# Patient Record
Sex: Male | Born: 1945 | Race: White | Hispanic: No | Marital: Married | Smoking: Former smoker
Health system: Southern US, Community
[De-identification: ages and names within clinical notes are randomized; demographics above are authoritative.]

## PROBLEM LIST (undated history)

## (undated) DIAGNOSIS — K224 Dyskinesia of esophagus: Secondary | ICD-10-CM

## (undated) DIAGNOSIS — E7800 Pure hypercholesterolemia, unspecified: Secondary | ICD-10-CM

## (undated) DIAGNOSIS — K760 Fatty (change of) liver, not elsewhere classified: Secondary | ICD-10-CM

## (undated) DIAGNOSIS — K229 Disease of esophagus, unspecified: Secondary | ICD-10-CM

## (undated) DIAGNOSIS — E119 Type 2 diabetes mellitus without complications: Secondary | ICD-10-CM

## (undated) DIAGNOSIS — K219 Gastro-esophageal reflux disease without esophagitis: Secondary | ICD-10-CM

## (undated) HISTORY — PX: CYSTECTOMY: SUR359

## (undated) HISTORY — DX: Dyskinesia of esophagus: K22.4

## (undated) HISTORY — DX: Pure hypercholesterolemia, unspecified: E78.00

## (undated) HISTORY — DX: Fatty (change of) liver, not elsewhere classified: K76.0

## (undated) HISTORY — DX: Type 2 diabetes mellitus without complications: E11.9

## (undated) HISTORY — DX: Disease of esophagus, unspecified: K22.9

## (undated) HISTORY — DX: Gastro-esophageal reflux disease without esophagitis: K21.9

---

## 1997-07-24 ENCOUNTER — Emergency Department (HOSPITAL_COMMUNITY): Admission: EM | Admit: 1997-07-24 | Discharge: 1997-07-24 | Payer: Self-pay | Admitting: *Deleted

## 1997-07-29 ENCOUNTER — Ambulatory Visit (HOSPITAL_COMMUNITY): Admission: RE | Admit: 1997-07-29 | Discharge: 1997-07-29 | Payer: Self-pay | Admitting: Urology

## 1997-07-30 ENCOUNTER — Emergency Department (HOSPITAL_COMMUNITY): Admission: EM | Admit: 1997-07-30 | Discharge: 1997-07-30 | Payer: Self-pay | Admitting: Emergency Medicine

## 1999-03-10 ENCOUNTER — Ambulatory Visit (HOSPITAL_COMMUNITY): Admission: RE | Admit: 1999-03-10 | Discharge: 1999-03-10 | Payer: Self-pay | Admitting: Family Medicine

## 2001-01-13 ENCOUNTER — Ambulatory Visit (HOSPITAL_COMMUNITY): Admission: RE | Admit: 2001-01-13 | Discharge: 2001-01-13 | Payer: Self-pay | Admitting: Family Medicine

## 2001-01-13 ENCOUNTER — Encounter: Payer: Self-pay | Admitting: Family Medicine

## 2005-02-12 HISTORY — PX: COLONOSCOPY: SHX174

## 2007-02-13 HISTORY — PX: HAND DEBRIDEMENT: SHX974

## 2007-03-10 ENCOUNTER — Encounter: Admission: RE | Admit: 2007-03-10 | Discharge: 2007-03-10 | Payer: Self-pay | Admitting: Otolaryngology

## 2007-07-18 ENCOUNTER — Ambulatory Visit (HOSPITAL_BASED_OUTPATIENT_CLINIC_OR_DEPARTMENT_OTHER): Admission: RE | Admit: 2007-07-18 | Discharge: 2007-07-18 | Payer: Self-pay | Admitting: Orthopedic Surgery

## 2007-07-18 ENCOUNTER — Encounter (INDEPENDENT_AMBULATORY_CARE_PROVIDER_SITE_OTHER): Payer: Self-pay | Admitting: Orthopedic Surgery

## 2010-06-27 NOTE — Op Note (Signed)
[name], [name]             ACCOUNT NO.:  [id number]   MEDICAL RECORD NO.:  [id number]          PATIENT TYPE:  AMB   LOCATION:  DSC                          FACILITY:  [facility]   PHYSICIAN:  [name]. Sypher, M.D. DATE OF BIRTH:  June 12, 1945   DATE OF PROCEDURE:  07/18/2007  DATE OF DISCHARGE:                               OPERATIVE REPORT   PREOPERATIVE DIAGNOSIS:  Painful enlarging mass, left thenar eminence.   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   OPERATION:  Excisional biopsy of vascular mass.   OPERATING SURGEON:  [name]. Sypher, MD   ASSISTANT:  Annye Rusk, PA-C   ANESTHESIA:  General by LMA.   SUPERVISING ANESTHESIOLOGIST:  Janetta Hora. 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  He was referred for  evaluation of a mass in his palm.  He had a purplish area adjacent to  the thenar eminence that was tender.   We explained that this could be a glomus tumor, a hemangioma, or some  other vascular lesion.   Given his proximity to the thenar motor branch, we recommended  excisional biopsy under general anesthesia.   After informed consent, he is brought to the operating room at this  time.   PROCEDURE:  Patrick Luna was brought to the operating room and placed  in supine position upon the operating table.   Following induction of general anesthesia by LMA technique, the left arm  was prepped with Betadine soap solution and sterilely draped.   A pneumatic tourniquet was applied to proximal left brachium.   Upon exsanguination of the left arm with an Esmarch bandage, the  arterial tourniquet was inflated to 230 mmHg.  The procedure commenced  with a 1.5-cm incision directly over the mass.  As the dermis was  parted, we immediately visualized a purplish round mass consistent with  a thrombosed vein or hemangioma.   This was  circumferentially dissected and resected with a normal segment  of vein proximally and distally.  This might have been in extension of  the vein accompanying the superficial palmar arch.   Electrocautery was used to seal the feeding segments of vein.   There were no deeper mass noted.   The wound was then repaired with intradermal 3-0 Prolene and Steri-  Strips.  There were no apparent complications.   For aftercare, Patrick Luna was provided a prescription for Vicodin 5 mg  1 p.o. q.4-6 h. p.r.n. pain.  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      [name] Sypher, M.D.  Electronically Signed     RVS/MEDQ  D:  07/18/2007  T:  07/18/2007  Job:  [id number]

## 2010-08-28 ENCOUNTER — Ambulatory Visit (INDEPENDENT_AMBULATORY_CARE_PROVIDER_SITE_OTHER): Payer: PRIVATE HEALTH INSURANCE | Admitting: Adult Health

## 2010-08-28 ENCOUNTER — Encounter: Payer: Self-pay | Admitting: Adult Health

## 2010-08-28 ENCOUNTER — Other Ambulatory Visit: Payer: Self-pay | Admitting: Adult Health

## 2010-08-28 ENCOUNTER — Ambulatory Visit (HOSPITAL_COMMUNITY)
Admission: RE | Admit: 2010-08-28 | Discharge: 2010-08-28 | Disposition: A | Discharge status: Home / Self Care | Payer: PRIVATE HEALTH INSURANCE | Source: Ambulatory Visit | Attending: Adult Health | Admitting: Adult Health

## 2010-08-28 DIAGNOSIS — R0602 Shortness of breath: Secondary | ICD-10-CM

## 2010-08-28 DIAGNOSIS — E7800 Pure hypercholesterolemia, unspecified: Secondary | ICD-10-CM

## 2010-08-28 DIAGNOSIS — R079 Chest pain, unspecified: Secondary | ICD-10-CM

## 2010-08-28 DIAGNOSIS — R0789 Other chest pain: Secondary | ICD-10-CM | POA: Insufficient documentation

## 2010-08-28 NOTE — Patient Instructions (Signed)
**Note De-Identified Patrick Luna Obfuscation** Your physician has requested that you have an echocardiogram. Echocardiography is a painless test that uses sound waves to create images of your heart. It provides your doctor with information about the size and shape of your heart and how well your heart's chambers and valves are working. This procedure takes approximately one hour. There are no restrictions for this procedure.  Your physician has requested that you have en exercise stress myoview. For further information please visit [email or web address] Please follow instruction sheet, as given.  Your physician recommends that you return for lab work in: today  A chest x-ray takes a picture of the organs and structures inside the chest, including the heart, lungs, and blood vessels. This test can show several things, including, whether the heart is enlarges; whether fluid is building up in the lungs; and whether pacemaker / defibrillator leads are still in place.  Your physician recommends that you schedule a follow-up appointment in: after tests

## 2010-08-28 NOTE — Progress Notes (Signed)
HPI:  We are seeing [name] at the kind request of Dr. Tanya Nones for complaints of chest pain with CVRF. 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  He says that he thought that his adrenalin was pretty high, heart rate was very high, and he experienced chest pain over his left chest, with some under his left axillary area.  This was transient and resolved after the situation was over and he calmed down.He has had no further complaints of recurrent chest pain since that time.  Since that time, however,he has felt more tired and fatigued. He states that climbing up stairs to his office is very tiring and he has to stop to rest at the top of the stairs before proceeding forward.  This concerned him enough to speak with his primary physician about this.  He was sent here for referral and is requested a stress test for evaluation.   In the interim, Dr. Tanya Nones completed labs to include CBC, CMET, TSH, Hgb A1C and cholesterol studies.Marland Kitchen  He was started on pravachol 40mg  and given a low carbohydrate diet.  He states that he is pretty sedentary while at work, but does need to exert himself at times in the field.  No Known Allergies  Current Outpatient Prescriptions  Medication Sig Dispense Refill  . aspirin 81 MG tablet Take 81 mg by mouth daily.        . cetirizine (ZYRTEC) 10 MG tablet Take 10 mg by mouth daily.          Past Medical History  Diagnosis Date  . Hypercholesterolemia     Newly diagnosed in July 2012.  Marland Kitchen Chronic sinusitis     Past Surgical History  Procedure Date  . No past surgeries     ROS: Review of systems complete and found to be negative unless listed above PHYSICAL EXAM BP 145/82  Pulse 80  Ht 5\' 6"  (1.676 m)  Wt 100.245 kg (221 lb)  BMI 35.67 kg/m2  SpO2  96% General: Well developed, well nourished,mildly obese, in no acute distress Head: Eyes PERRLA, No xanthomas.   Normal cephalic and atramatic  Lungs: Clear bilaterally to auscultation and percussion. Heart: HRRR S1 S2, 1/6 systolic murmur at the RSB.  Pulses are 2+ & equal.            No carotid bruit. No JVD.  No abdominal bruits. No femoral bruits. Abdomen: Bowel sounds are positive, abdomen soft and non-tender without masses or                  Hernia's noted. Msk:  Back normal, normal gait. Normal strength and tone for age. Extremities: No clubbing, cyanosis or edema.  DP +1 Neuro: Alert and oriented X 3. Psych:  Good affect, responds appropriately  EKG: NSR  78 bpm.  ASSESSMENT AND PLAN

## 2010-08-28 NOTE — Assessment & Plan Note (Signed)
Dr. Tanya Nones will follow this and he will need to be rechecked in 3 months.

## 2010-08-28 NOTE — Assessment & Plan Note (Addendum)
He has cardiovascular risk factors to include wt, mild hypertension, hypercholesterolemia. He has been seen and examined by myself and by Dr. Dietrich Pates. With symptoms of fatigue and DOE, with transient chest pain, we will plan a exercise stress test, no myoview, along with an echocardiogram. Chest X-ray will be completed along with a D-Dimer and a BNP.    He will follow-up with me or with Dr. Dietrich Pates for discussion of test results and further testing if necessary. Will request TSH results from Dr. Tanya Nones.

## 2010-08-29 LAB — BRAIN NATRIURETIC PEPTIDE: Brain Natriuretic Peptide: 11.3 pg/mL (ref 0.0–100.0)

## 2010-08-30 ENCOUNTER — Encounter: Payer: Self-pay | Admitting: *Deleted

## 2010-08-30 ENCOUNTER — Other Ambulatory Visit: Payer: Self-pay | Admitting: Cardiology

## 2010-09-04 ENCOUNTER — Ambulatory Visit (INDEPENDENT_AMBULATORY_CARE_PROVIDER_SITE_OTHER): Payer: PRIVATE HEALTH INSURANCE | Admitting: *Deleted

## 2010-09-04 ENCOUNTER — Encounter (HOSPITAL_COMMUNITY): Payer: Self-pay

## 2010-09-04 ENCOUNTER — Encounter (HOSPITAL_COMMUNITY)
Admission: RE | Admit: 2010-09-04 | Discharge: 2010-09-04 | Disposition: A | Discharge status: Home / Self Care | Payer: PRIVATE HEALTH INSURANCE | Source: Ambulatory Visit | Attending: Cardiology | Admitting: Cardiology

## 2010-09-04 ENCOUNTER — Ambulatory Visit (HOSPITAL_COMMUNITY)
Admission: RE | Admit: 2010-09-04 | Discharge: 2010-09-04 | Disposition: A | Discharge status: Home / Self Care | Payer: PRIVATE HEALTH INSURANCE | Source: Ambulatory Visit | Attending: Cardiology | Admitting: Cardiology

## 2010-09-04 DIAGNOSIS — R072 Precordial pain: Secondary | ICD-10-CM

## 2010-09-04 DIAGNOSIS — R079 Chest pain, unspecified: Secondary | ICD-10-CM | POA: Insufficient documentation

## 2010-09-04 DIAGNOSIS — R0602 Shortness of breath: Secondary | ICD-10-CM

## 2010-09-04 DIAGNOSIS — E785 Hyperlipidemia, unspecified: Secondary | ICD-10-CM | POA: Insufficient documentation

## 2010-09-04 MED ORDER — TECHNETIUM TC 99M TETROFOSMIN IV KIT
30.0000 | PACK | Freq: Once | INTRAVENOUS | Status: AC | PRN
  Administered 2010-09-04: 28 via INTRAVENOUS

## 2010-09-04 MED ORDER — TECHNETIUM TC 99M TETROFOSMIN IV KIT
10.0000 | PACK | Freq: Once | INTRAVENOUS | Status: AC | PRN
  Administered 2010-09-04: 9.9 via INTRAVENOUS

## 2010-09-04 NOTE — Progress Notes (Signed)
*  PRELIMINARY RESULTS* Echocardiogram 2D Echocardiogram has been performed.  Patrick Luna 09/04/2010, 12:14 PM

## 2010-09-04 NOTE — Progress Notes (Signed)
Stress Lab Nurses Notes - [facility]  ELDEAN NANNA 09/04/2010  Reason for doing test: Chest Pain and Dyspnea  Type of test: Stress Myoview  Nurse performing test: Parke Poisson, RN  Nuclear Medicine Tech: Lyndel Pleasure  Echo Tech: Not Applicable  MD performing test: R. Rothbart  Family MD: Karrie Doffing  Test explained and consent signed: yes  IV started: 22g jelco, Saline lock flushed, No redness or edema and Saline lock started in radiology  Symptoms: SOB  Treatment/Intervention: None  Reason test stopped: fatigue and SOB  After recovery IV was: Discontinued via X-ray tech and No redness or edema  Patient to return to Nuc. Med at : 11:20am  Patient discharged: Home  Patient's Condition upon discharge was: stable  Comments: Symptom resolved in recovery. Erskine Speed T

## 2010-09-04 NOTE — Progress Notes (Deleted)

## 2010-09-08 ENCOUNTER — Encounter: Payer: Self-pay | Admitting: Adult Health

## 2010-09-08 ENCOUNTER — Ambulatory Visit (INDEPENDENT_AMBULATORY_CARE_PROVIDER_SITE_OTHER): Payer: PRIVATE HEALTH INSURANCE | Admitting: Adult Health

## 2010-09-08 DIAGNOSIS — R079 Chest pain, unspecified: Secondary | ICD-10-CM

## 2010-09-08 NOTE — Assessment & Plan Note (Signed)
Review of stress test demonstrates: "Negative stress nuclear myocardial study revealing good exercise capacity, a normal stress EKG, normal left ventricular size, normal left ventricular systolic function and normal myocardial perfusion.  Echo:Left ventricle: The cavity size was normal. Borderline left ventricular hypertrophy. Systolic function was normal. The estimated ejection fraction was in the range of 55% to 60%. Wall motion was normal; there were no regional wall motion abnormalities. Aortic valve: Mildly calcified annulus. Trileaflet; normal thickness leaflets. Mitral valve: Calcified annulus. Atrial septum: No defect or patent foramen ovale was identified.  I have reviewed these test results with the patient and have given him copies of the reports. I have offered NTG sublingual to assist for recurrent pain which may be adrenalin induced, or coronary spasms. He refused at this time. He is to call if he changes his mind. We will see him on a prn basis only.  He will follow with Dr. Tanya Nones.

## 2010-09-08 NOTE — Patient Instructions (Signed)
Follow up as needed  Continue current medications  

## 2010-09-08 NOTE — Progress Notes (Signed)
HPI:  We are seeing [name] at the kind request of Dr. Tanya Nones for complaints of chest pain with CVRF. 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  He says that he thought that his adrenalin was pretty high, heart rate was very high, and he experienced chest pain over his left chest, with some under his left axillary area.  This was transient and resolved after the situation was over and he calmed down.He has had no further complaints of recurrent chest pain since that time.  Since that time, however,he has felt more tired and fatigued. He states that climbing up stairs to his office is very tiring and he has to stop to rest at the top of the stairs before proceeding forward.  This concerned him enough to speak with his primary physician about this.  He was sent here for referral and is requested a stress test for evaluation.   He was planned for stress test and Echo on last visit. He is here for discussion of results.  He has had no recurrence of chest pain since last visit. Fatigue remains.  Allergies  Allergen Reactions  . Ampicillin Rash    Current Outpatient Prescriptions  Medication Sig Dispense Refill  . aspirin 81 MG tablet Take 81 mg by mouth daily.        . cetirizine (ZYRTEC) 10 MG tablet Take 10 mg by mouth daily.          Past Medical History  Diagnosis Date  . Hypercholesterolemia     Newly diagnosed in July 2012.  Marland Kitchen Chronic sinusitis     Past Surgical History  Procedure Date  . No past surgeries     ROS: Review of systems complete and found to be negative unless listed above PHYSICAL EXAM BP 129/83  Pulse 66  Ht 5\' 6"  (1.676 m)  Wt 221 lb (100.245 kg)  BMI 35.67 kg/m2  SpO2 96% General: Well developed, well nourished,mildly obese, in no acute  distress Lungs: Clear bilaterally to auscultation and percussion. Heart: HRRR S1 S2, 1/6 systolic murmur at the RSB.  Pulses are 2+ & equal.            No carotid bruit. No JVD.  No abdominal bruits. No femoral bruits. Psych:  Good affect, responds appropriately    ASSESSMENT AND PLAN

## 2010-09-11 ENCOUNTER — Encounter: Payer: Self-pay | Admitting: *Deleted

## 2010-09-18 ENCOUNTER — Telehealth: Payer: Self-pay | Admitting: *Deleted

## 2010-09-18 NOTE — Telephone Encounter (Signed)
Pt had a 20 minute window of palpitations the weekend after his stress test without any lasting effects Wanted staff at the office to note in his chart

## 2010-09-26 ENCOUNTER — Encounter: Payer: Self-pay | Admitting: Adult Health

## 2011-09-03 ENCOUNTER — Other Ambulatory Visit: Payer: Self-pay | Admitting: Gastroenterology

## 2012-02-18 ENCOUNTER — Other Ambulatory Visit: Payer: Self-pay | Admitting: Family Medicine

## 2012-02-18 NOTE — Telephone Encounter (Signed)
Please pull chart.

## 2012-02-19 NOTE — Telephone Encounter (Signed)
Chart pulled to PA pool at nurses station DOS 01/07/11

## 2012-04-14 ENCOUNTER — Other Ambulatory Visit: Payer: Self-pay | Admitting: Dermatology

## 2012-09-29 ENCOUNTER — Other Ambulatory Visit: Payer: Self-pay | Admitting: Gastroenterology

## 2012-09-29 ENCOUNTER — Ambulatory Visit (INDEPENDENT_AMBULATORY_CARE_PROVIDER_SITE_OTHER): Payer: PRIVATE HEALTH INSURANCE | Admitting: Physician Assistant

## 2012-09-29 ENCOUNTER — Encounter: Payer: Self-pay | Admitting: Physician Assistant

## 2012-09-29 VITALS — BP 110/72 | HR 78 | Temp 98.2°F | Resp 18 | Ht 66.0 in | Wt 220.0 lb

## 2012-09-29 DIAGNOSIS — J988 Other specified respiratory disorders: Secondary | ICD-10-CM

## 2012-09-29 DIAGNOSIS — K224 Dyskinesia of esophagus: Secondary | ICD-10-CM

## 2012-09-29 DIAGNOSIS — A499 Bacterial infection, unspecified: Secondary | ICD-10-CM

## 2012-09-29 MED ORDER — ALBUTEROL SULFATE HFA 108 (90 BASE) MCG/ACT IN AERS: 2.0000 | INHALATION_SPRAY | Freq: Four times a day (QID) | RESPIRATORY_TRACT | Status: DC | PRN

## 2012-09-29 MED ORDER — AZITHROMYCIN 250 MG PO TABS: ORAL_TABLET | ORAL | Status: DC

## 2012-09-29 MED ORDER — HYDROCOD POLST-CHLORPHEN POLST 10-8 MG/5ML PO LQCR: 5.0000 mL | Freq: Two times a day (BID) | ORAL | Status: DC | PRN

## 2012-09-29 NOTE — Progress Notes (Signed)
   Patient ID: AUBERY DOUTHAT MRN: [id number], DOB: 07-29-1945, 67 y.o. Date of Encounter: 09/29/2012, 3:13 PM    Chief Complaint:  Chief Complaint  Patient presents with  . ? Bronchitis     HPI: 67 y.o. year old white male here with complaints of congestion. He states that symptoms started approximately one week ago with nasal congestion and drainage down his throat. Now he is having lots of cough productive of thick dark phlegm. He states that he quit smoking back in 1970. However he thinks that he is having some episodes of wheezing at night. Last night he has to sleep sitting up secondary to recurrent cough and what he reports is occasional wheezing. He's had no significant sore throat ear pain or fevers or chills.     Home Meds: See attached medication section for any medications that were entered at today's visit. The computer does not put those onto this list.The following list is a list of meds entered prior to today's visit.   Current Outpatient Prescriptions on File Prior to Visit  Medication Sig Dispense Refill  . aspirin 81 MG tablet Take 81 mg by mouth daily.        . cetirizine (ZYRTEC) 10 MG tablet Take 10 mg by mouth daily.         No current facility-administered medications on file prior to visit.    Allergies:  Allergies  Allergen Reactions  . Ampicillin Rash      Review of Systems: See HPI for pertinent ROS. All other ROS negative.    Physical Exam: Blood pressure 110/72, pulse 78, temperature 98.2 F (36.8 C), temperature source Oral, resp. rate 18, height 5\' 6"  (1.676 m), weight 220 lb (99.791 kg)., Body mass index is 35.53 kg/(m^2). General: well-nourished well-developed white male Appears in no acute distress. HEENT: Normocephalic, atraumatic, eyes without discharge, sclera non-icteric, nares are without discharge. Bilateral auditory canals clear, TM's are without perforation, pearly grey and translucent with reflective cone of light bilaterally. Oral  cavity moist, posterior pharynx without exudate, erythema, peritonsillar abscess.  Neck: Supple. No thyromegaly. No lymphadenopathy. Lungs: Clear bilaterally to auscultation without wheezes, rales, or rhonchi. Breathing is unlabored. Heart: Regular rhythm. No murmurs, rubs, or gallops. Msk:  Strength and tone normal for age. Extremities/Skin: Warm and dry.  No edema. No rashes or suspicious lesions. Neuro: Alert and oriented X 3. Moves all extremities spontaneously. Gait is normal. CNII-XII grossly in tact. Psych:  Responds to questions appropriately with a normal affect.     ASSESSMENT AND PLAN:  67 y.o. year old male with  1. Bacterial respiratory infection - azithromycin (ZITHROMAX) 250 MG tablet; Day 1: Take 2 daily.  Days 2-5: Take 1 daily  Dispense: 6 tablet; Refill: 0 - chlorpheniramine-HYDROcodone (TUSSIONEX) 10-8 MG/5ML LQCR; Take 5 mL by mouth every 12 (twelve) hours as needed.  Dispense: 140 mL; Refill: 0 - albuterol (PROVENTIL HFA;VENTOLIN HFA) 108 (90 BASE) MCG/ACT inhaler; Inhale 2 puffs into the lungs every 6 (six) hours as needed for wheezing.  Dispense: 1 Inhaler; Refill: 0   Signed, [address] [place], [place], [facility] 09/29/2012 3:13 PM

## 2012-10-01 ENCOUNTER — Other Ambulatory Visit: Payer: PRIVATE HEALTH INSURANCE

## 2012-10-06 ENCOUNTER — Ambulatory Visit
Admission: RE | Admit: 2012-10-06 | Discharge: 2012-10-06 | Disposition: A | Discharge status: Home / Self Care | Payer: PRIVATE HEALTH INSURANCE | Source: Ambulatory Visit | Attending: Gastroenterology | Admitting: Gastroenterology

## 2012-10-06 DIAGNOSIS — K224 Dyskinesia of esophagus: Secondary | ICD-10-CM

## 2012-12-04 ENCOUNTER — Other Ambulatory Visit: Payer: Self-pay | Admitting: Gastroenterology

## 2012-12-04 DIAGNOSIS — R1013 Epigastric pain: Secondary | ICD-10-CM

## 2012-12-08 ENCOUNTER — Ambulatory Visit
Admission: RE | Admit: 2012-12-08 | Discharge: 2012-12-08 | Disposition: A | Discharge status: Home / Self Care | Payer: PRIVATE HEALTH INSURANCE | Source: Ambulatory Visit | Attending: Gastroenterology | Admitting: Gastroenterology

## 2012-12-08 DIAGNOSIS — R1013 Epigastric pain: Secondary | ICD-10-CM

## 2012-12-31 ENCOUNTER — Encounter: Payer: Self-pay | Admitting: *Deleted

## 2012-12-31 DIAGNOSIS — K219 Gastro-esophageal reflux disease without esophagitis: Secondary | ICD-10-CM | POA: Insufficient documentation

## 2013-01-02 ENCOUNTER — Encounter: Payer: Self-pay | Admitting: *Deleted

## 2013-01-02 ENCOUNTER — Encounter: Payer: Self-pay | Admitting: Cardiology

## 2013-01-02 ENCOUNTER — Ambulatory Visit (INDEPENDENT_AMBULATORY_CARE_PROVIDER_SITE_OTHER): Payer: PRIVATE HEALTH INSURANCE | Admitting: Cardiology

## 2013-01-02 VITALS — BP 123/70 | HR 72 | Ht 66.0 in | Wt 222.0 lb

## 2013-01-02 DIAGNOSIS — R079 Chest pain, unspecified: Secondary | ICD-10-CM

## 2013-01-02 DIAGNOSIS — K219 Gastro-esophageal reflux disease without esophagitis: Secondary | ICD-10-CM

## 2013-01-02 DIAGNOSIS — R0789 Other chest pain: Secondary | ICD-10-CM

## 2013-01-02 DIAGNOSIS — E7800 Pure hypercholesterolemia, unspecified: Secondary | ICD-10-CM

## 2013-01-02 NOTE — Progress Notes (Signed)
Clinical Summary Patrick Luna is a 67 y.o.male referred for cardiology consultation by Dr. Matthias Hughs with [facility]. Records indicate treatment for chest discomfort with painful swallowing and nausea, on PPI. He tells me that he has been experiencing symptoms since August. Describes a dull ache in his epigastric to upper sternal area, usually has a feeling of painful or difficult swallowing, nausea, sometimes dry heaves. Symptoms have no precipitant. Sometimes he gets up at night with these symptoms. Has not yet noted major improvement on the current regimen. He is scheduled for an EGD on Monday.  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 He reports no exertional chest pain or progressive shortness of breath. ECG today is normal.  Recent lab work shows hemoglobin 15.3, platelets 179. Chest x-ray from August was unremarkable. Abdominal ultrasound in October demonstrated normal gallbladder, hepatic steatosis.  Patient was seen by Ms. Lawrence NP back in 2012 due to chest pain and underwent an exercise Myoview that was negative for ischemia with LVEF 62%.   Allergies  Allergen Reactions  . Ampicillin Rash    Current Outpatient Prescriptions  Medication Sig Dispense Refill  . aspirin 81 MG tablet Take 81 mg by mouth daily.        Marland Kitchen b complex vitamins capsule Take 1 capsule by mouth daily.      . cetirizine (ZYRTEC) 10 MG tablet Take 10 mg by mouth daily.        . chlorpheniramine-HYDROcodone (TUSSIONEX) 10-8 MG/5ML LQCR Take 5 mL by mouth every 12 (twelve) hours as needed.  140 mL  0  . esomeprazole (NEXIUM) 20 MG packet Take 20 mg by mouth daily before breakfast.       No current facility-administered medications for this visit.    Past Medical History  Diagnosis Date  . Hypercholesterolemia   . Chronic sinusitis   . Hepatic steatosis   . GERD (gastroesophageal reflux disease)     Past Surgical History  Procedure Laterality Date  . Colonoscopy  2007  .  Cystectomy      BENIGN CYST REMOVAL FROM RIGHT SHOULDER    Family History  Problem Relation Age of Onset  . Hypertension Mother   . Hepatitis C Sister     Social History Mr. Scheibel reports that he quit smoking about 44 years ago. His smoking use included Cigarettes. He smoked 0.00 packs per day. He has never used smokeless tobacco. Mr. Dibello reports that he does not drink alcohol.  Review of Systems No palpitations, dizziness, syncope. No orthopnea or PND. No claudication. No leg edema. Otherwise negative.  Physical Examination Filed Vitals:   01/02/13 1312  BP: 123/70  Pulse: 72   Filed Weights   01/02/13 1312  Weight: 222 lb (100.699 kg)   Overweight male, appears comfortable at rest. HEENT: Conjunctiva and lids normal, oropharynx clear. Neck: Supple, no elevated JVP or carotid bruits, no thyromegaly. Lungs: Clear to auscultation, nonlabored breathing at rest. Cardiac: Regular rate and rhythm, no S3 or significant systolic murmur, no pericardial rub. Abdomen: Soft, nontender, bowel sounds present, no guarding or rebound. Extremities: No pitting edema, distal pulses 2+. Skin: Warm and dry. Musculoskeletal: No kyphosis. Neuropsychiatric: Alert and oriented x3, affect grossly appropriate.   Problem List and Plan   Atypical chest pain Symptoms as reported above, do seem potentially GI in origin, possibly related to esophageal spasm. He has not yet had improvement on PPI however, and is scheduled for an EGD on Monday. He is  referred for followup cardiac testing as well. Ischemic workup in 2012 was unrevealing. His current symptoms are more intense and progressive, yet still atypical for ischemia. Will plan a followup GXT and inform him of the results. He should go ahead and proceed with EGD as planned. If the feeling is that he does have esophageal spasm after further workup, consideration could be given to using low-dose Norvasc or even a  nitrate.  Hypercholesteremia Diet managed, followed by Dr. Tanya Nones.  GERD (gastroesophageal reflux disease) Presently on Nexium.    Jonelle Sidle, M.D., F.A.C.C.

## 2013-01-02 NOTE — Patient Instructions (Addendum)
Your physician recommends that you schedule a follow-up appointment As needed  Your physician has requested that you have an exercise tolerance test. For further information please visit [email or web address]. Please also follow instruction sheet, as given.   

## 2013-01-02 NOTE — Assessment & Plan Note (Signed)
Presently on Nexium.

## 2013-01-02 NOTE — Assessment & Plan Note (Signed)
Symptoms as reported above, do seem potentially GI in origin, possibly related to esophageal spasm. He has not yet had improvement on PPI however, and is scheduled for an EGD on Monday. He is referred for followup cardiac testing as well. Ischemic workup in 2012 was unrevealing. His current symptoms are more intense and progressive, yet still atypical for ischemia. Will plan a followup GXT and inform him of the results. He should go ahead and proceed with EGD as planned. If the feeling is that he does have esophageal spasm after further workup, consideration could be given to using low-dose Norvasc or even a nitrate.

## 2013-01-02 NOTE — Assessment & Plan Note (Signed)
Diet managed, followed by Dr. Tanya Nones.

## 2013-01-06 ENCOUNTER — Other Ambulatory Visit (HOSPITAL_COMMUNITY): Payer: Self-pay | Admitting: Gastroenterology

## 2013-01-06 ENCOUNTER — Encounter: Payer: Self-pay | Admitting: Cardiology

## 2013-01-06 DIAGNOSIS — R1013 Epigastric pain: Secondary | ICD-10-CM

## 2013-01-13 ENCOUNTER — Ambulatory Visit (HOSPITAL_COMMUNITY)
Admission: RE | Admit: 2013-01-13 | Discharge: 2013-01-13 | Disposition: A | Discharge status: Home / Self Care | Payer: PRIVATE HEALTH INSURANCE | Source: Ambulatory Visit | Attending: Cardiology | Admitting: Cardiology

## 2013-01-13 ENCOUNTER — Encounter: Payer: Self-pay | Admitting: Cardiology

## 2013-01-13 DIAGNOSIS — R079 Chest pain, unspecified: Secondary | ICD-10-CM | POA: Insufficient documentation

## 2013-01-13 DIAGNOSIS — R0789 Other chest pain: Secondary | ICD-10-CM

## 2013-01-13 NOTE — Progress Notes (Signed)
Stress Lab Nurses Notes - Patrick Luna  Patrick Luna 01/13/2013 Reason for doing test: Chest Pain Type of test: Regular GTX Nurse performing test: Parke Poisson, RN Nuclear Medicine Tech: Not Applicable Echo Tech: Not Applicable MD performing test: Ival Bible / K. Lawrence NP Family MD: Dr. Tanya Nones Test explained and consent signed: yes IV started: No IV started Symptoms: Fatigue in legs Treatment/Intervention: None Reason test stopped: reached target HR After recovery IV was: NA Patient to return to Nuc. Med at : NA Patient discharged: Home Patient's Condition upon discharge was: stable Comments: During test peak BP 199/85  & HR 164.  Recovery BP 138/78 & HR 100.  Symptoms resolved in recovery. Erskine Speed T

## 2013-01-13 NOTE — Progress Notes (Signed)
Stress Lab Nurses Notes - [facility]   GAVYNN DUVALL  01/13/2013  Reason for doing test: Chest Pain  Type of test: Regular GTX  Nurse performing test: Parke Poisson, RN  MD performing test: Ival Bible / K. Lawrence NP  Family MD: Dr. Tanya Nones  Test explained and consent signed: Yes  IV started: No IV started  Symptoms: Fatigue in legs  Treatment/Intervention: None  Reason test stopped: Reached target HR  After recovery IV was: NA  Patient discharged: Home  Patient's Condition upon discharge was: Stable  Comments: During test peak BP 199/85 & HR 164. Recovery BP 138/78 & HR 100. Symptoms resolved in recovery.  Erskine Speed T  Attending note:  Patient exercised on Bruce protocol reaching 10.4 METS, peak heart rate 164 BPM, exceeded 85% MPHR. No chest pain reported. No diagnostic ST segment depression or arrhythmias. Hypertensive response.  Jonelle Sidle, M.D., F.A.C.C.

## 2013-01-14 ENCOUNTER — Encounter: Payer: Self-pay | Admitting: Physician Assistant

## 2013-01-14 ENCOUNTER — Ambulatory Visit (INDEPENDENT_AMBULATORY_CARE_PROVIDER_SITE_OTHER): Payer: PRIVATE HEALTH INSURANCE | Admitting: Physician Assistant

## 2013-01-14 VITALS — BP 126/80 | HR 76 | Temp 98.4°F | Resp 18 | Wt 222.0 lb

## 2013-01-14 DIAGNOSIS — R739 Hyperglycemia, unspecified: Secondary | ICD-10-CM

## 2013-01-14 DIAGNOSIS — B9789 Other viral agents as the cause of diseases classified elsewhere: Secondary | ICD-10-CM

## 2013-01-14 DIAGNOSIS — R7309 Other abnormal glucose: Secondary | ICD-10-CM

## 2013-01-14 NOTE — Progress Notes (Signed)
    Patient ID: COSTA JHA MRN: [id number], DOB: 1945-06-10, 67 y.o. Date of Encounter: 01/14/2013, 10:27 AM    Chief Complaint:  Chief Complaint  Patient presents with  . congestion, laryngitis    also concerned about elevated blood sugars, not fasting     HPI: 67 y.o. year old male reports that symptoms just started yesterday. Occasional prodauctive cough. Scratchy throat. Last night, lost his voice. This morning, has voice-slightly hoarse. No fever. No nasal cong/mucus.  Also reports he has been seeing Dr. Clent Ridges about a Esophagus problem. During the eval, he did blood work. Glucose was 184. Dr. Warrick Parisian told him to f/u with [place] about it. Pt says that lab was drawn within one hour of him eating Pancakes. !!     Home Meds: See attached medication section for any medications that were entered at today's visit. The computer does not put those onto this list.The following list is a list of meds entered prior to today's visit.   Current Outpatient Prescriptions on File Prior to Visit  Medication Sig Dispense Refill  . aspirin 81 MG tablet Take 81 mg by mouth daily.        Marland Kitchen b complex vitamins capsule Take 1 capsule by mouth daily.      . cetirizine (ZYRTEC) 10 MG tablet Take 10 mg by mouth daily.        . chlorpheniramine-HYDROcodone (TUSSIONEX) 10-8 MG/5ML LQCR Take 5 mL by mouth every 12 (twelve) hours as needed.  140 mL  0   No current facility-administered medications on file prior to visit.    Allergies:  Allergies  Allergen Reactions  . Ampicillin Rash      Review of Systems: See HPI for pertinent ROS. All other ROS negative.    Physical Exam: Blood pressure 126/80, pulse 76, temperature 98.4 F (36.9 C), temperature source Oral, resp. rate 18, weight 222 lb (100.699 kg)., Body mass index is 35.85 kg/(m^2). General: Obese WM.  Appears in no acute distress. HEENT: Normocephalic, atraumatic, eyes without discharge, sclera non-icteric, nares are without discharge.  Bilateral auditory canals clear, TM's are without perforation, pearly grey and translucent with reflective cone of light bilaterally. Oral cavity moist, posterior pharynx without exudate, erythema, peritonsillar abscess, or post nasal drip.  Neck: Supple. No thyromegaly. No lymphadenopathy.No tenderness with percussion of sinuses. Throat Normal.  Lungs: Clear bilaterally to auscultation without wheezes, rales, or rhonchi. Breathing is unlabored. Heart: Regular rhythm. No murmurs, rubs, or gallops. Msk:  Strength and tone normal for age. Extremities/Skin: Warm and dry. No clubbing or cyanosis. No edema. No rashes or suspicious lesions. Neuro: Alert and oriented X 3. Moves all extremities spontaneously. Gait is normal. CNII-XII grossly in tact. Psych:  Responds to questions appropriately with a normal affect.     ASSESSMENT AND PLAN:  67 y.o. year old male with  1. Hyperglycemia - Glucose, fingerstick (stat):  Pt has eaten this morning: 3 hours ago: Sausage, Tomatoe, Cheese, Biscuit, Coffee - Hemoglobin A1C, fingerstick  2. Viral respiratory infection Symtomatic treatment. Should spont resolve within one week. If not, f/u Note given for oow today, tomorrow.    Signed, [address] [place], [place], [facility] 01/14/2013 10:27 AM

## 2013-01-14 NOTE — Progress Notes (Signed)
GXT report reviewed. Negative for ischemia or arrhythmia at adequate heart rate response. No further cardiac workup planned now.

## 2013-01-15 ENCOUNTER — Encounter (HOSPITAL_COMMUNITY): Payer: Self-pay

## 2013-01-15 ENCOUNTER — Encounter (HOSPITAL_COMMUNITY)
Admission: RE | Admit: 2013-01-15 | Discharge: 2013-01-15 | Disposition: A | Discharge status: Home / Self Care | Payer: PRIVATE HEALTH INSURANCE | Source: Ambulatory Visit | Attending: Gastroenterology | Admitting: Gastroenterology

## 2013-01-15 DIAGNOSIS — R1013 Epigastric pain: Secondary | ICD-10-CM | POA: Insufficient documentation

## 2013-01-15 MED ORDER — SINCALIDE 5 MCG IJ SOLR
INTRAMUSCULAR | Status: AC
  Administered 2013-01-15: 2 ug via INTRAVENOUS
  Filled 2013-01-15: qty 5

## 2013-01-15 MED ORDER — STERILE WATER FOR INJECTION IJ SOLN
INTRAMUSCULAR | Status: AC
  Administered 2013-01-15: 2 mL via INTRAVENOUS
  Filled 2013-01-15: qty 10

## 2013-01-15 MED ORDER — TECHNETIUM TC 99M MEBROFENIN IV KIT
5.0000 | PACK | Freq: Once | INTRAVENOUS | Status: AC | PRN
  Administered 2013-01-15: 5 via INTRAVENOUS

## 2013-02-02 ENCOUNTER — Encounter (HOSPITAL_COMMUNITY)
Admission: RE | Disposition: A | Discharge status: Home / Self Care | Payer: Self-pay | Source: Ambulatory Visit | Attending: Gastroenterology

## 2013-02-02 ENCOUNTER — Ambulatory Visit (HOSPITAL_COMMUNITY)
Admission: RE | Admit: 2013-02-02 | Discharge: 2013-02-02 | Disposition: A | Discharge status: Home / Self Care | Payer: PRIVATE HEALTH INSURANCE | Source: Ambulatory Visit | Attending: Gastroenterology | Admitting: Gastroenterology

## 2013-02-02 DIAGNOSIS — K220 Achalasia of cardia: Secondary | ICD-10-CM | POA: Diagnosis not present

## 2013-02-02 DIAGNOSIS — R079 Chest pain, unspecified: Secondary | ICD-10-CM | POA: Diagnosis present

## 2013-02-02 HISTORY — PX: ESOPHAGEAL MANOMETRY: SHX5429

## 2013-02-02 SURGERY — MANOMETRY, ESOPHAGUS

## 2013-02-02 MED ORDER — LIDOCAINE VISCOUS 2 % MT SOLN
OROMUCOSAL | Status: AC
  Filled 2013-02-02: qty 15

## 2013-02-03 ENCOUNTER — Encounter (HOSPITAL_COMMUNITY): Payer: Self-pay | Admitting: Gastroenterology

## 2013-03-04 DIAGNOSIS — R1310 Dysphagia, unspecified: Secondary | ICD-10-CM | POA: Insufficient documentation

## 2013-04-02 ENCOUNTER — Encounter: Payer: Self-pay | Admitting: Family Medicine

## 2013-07-13 ENCOUNTER — Other Ambulatory Visit: Payer: Self-pay | Admitting: Physician Assistant

## 2013-07-20 ENCOUNTER — Encounter (HOSPITAL_BASED_OUTPATIENT_CLINIC_OR_DEPARTMENT_OTHER): Payer: Self-pay | Admitting: *Deleted

## 2013-07-20 NOTE — Progress Notes (Signed)
No labs needed

## 2013-07-22 NOTE — H&P (Signed)
[facility] [address] [place], [place] [address] [phone] A Division of [facility]  Ninetta Lights, M.D.   Robert A. Noemi Chapel, M.D.   Faythe Casa, M.D.   Johnny Bridge, M.D.   Almedia Balls, M.D Ernesta Amble. Percell Miller, M.D.  Joseph Pierini, M.D.  Lanier Prude, M.D.    Verner Chol, M.D. Mary L. Fenton Malling, PA-C  Kirstin A. Shepperson, PA-C  Josh [place], PA-C [place], [place]   RE: Patrick Luna, Patrick Luna                                [id number]      DOB: 01/26/1946 PROGRESS NOTE: 06-23-13 Britton comes in for a new issue.  Right heel.  He has had symptoms that have been getting worse over the last two years.  He had in the past some plantar fasciitis symptoms that resolved, a number of years ago.  Last two years however he has had more and more pain over the posterior aspect of his heel.  Markedly enlarging spur.  More and more functional impact.  Pain and swelling the more he is on his feet.  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  Currently no plantar fascia symptoms, everything is over the posterior aspect of his heel.  No numbness.  No tingling.  No intervention there.   His entire history and general exam is reviewed, updated and included in the chart.    EXAMINATION: Specifically, healthy appearing 68 year-old male.  A little bit of an antalgic gait on the right.  Markedly prominent Achilles attachment spur posterior aspect, right heel.  This is very prominent posteriorly.  Can only dorsiflex 0 degrees with the knee extended.  Tightness and pain up along the Achilles, but this is intact, but very tender right at the attachment.  A little tender over his plantar fascia attachment, but nothing really marked there.  Neurovascularly intact otherwise.  Good ankle motion and stability.    X-RAYS: X-rays show extensive attachment spurring, loose bodies at the Achilles attachment  posterior aspect of the os calcis.  A small spur down at the plantar fascia attachment.  No evidence of stress fracture.    IMPRESSION: Prominent pump bump and marked Achilles attachment spurring, os calcis, right heel.  From what he is telling me it sounds like it is getting worse and he is starting to rip and tear the Achilles at the attachment itself.  PLAN: At this point in time there is really nothing short of operative intervention that is going to help.  More than 25 minutes spent face-to-face covering this with him.  I have told him what is involved. General anesthesia.  Prone position.  Excision of all the attachment spurs, debridement of the Achilles tendon and then reattachment with an Arthrex SpeedBridge technique.  Outpatient.  Limited weight bearing first week and then ambulate in a boot until healed.  He can return to deskwork in one week, but it is going to be a full 8-10 weeks before he is back to full activity.  He certainly has enough symptoms to warrant this.  I don't think a course of therapy or other modalities are going to help.  He understands and agrees.  We are going to proceed with operative intervention  Ninetta Lights, M.D.   Electronically verified by Quillian Quince  Dennie Bible, M.D. DFM:jjh D 06-23-13 T 06-24-13

## 2013-07-23 ENCOUNTER — Encounter (HOSPITAL_BASED_OUTPATIENT_CLINIC_OR_DEPARTMENT_OTHER): Payer: Self-pay | Admitting: Anesthesiology

## 2013-07-23 ENCOUNTER — Ambulatory Visit (HOSPITAL_BASED_OUTPATIENT_CLINIC_OR_DEPARTMENT_OTHER): Payer: PRIVATE HEALTH INSURANCE | Admitting: Anesthesiology

## 2013-07-23 ENCOUNTER — Encounter (HOSPITAL_BASED_OUTPATIENT_CLINIC_OR_DEPARTMENT_OTHER)
Admission: RE | Disposition: A | Discharge status: Home / Self Care | Payer: Self-pay | Source: Ambulatory Visit | Attending: Orthopedic Surgery

## 2013-07-23 ENCOUNTER — Encounter (HOSPITAL_BASED_OUTPATIENT_CLINIC_OR_DEPARTMENT_OTHER): Payer: PRIVATE HEALTH INSURANCE | Admitting: Anesthesiology

## 2013-07-23 ENCOUNTER — Ambulatory Visit (HOSPITAL_BASED_OUTPATIENT_CLINIC_OR_DEPARTMENT_OTHER)
Admission: RE | Admit: 2013-07-23 | Discharge: 2013-07-23 | Disposition: A | Discharge status: Home / Self Care | Payer: PRIVATE HEALTH INSURANCE | Source: Ambulatory Visit | Attending: Orthopedic Surgery | Admitting: Orthopedic Surgery

## 2013-07-23 DIAGNOSIS — M898X9 Other specified disorders of bone, unspecified site: Secondary | ICD-10-CM | POA: Insufficient documentation

## 2013-07-23 DIAGNOSIS — S93499A Sprain of other ligament of unspecified ankle, initial encounter: Secondary | ICD-10-CM | POA: Insufficient documentation

## 2013-07-23 DIAGNOSIS — Y929 Unspecified place or not applicable: Secondary | ICD-10-CM | POA: Insufficient documentation

## 2013-07-23 DIAGNOSIS — S96819A Strain of other specified muscles and tendons at ankle and foot level, unspecified foot, initial encounter: Secondary | ICD-10-CM

## 2013-07-23 DIAGNOSIS — M7660 Achilles tendinitis, unspecified leg: Secondary | ICD-10-CM | POA: Insufficient documentation

## 2013-07-23 DIAGNOSIS — X58XXXA Exposure to other specified factors, initial encounter: Secondary | ICD-10-CM | POA: Insufficient documentation

## 2013-07-23 HISTORY — PX: EXCISION HAGLUND'S DEFORMITY WITH ACHILLES TENDON REPAIR: SHX5627

## 2013-07-23 SURGERY — EXCISION HAGLUND'S DEFORMITY WITH ACHILLES TENDON REPAIR
Anesthesia: General | Site: Foot | Laterality: Right

## 2013-07-23 MED ORDER — BUPIVACAINE HCL (PF) 0.25 % IJ SOLN
INTRAMUSCULAR | Status: DC | PRN
  Administered 2013-07-23: 20 mL via PERINEURAL

## 2013-07-23 MED ORDER — CLINDAMYCIN PHOSPHATE 900 MG/50ML IV SOLN
900.0000 mg | INTRAVENOUS | Status: AC
  Administered 2013-07-23: 900 mg via INTRAVENOUS

## 2013-07-23 MED ORDER — PROPOFOL 10 MG/ML IV BOLUS
INTRAVENOUS | Status: DC | PRN
  Administered 2013-07-23: 200 mg via INTRAVENOUS

## 2013-07-23 MED ORDER — FENTANYL CITRATE 0.05 MG/ML IJ SOLN
INTRAMUSCULAR | Status: AC
  Filled 2013-07-23: qty 2

## 2013-07-23 MED ORDER — FENTANYL CITRATE 0.05 MG/ML IJ SOLN
INTRAMUSCULAR | Status: AC
  Filled 2013-07-23: qty 4

## 2013-07-23 MED ORDER — HYDROMORPHONE HCL PF 1 MG/ML IJ SOLN: 0.2500 mg | INTRAMUSCULAR | Status: DC | PRN

## 2013-07-23 MED ORDER — MIDAZOLAM HCL 2 MG/2ML IJ SOLN
INTRAMUSCULAR | Status: AC
  Filled 2013-07-23: qty 2

## 2013-07-23 MED ORDER — LACTATED RINGERS IV SOLN
INTRAVENOUS | Status: DC
  Administered 2013-07-23: 07:00:00 via INTRAVENOUS

## 2013-07-23 MED ORDER — ONDANSETRON HCL 4 MG PO TABS: 4.0000 mg | ORAL_TABLET | Freq: Three times a day (TID) | ORAL | Status: DC | PRN

## 2013-07-23 MED ORDER — METOCLOPRAMIDE HCL 5 MG/ML IJ SOLN: 10.0000 mg | Freq: Once | INTRAMUSCULAR | Status: DC | PRN

## 2013-07-23 MED ORDER — BUPIVACAINE HCL (PF) 0.5 % IJ SOLN
INTRAMUSCULAR | Status: DC | PRN
  Administered 2013-07-23: 20 mL via PERINEURAL

## 2013-07-23 MED ORDER — CLINDAMYCIN PHOSPHATE 900 MG/50ML IV SOLN
INTRAVENOUS | Status: AC
  Filled 2013-07-23: qty 50

## 2013-07-23 MED ORDER — DEXAMETHASONE SODIUM PHOSPHATE 4 MG/ML IJ SOLN
INTRAMUSCULAR | Status: DC | PRN
  Administered 2013-07-23: 10 mg via INTRAVENOUS

## 2013-07-23 MED ORDER — ONDANSETRON HCL 4 MG/2ML IJ SOLN
INTRAMUSCULAR | Status: DC | PRN
  Administered 2013-07-23: 4 mg via INTRAVENOUS

## 2013-07-23 MED ORDER — SUCCINYLCHOLINE CHLORIDE 20 MG/ML IJ SOLN
INTRAMUSCULAR | Status: DC | PRN
  Administered 2013-07-23: 100 mg via INTRAVENOUS

## 2013-07-23 MED ORDER — OXYCODONE HCL 5 MG/5ML PO SOLN: 5.0000 mg | Freq: Once | ORAL | Status: DC | PRN

## 2013-07-23 MED ORDER — CHLORHEXIDINE GLUCONATE 4 % EX LIQD: 60.0000 mL | Freq: Once | CUTANEOUS | Status: DC

## 2013-07-23 MED ORDER — OXYCODONE HCL 5 MG PO TABS: 5.0000 mg | ORAL_TABLET | Freq: Once | ORAL | Status: DC | PRN

## 2013-07-23 MED ORDER — LIDOCAINE HCL (CARDIAC) 20 MG/ML IV SOLN
INTRAVENOUS | Status: DC | PRN
  Administered 2013-07-23: 80 mg via INTRAVENOUS

## 2013-07-23 MED ORDER — OXYCODONE-ACETAMINOPHEN 5-325 MG PO TABS: 1.0000 | ORAL_TABLET | ORAL | Status: DC | PRN

## 2013-07-23 MED ORDER — MIDAZOLAM HCL 2 MG/2ML IJ SOLN
1.0000 mg | INTRAMUSCULAR | Status: DC | PRN
  Administered 2013-07-23: 2 mg via INTRAVENOUS

## 2013-07-23 MED ORDER — FENTANYL CITRATE 0.05 MG/ML IJ SOLN
50.0000 ug | INTRAMUSCULAR | Status: DC | PRN
  Administered 2013-07-23: 100 ug via INTRAVENOUS

## 2013-07-23 SURGICAL SUPPLY — 71 items
BANDAGE ELASTIC 4 VELCRO ST LF (GAUZE/BANDAGES/DRESSINGS) ×3 IMPLANT
BANDAGE ELASTIC 6 VELCRO ST LF (GAUZE/BANDAGES/DRESSINGS) ×3 IMPLANT
BANDAGE ESMARK 6X9 LF (GAUZE/BANDAGES/DRESSINGS) ×1 IMPLANT
BLADE AVERAGE 25MMX9MM (BLADE) ×1
BLADE AVERAGE 25X9 (BLADE) ×2 IMPLANT
BLADE SURG 15 STRL LF DISP TIS (BLADE) ×2 IMPLANT
BLADE SURG 15 STRL SS (BLADE) ×4
BNDG COHESIVE 4X5 TAN STRL (GAUZE/BANDAGES/DRESSINGS) ×3 IMPLANT
BNDG ESMARK 6X9 LF (GAUZE/BANDAGES/DRESSINGS) ×3
CANISTER SUCT 1200ML W/VALVE (MISCELLANEOUS) IMPLANT
COVER TABLE BACK 60X90 (DRAPES) ×3 IMPLANT
CUFF TOURNIQUET SINGLE 34IN LL (TOURNIQUET CUFF) ×3 IMPLANT
DRAPE EXTREMITY T 121X128X90 (DRAPE) ×3 IMPLANT
DRAPE OEC MINIVIEW 54X84 (DRAPES) ×3 IMPLANT
DRAPE U 20/CS (DRAPES) ×3 IMPLANT
DRAPE U-SHAPE 47X51 STRL (DRAPES) ×3 IMPLANT
DRSG PAD ABDOMINAL 8X10 ST (GAUZE/BANDAGES/DRESSINGS) ×3 IMPLANT
DURAPREP 26ML APPLICATOR (WOUND CARE) ×3 IMPLANT
ELECT REM PT RETURN 9FT ADLT (ELECTROSURGICAL) ×3
ELECTRODE REM PT RTRN 9FT ADLT (ELECTROSURGICAL) ×1 IMPLANT
GAUZE SPONGE 4X4 12PLY STRL (GAUZE/BANDAGES/DRESSINGS) ×3 IMPLANT
GAUZE XEROFORM 1X8 LF (GAUZE/BANDAGES/DRESSINGS) ×3 IMPLANT
GLOVE BIO SURGEON STRL SZ 6.5 (GLOVE) ×2 IMPLANT
GLOVE BIO SURGEONS STRL SZ 6.5 (GLOVE) ×1
GLOVE BIOGEL PI IND STRL 7.0 (GLOVE) ×2 IMPLANT
GLOVE BIOGEL PI INDICATOR 7.0 (GLOVE) ×4
GLOVE ECLIPSE 6.5 STRL STRAW (GLOVE) ×3 IMPLANT
GLOVE ORTHO TXT STRL SZ7.5 (GLOVE) ×6 IMPLANT
GLOVE SURG SS PI 7.0 STRL IVOR (GLOVE) ×6 IMPLANT
GOWN STRL REUS W/ TWL LRG LVL3 (GOWN DISPOSABLE) ×3 IMPLANT
GOWN STRL REUS W/ TWL XL LVL3 (GOWN DISPOSABLE) ×1 IMPLANT
GOWN STRL REUS W/TWL LRG LVL3 (GOWN DISPOSABLE) ×9
GOWN STRL REUS W/TWL XL LVL3 (GOWN DISPOSABLE) ×6 IMPLANT
IMPL SYS BIOCOMP ACH SPEED (Anchor) ×1 IMPLANT
IMPLANT SYS BIOCOMP ACH SPEED (Anchor) ×3 IMPLANT
NEEDLE 1/2 CIR CATGUT .05X1.09 (NEEDLE) IMPLANT
NEEDLE HYPO 22GX1.5 SAFETY (NEEDLE) ×3 IMPLANT
PACK BASIN DAY SURGERY FS (CUSTOM PROCEDURE TRAY) ×3 IMPLANT
PAD CAST 4YDX4 CTTN HI CHSV (CAST SUPPLIES) ×3 IMPLANT
PADDING CAST ABS 4INX4YD NS (CAST SUPPLIES) ×4
PADDING CAST ABS COTTON 4X4 ST (CAST SUPPLIES) ×2 IMPLANT
PADDING CAST COTTON 4X4 STRL (CAST SUPPLIES) ×9
PADDING CAST COTTON 6X4 STRL (CAST SUPPLIES) ×3 IMPLANT
PENCIL BUTTON HOLSTER BLD 10FT (ELECTRODE) ×3 IMPLANT
SLEEVE SCD COMPRESS KNEE MED (MISCELLANEOUS) ×3 IMPLANT
SPLINT FAST PLASTER 5X30 (CAST SUPPLIES) ×40
SPLINT FIBERGLASS 4X30 (CAST SUPPLIES) IMPLANT
SPLINT PLASTER CAST FAST 5X30 (CAST SUPPLIES) ×20 IMPLANT
STAPLER VISISTAT 35W (STAPLE) IMPLANT
STOCKINETTE 6  STRL (DRAPES) ×2
STOCKINETTE 6 STRL (DRAPES) ×1 IMPLANT
SUCTION FRAZIER TIP 10 FR DISP (SUCTIONS) ×3 IMPLANT
SUT ETHILON 3 0 PS 1 (SUTURE) ×3 IMPLANT
SUT FIBERWIRE #2 38 T-5 BLUE (SUTURE)
SUT VIC AB 0 CT1 27 (SUTURE)
SUT VIC AB 0 CT1 27XBRD ANBCTR (SUTURE) IMPLANT
SUT VIC AB 1 CT1 27 (SUTURE) ×3
SUT VIC AB 1 CT1 27XBRD ANBCTR (SUTURE) ×1 IMPLANT
SUT VIC AB 2-0 SH 27 (SUTURE) ×3
SUT VIC AB 2-0 SH 27XBRD (SUTURE) ×1 IMPLANT
SUT VIC AB 3-0 SH 27 (SUTURE)
SUT VIC AB 3-0 SH 27X BRD (SUTURE) IMPLANT
SUTURE FIBERWR #2 38 T-5 BLUE (SUTURE) IMPLANT
SYR BULB 3OZ (MISCELLANEOUS) ×3 IMPLANT
SYR CONTROL 10ML LL (SYRINGE) ×3 IMPLANT
TOWEL OR 17X24 6PK STRL BLUE (TOWEL DISPOSABLE) ×3 IMPLANT
TOWEL OR NON WOVEN STRL DISP B (DISPOSABLE) ×3 IMPLANT
TUBE CONNECTING 20'X1/4 (TUBING) ×1
TUBE CONNECTING 20X1/4 (TUBING) ×2 IMPLANT
UNDERPAD 30X30 INCONTINENT (UNDERPADS AND DIAPERS) ×3 IMPLANT
YANKAUER SUCT BULB TIP NO VENT (SUCTIONS) ×3 IMPLANT

## 2013-07-23 NOTE — Discharge Instructions (Signed)
HOME CARE INSTRUCTIONS:  Non-weight bearing until seen back in our office.  Do not remove splint.  May take a shower but do not let splint get wet.  Elevate operative extremity at much as possible.  Follow up appointment in our office in one week.  SEEK IMMEDIATE MEDICAL CARE IF:   You develop increased redness, swelling, or pain around your incision sites.  There is pus or any unusual drainage coming from your incision sites.  You develop a fever.  You notice a bad smell coming from your incision sites.  Any of your incisions break open (edges do not stay together) after sutures or staples have been removed. MAKE SURE YOU:  Understand these instructions.  Will watch your condition.  Will get help right away if you are not doing well or get worse. Document Released: 08/18/2004 Document Revised: 10/24/2011 Document Reviewed: 06/24/2011 [facility] Patient Information 2014 [place].  Regional Anesthesia Blocks  1. Numbness or the inability to move the "blocked" extremity may last from 3-48 hours after placement. The length of time depends on the medication injected and your individual response to the medication. If the numbness is not going away after 48 hours, call your surgeon.  2. The extremity that is blocked will need to be protected until the numbness is gone and the  Strength has returned. Because you cannot feel it, you will need to take extra care to avoid injury. Because it may be weak, you may have difficulty moving it or using it. You may not know what position it is in without looking at it while the block is in effect.  3. For blocks in the legs and feet, returning to weight bearing and walking needs to be done carefully. You will need to wait until the numbness is entirely gone and the strength has returned. You should be able to move your leg and foot normally before you try and bear weight or walk. You will need someone to be with you when you first try to ensure you  do not fall and possibly risk injury.  4. Bruising and tenderness at the needle site are common side effects and will resolve in a few days.  5. Persistent numbness or new problems with movement should be communicated to the surgeon or the [facility] [phone] [place] [phone]).   Post Anesthesia Home Care Instructions  Activity: Get plenty of rest for the remainder of the day. A responsible adult should stay with you for 24 hours following the procedure.  For the next 24 hours, DO NOT: -Drive a car -Paediatric nurse -Drink alcoholic beverages -Take any medication unless instructed by your physician -Make any legal decisions or sign important papers.  Meals: Start with liquid foods such as gelatin or soup. Progress to regular foods as tolerated. Avoid greasy, spicy, heavy foods. If nausea and/or vomiting occur, drink only clear liquids until the nausea and/or vomiting subsides. Call your physician if vomiting continues.  Special Instructions/Symptoms: Your throat may feel dry or sore from the anesthesia or the breathing tube placed in your throat during surgery. If this causes discomfort, gargle with warm salt water. The discomfort should disappear within 24 hours.

## 2013-07-23 NOTE — Progress Notes (Signed)
Assisted Dr. Frederick with right, ultrasound guided, popliteal/saphenous block. Side rails up, monitors on throughout procedure. See vital signs in flow sheet. Tolerated Procedure well. 

## 2013-07-23 NOTE — Transfer of Care (Signed)
Immediate Anesthesia Transfer of Care Note  Patient: Patrick Luna  Procedure(s) Performed: Procedure(s): RIGHT FOOT: EXCISION PARTIAL BONE TALUS/CALCANEUS, REPAIR RUPTURE  ACHILLES TENDON PRIMARY OPEN/PERCUTANEOUS (Right)  Patient Location: PACU  Anesthesia Type:General  Level of Consciousness: awake and sedated  Airway & Oxygen Therapy: Patient Spontanous Breathing and Patient connected to face mask oxygen  Post-op Assessment: Report given to PACU RN and Post -op Vital signs reviewed and stable  Post vital signs: Reviewed and stable  Complications: No apparent anesthesia complications

## 2013-07-23 NOTE — Anesthesia Postprocedure Evaluation (Signed)
Anesthesia Post Note  Patient: Patrick Luna  Procedure(s) Performed: Procedure(s) (LRB): RIGHT FOOT: EXCISION PARTIAL BONE TALUS/CALCANEUS, REPAIR RUPTURE  ACHILLES TENDON PRIMARY OPEN/PERCUTANEOUS (Right)  Anesthesia type: General  Patient location: PACU  Post pain: Pain level controlled  Post assessment: Patient's Cardiovascular Status Stable  Last Vitals:  Filed Vitals:   07/23/13 0930  BP: 130/76  Pulse: 69  Temp:   Resp: 13    Post vital signs: Reviewed and stable  Level of consciousness: alert  Complications: No apparent anesthesia complications

## 2013-07-23 NOTE — Anesthesia Preprocedure Evaluation (Signed)
Anesthesia Evaluation  Patient identified by MRN, date of birth, ID band Patient awake    Reviewed: Allergy & Precautions, H&P , NPO status , Patient's Chart, lab work & pertinent test results, reviewed documented beta blocker date and time   Airway Mallampati: II TM Distance: >3 FB Neck ROM: full    Dental   Pulmonary neg pulmonary ROS, former smoker,  breath sounds clear to auscultation        Cardiovascular negative cardio ROS  Rhythm:regular     Neuro/Psych negative neurological ROS  negative psych ROS   GI/Hepatic Neg liver ROS, GERD-  Medicated and Controlled,  Endo/Other  negative endocrine ROS  Renal/GU negative Renal ROS  negative genitourinary   Musculoskeletal   Abdominal   Peds  Hematology negative hematology ROS (+)   Anesthesia Other Findings See surgeon's H&P   Reproductive/Obstetrics negative OB ROS                           Anesthesia Physical Anesthesia Plan  ASA: II  Anesthesia Plan: General   Post-op Pain Management:    Induction: Intravenous  Airway Management Planned: LMA and Oral ETT  Additional Equipment:   Intra-op Plan:   Post-operative Plan: Extubation in OR  Informed Consent: I have reviewed the patients History and Physical, chart, labs and discussed the procedure including the risks, benefits and alternatives for the proposed anesthesia with the patient or authorized representative who has indicated his/her understanding and acceptance.   Dental Advisory Given  Plan Discussed with: CRNA and Surgeon  Anesthesia Plan Comments:         Anesthesia Quick Evaluation

## 2013-07-23 NOTE — Anesthesia Procedure Notes (Addendum)
Anesthesia Regional Block:  Popliteal block  Pre-Anesthetic Checklist: ,, timeout performed, Correct Patient, Correct Site, Correct Laterality, Correct Procedure, Correct Position, site marked, Risks and benefits discussed,  Surgical consent,  Pre-op evaluation,  At surgeon's request and post-op pain management  Laterality: Right  Prep: chloraprep       Needles:   Needle Type: Other     Needle Length: 9cm 9 cm Needle Gauge: 21 and 21 G    Additional Needles:  Procedures: ultrasound guided (picture in chart) Popliteal block Narrative:  Start time: 07/23/2013 7:02 AM End time: 07/23/2013 7:12 AM Injection made incrementally with aspirations every 5 mL.  Performed by: Personally  Anesthesiologist: Nada Libman, MD  Additional Notes: Ultrasound guidance used to: id relevant anatomy, confirm needle position, local anesthetic spread, avoidance of vascular puncture. Picture saved. No complications. Block performed personally by Jessy Oto. Albertina Parr, MD  .     Procedure Name: Intubation Performed by: Terrance Mass Pre-anesthesia Checklist: Patient identified, Timeout performed, Emergency Drugs available, Suction available and Patient being monitored Patient Re-evaluated:Patient Re-evaluated prior to inductionOxygen Delivery Method: Circle system utilized Preoxygenation: Pre-oxygenation with 100% oxygen Intubation Type: IV induction Ventilation: Mask ventilation without difficulty Grade View: Grade I Tube type: Oral Tube size: 8.0 mm Number of attempts: 2 Airway Equipment and Method: Stylet and Video-laryngoscopy Secured at: 23 cm Tube secured with: Tape Dental Injury: Teeth and Oropharynx as per pre-operative assessment

## 2013-07-23 NOTE — Interval H&P Note (Signed)
History and Physical Interval Note:  07/23/2013 7:23 AM  Patrick Luna  has presented today for surgery, with the diagnosis of Right Foot: Exostosis of Unspecified Site, Ruptire Tendon - Achilles Tendon  The various methods of treatment have been discussed with the patient and family. After consideration of risks, benefits and other options for treatment, the patient has consented to  Procedure(s): RIGHT FOOT: EXCISION PARTIAL BONE TALUS/CALCANEUS, REPAIR RUPTURE  ACHILLES TENDON PRIMARY OPEN/PERCUTANEOUS (Right) as a surgical intervention .  The patient's history has been reviewed, patient examined, no change in status, stable for surgery.  I have reviewed the patient's chart and labs.  Questions were answered to the patient's satisfaction.     Patrick Luna

## 2013-07-24 NOTE — Op Note (Signed)
NAMEJAIREN, Patrick Luna             ACCOUNT NO.:  [id number]  MEDICAL RECORD NO.:  [id number]  LOCATION:                                 FACILITY:  PHYSICIAN:  Ninetta Lights, M.D. DATE OF BIRTH:  [birth year]  DATE OF PROCEDURE:  07/23/2013 DATE OF DISCHARGE:  07/23/2013                              OPERATIVE REPORT   PREOPERATIVE DIAGNOSES:  Right heel extensive tearing, Achilles tendon at the attachment site, with attachment spurs, loose bodies, pump bump, and pre-Achilles bursitis.  POSTOPERATIVE DIAGNOSES:  Right heel extensive tearing, Achilles tendon at the attachment site, with attachment spurs, loose bodies, pump bump, and pre-Achilles bursitis.  PROCEDURE:  Right heel exploration.  Takedown debridement of Achilles tendon followed by primary repair utilizing Arthrex SpeedBridge technique.  Removal of all attachment spurs, pump bump, and pre-Achilles bursa.  SURGEON:  Ninetta Lights, M.D.  ASSISTANT:  Doran Stabler, PA present throughout the entire case, necessary for timely completion of procedure.  ANESTHESIA:  General.  BLOOD LOSS:  Minimal.  SPECIMENS:  None.  CULTURES:  None.  COMPLICATIONS:  None.  DRESSINGS:  Soft compressive short leg splint.  TOURNIQUET TIME:  45 minutes.  DESCRIPTION OF PROCEDURE:  The patient was brought to the operating room, placed on the operating table in supine position.  After adequate anesthesia had been obtained, tourniquet applied on the thigh.  Turned to a prone position.  Appropriate padding and support.  Prepped and draped in usual sterile fashion.  Exsanguinated with elevation of Esmarch.  Tourniquet inflated to 350 mmHg.  Longitudinal incision adjacent to Achilles, going down the lateral border of the os calcis. Skin and subcutaneous tissue divided.  The pathology exposed.  Tearing of more than 50% of the fibers were in the attachment site.  The entire Achilles was taken down, debrided, retracted superiorly.  All  the attachment spurs, loose bodies, pump bump, pre-Achilles bursa excised. Adequacy of this confirmed fluoroscopically.  Wound irrigated.  I then put in 2 sets of drill holes for repair.  A SpeedBridge technique was then utilized with 2 proximal anchors and sutures brought through the tendon at appropriate position, crossover, then anchored distally with 2 other SwiveLock anchors.  At completion, nice and firm repair confirmed. I can get him plantigrade without too much tension.  Wound was irrigated.  Closed with Vicryl and then nylon.  Sterile compressive dressing applied. Short-leg splint applied.  Tourniquet deflated and removed.  Returned to supine position.  Anesthesia reversed.  Brought to the recovery room. Tolerated the surgery well.  No complications.     Ninetta Lights, M.D.     DFM/MEDQ  D:  07/23/2013  T:  07/23/2013  Job:  [id number]

## 2013-07-27 ENCOUNTER — Encounter (HOSPITAL_BASED_OUTPATIENT_CLINIC_OR_DEPARTMENT_OTHER): Payer: Self-pay | Admitting: Orthopedic Surgery

## 2013-09-17 ENCOUNTER — Ambulatory Visit (HOSPITAL_COMMUNITY)
Admission: RE | Admit: 2013-09-17 | Discharge: 2013-09-17 | Disposition: A | Discharge status: Home / Self Care | Payer: PRIVATE HEALTH INSURANCE | Source: Ambulatory Visit | Attending: Orthopedic Surgery | Admitting: Orthopedic Surgery

## 2013-09-17 DIAGNOSIS — M25579 Pain in unspecified ankle and joints of unspecified foot: Secondary | ICD-10-CM | POA: Diagnosis not present

## 2013-09-17 DIAGNOSIS — R262 Difficulty in walking, not elsewhere classified: Secondary | ICD-10-CM | POA: Diagnosis not present

## 2013-09-17 DIAGNOSIS — IMO0001 Reserved for inherently not codable concepts without codable children: Secondary | ICD-10-CM | POA: Insufficient documentation

## 2013-09-17 NOTE — Evaluation (Signed)
Physical Therapy Evaluation  Patient Details  Name: Patrick Luna MRN: [id number] Date of Birth: 20-Oct-1945  Today's Date: 09/17/2013 Time: 0800-0845 PT Time Calculation (min): 45 min     Charges: 1 Eval, therEx [id number]         Visit#: 1 of 24  Re-eval: 10/17/13 Assessment Diagnosis: Rt achilles tendon repair,  Surgical Date: 07/23/13 Next MD Visit: Kathryne Hitch, 1st week of september Prior Therapy: no  Authorization: [employer]    Authorization Time Period:    Authorization Visit#:   of     Past Medical History:  Past Medical History  Diagnosis Date  . Hypercholesterolemia   . Chronic sinusitis   . Hepatic steatosis   . GERD (gastroesophageal reflux disease)    Past Surgical History:  Past Surgical History  Procedure Laterality Date  . Colonoscopy  2007  . Cystectomy      BENIGN CYST REMOVAL FROM RIGHT SHOULDER  . Esophageal manometry N/A 02/02/2013    Procedure: ESOPHAGEAL MANOMETRY (EM);  Surgeon: Cleotis Nipper, MD;  Location: [facility];  Service: Endoscopy;  Laterality: N/A;  . Hand debridement  2009    left  . Excision haglund's deformity with achilles tendon repair Right 07/23/2013    Procedure: RIGHT FOOT: EXCISION PARTIAL BONE TALUS/CALCANEUS, REPAIR RUPTURE  ACHILLES TENDON PRIMARY OPEN/PERCUTANEOUS;  Surgeon: Ninetta Lights, MD;  Location: [facility];  Service: Orthopedics;  Laterality: Right;    Subjective Symptoms/Limitations Symptoms: [name] with any weight bearing on Rt LE Pertinent History: Over a year ago patient began having pain and difficulty walking. X-ray showed spur and a detaching achilles tendon. Spur removed and achilles tendon reattached June 11, MD Patrick Luna. [name]: have to be "fleet footed and i have to be able to run after bad guys." How long can you stand comfortably?: dififuclty with prolonged standing How long can you walk comfortably?: <10 minutes Patient Stated Goals: TO be able to stand and place weight on it  without difficulty and to be bale to run without pain.  Pain Assessment Currently in Pain?: Yes (at rest) Pain Score: 1  (9/10 with full weight bearing) Pain Location: Heel Pain Orientation: Right Pain Type: Surgical pain Pain Onset: More than a month ago Pain Frequency: Intermittent Pain Relieving Factors: sleep, oxycodon takes off the edge a little, Effect of Pain on Daily Activities: dificulty standing, walking, inability to run.  Cognition/Observation Observation/Other Assessments Observations: Gait: excessive toe out, limited hip extension, forward trunk flexion, appropriate arch collapse, limited weight bearing on Rt LE.   Sensation/Coordination/Flexibility/Functional Tests Flexibility Thomas: Positive 90/90: Positive Functional Tests Functional Tests: Ely test positive Functional Tests: pain with Rt LE weight bearing  Assessment RLE AROM (degrees) Right Ankle Dorsiflexion: 22 Right Ankle Plantar Flexion: 16 RLE Strength Right Hip Flexion: 5/5 Right Hip Extension: 4/5 Right Hip External Rotation : 50 Right Hip Internal Rotation : 11 Right Hip ABduction: 4/5 Right Knee Flexion: 5/5 Right Knee Extension: 5/5 Right Ankle Dorsiflexion: 5/5 Right Ankle Plantar Flexion: 2/5 Right Ankle Inversion: 4/5 Right Ankle Eversion: 4/5 LLE AROM (degrees) Left Hip External Rotation : 40 Left Hip Internal Rotation : 15 Left Ankle Dorsiflexion: 8 Left Ankle Plantar Flexion: 40 LLE Strength Left Hip Flexion: 5/5 Left Hip Extension: 4/5 Left Hip ABduction: 4/5 Left Knee Flexion: 5/5 Left Knee Extension: 5/5 Left Ankle Dorsiflexion: 5/5 Left Ankle Plantar Flexion: 4/5 Left Ankle Inversion: 4/5 Left Ankle Eversion: 4/5  Exercise/Treatments 3way calf stretch 5x 10seconds Seated ankle dorsiflexion strength 4x10 seconds  Physical Therapy Assessment and Plan PT Assessment and Plan Clinical Impression Statement: Patient dispalys difficulty walking secondary to pain with Rt LE  weighbearing and Rt ankle weakness follwoign Rt achilles repain on 07/23/13 Patient has had no therapy since surgery. 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  Pt will benefit from skilled therapeutic intervention in order to improve on the following deficits: Abnormal gait;Decreased activity tolerance;Decreased balance;Difficulty walking;Decreased strength;Decreased range of motion;Decreased mobility;Increased edema;Increased fascial restricitons;Increased muscle spasms;Impaired flexibility;Pain;Improper body mechanics Rehab Potential: Good Clinical Impairments Affecting Rehab Potential: no prior history of injury and highly motivated PT Frequency: Min 2X/week PT Duration: 12 weeks PT Treatment/Interventions: Gait training;Stair training;Functional mobility training;Therapeutic activities;Therapeutic exercise;Balance training;Neuromuscular re-education;Modalities;Manual techniques;Patient/family education PT Plan: Initial focus on normalixzing bilateral ankle mobility and increasing Rt LE weightbearing. 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 See MD protocol for more detail    Goals Home Exercise Program Pt/caregiver will Perform Home Exercise Program: For increased ROM PT Goal: Perform Home Exercise Program - Progress: Goal set today PT Short Term Goals Time to Complete Short Term Goals: 4 weeks PT Short Term Goal 1: Patient will be able to plantar flex Rt foot 30 degrees to normalize stride length PT Short Term Goal 2: Patient will be able to demonstrate 3/5 MMT gastroc strength.  PT Short Term Goal 3: Patient will be able to  dorsiflex great toe to >45 degrees to decrease early toe off during gait.  PT Short Term Goal 4: Patient will be able to stand with 50% of his body weight on his Rt foot to beable to tstand > 10 minutes without pain >3/10 PT Short Term Goal 5: Patient will be able to internally rotate bilateral hips >35 degrees to increase shock absorbtion during gait.  PT Long Term Goals Time to Complete Long Term Goals: 12 weeks PT Long Term Goal 1: Patient will be ble to plantar flex Rt foot 45 degrees to stand on toes with full weight bearign to stna on toes. PT Long Term Goal 2: Patient will be able to stand with 100% weight bearign on Rt foot with Pain <2/10. Long Term Goal 3: Patient will be able to demsontrate 4/5 MMT for gastroc strength to be able to increase push off durign gait. 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 PT Long Term Goal 5: Patient will demosntrate 5/5 MMT  for glut max/med indicatign improved hip stability needed for running and cutting.   Problem List Patient Active Problem List   Diagnosis Date Noted  . GERD (gastroesophageal reflux disease) 12/31/2012  . Atypical chest pain 08/28/2010  . Hypercholesteremia 08/28/2010    PT - End of Session Activity Tolerance: Patient tolerated treatment well General Behavior During Therapy: WFL for tasks assessed/performed PT Plan of Care PT Home Exercise Plan: 3way calf stretch 5x 10 seconds, and seated plantar flexion stretch 5x10seconds Consulted and Agree with Plan of Care: Patient  GP    Leia Alf 09/17/2013, 12:24 PM  Physician Documentation Your signature is required to indicate approval of the treatment plan as  stated above.  Please sign and either send electronically or make a copy of this report for your files and return this physician signed original.   Please mark one 1.__approve of plan   2. ___approve of plan with the following conditions.   ______________________________                                                          _____________________ Physician Signature                                                                                                             Date

## 2013-09-29 ENCOUNTER — Ambulatory Visit (HOSPITAL_COMMUNITY)
Admission: RE | Admit: 2013-09-29 | Discharge: 2013-09-29 | Disposition: A | Discharge status: Home / Self Care | Payer: PRIVATE HEALTH INSURANCE | Source: Ambulatory Visit | Attending: Orthopedic Surgery | Admitting: Orthopedic Surgery

## 2013-09-29 DIAGNOSIS — IMO0001 Reserved for inherently not codable concepts without codable children: Secondary | ICD-10-CM | POA: Diagnosis not present

## 2013-09-29 NOTE — Progress Notes (Signed)
Physical Therapy Treatment Patient Details  Name: Patrick Luna MRN: [id number] Date of Birth: 05/14/1945  Today's Date: 09/29/2013 Time: 5427-0623 PT Time Calculation (min): 8 min Charge: TE [id number]   Visit#: 2 of 24  Re-eval: 10/17/13 Assessment Diagnosis: Rt achilles tendon repair,  Surgical Date: 07/23/13 Next MD Visit: Kathryne Hitch, 1st week of september Prior Therapy: no  Authorization: [employer]  Authorization Time Period:    Authorization Visit#:   of     Subjective: Symptoms/Limitations Symptoms: Pt reports compliance with HEP, currently pain free.  Pt stated ankle feels very weak.   Pain Assessment Currently in Pain?: No/denies  Objective:   Exercise/Treatments Ankle Stretches Soleus Stretch: 3 reps;30 seconds Gastroc Stretch: 3 reps;30 seconds Other Stretch: Seated dorsiflexion stretch 4x 20" Ankle Exercises - Standing Heel Raises: 10 reps Toe Raise: 10 reps Other Standing Ankle Exercises: 3D ankle excursion  Physical Therapy Assessment and Plan PT Assessment and Plan Clinical Impression Statement: Began PT POC to improve ankle mobility with stretches and 3D ankle excursion exercise.  Pt able to demonstrate appropriate form and technique with all exercises.  No reports of pain through session, pt reports he feels weak.  Pt adviced to apply ice with elevation for edema and pain control.   PT Plan: Initial focus on normalixzing bilateral ankle mobility and increasing Rt LE weightbearing. 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  PT Short Term Goal 2 - Progress: Progressing toward goal PT Short Term Goal 3: Patient will be able to dorsiflex great toe to >45 degrees to decrease early toe off during gait.  PT Short Term Goal 3 - Progress: Progressing toward goal PT Short Term Goal 4: Patient will be able to stand with 50% of his body weight on his Rt foot to beable to tstand > 10 minutes without pain >3/10 PT Short Term Goal 4 - Progress: Progressing toward goal PT Short Term Goal 5: Patient will be able to internally rotate bilateral hips >35 degrees to increase shock absorbtion during gait.  PT Long Term Goals PT Long Term Goal 1: Patient will be ble to plantar flex Rt foot 45 degrees to stand on toes with full weight bearign to stna on toes. PT Long Term Goal 2: Patient will be able to stand with 100% weight bearign on Rt foot with Pain <2/10. Long Term Goal 3: Patient will be able to demsontrate 4/5 MMT for gastroc strength to be able to increase push off durign gait. 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 PT Long Term Goal 5: Patient will demosntrate 5/5 MMT  for glut max/med indicatign improved hip stability needed for running and cutting.   Problem List Patient Active Problem List   Diagnosis Date Noted  . GERD (gastroesophageal reflux disease) 12/31/2012  . Atypical chest pain 08/28/2010  . Hypercholesteremia 08/28/2010    PT -  End of Session Activity Tolerance: Patient tolerated treatment well General Behavior During Therapy: [facility] for tasks assessed/performed  GP    Aldona Lento 09/29/2013, 9:41 AM

## 2013-10-06 ENCOUNTER — Ambulatory Visit (HOSPITAL_COMMUNITY)
Admission: RE | Admit: 2013-10-06 | Discharge: 2013-10-06 | Disposition: A | Discharge status: Home / Self Care | Payer: PRIVATE HEALTH INSURANCE | Source: Ambulatory Visit | Attending: Orthopedic Surgery | Admitting: Orthopedic Surgery

## 2013-10-06 DIAGNOSIS — IMO0001 Reserved for inherently not codable concepts without codable children: Secondary | ICD-10-CM | POA: Diagnosis not present

## 2013-10-06 NOTE — Progress Notes (Signed)
Physical Therapy Treatment Patient Details  Name: Patrick Luna MRN: [id number] Date of Birth: [birth year]  Today's Date: 10/06/2013 Time: 0800-0842 PT Time Calculation (min): 42 min Charge: TE [id number]  Visit#: 3 of 24  Re-eval: 10/17/13 Assessment Diagnosis: Rt achilles tendon repair,  Surgical Date: 07/23/13 Next MD Visit: Kathryne Hitch, 1st week of september Prior Therapy: no  Authorization: [employer]  Authorization Time Period:    Authorization Visit#:   of     Subjective: Symptoms/Limitations Symptoms: Ankle is feeling better, pain minimum today.   Pain Assessment Currently in Pain?: Yes Pain Score: 1  Pain Location: Ankle Pain Orientation: Right  Objective:   Exercise/Treatments Ankle Stretches Soleus Stretch: 1 rep;30 seconds;2 reps;Limitations Soleus Stretch Limitations: 1 rep slant board; 2 reps standard stretch Slant Board Stretch: 3 reps;30 seconds Ankle Exercises - Standing Rocker Board: 2 minutes;Limitations Rocker Board Limitations: R/L and A/P Heel Raises: 10 reps Toe Raise: 10 reps Other Standing Ankle Exercises: 3D ankle excursion Other Standing Ankle Exercises: 3D hip excursion 10x Ankle Exercises - Supine T-Band: GTB 15x each direction; HEP given  Physical Therapy Assessment and Plan PT Assessment and Plan Clinical Impression Statement: Progresseed LE mobility with 3D hip excursion and cotninued stretches and ankle excursion to improve ankle AROM.  Added rocker board to improve weight distribution with gait.  Began theraband for resistance strengthening, pt given worksheet and theraband for HEP.   PT Plan: Initial focus on normalixzing bilateral ankle mobility and increasing Rt LE weightbearing. 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 See MD protocol for more detail.  Begin lunges on 6 in step next session and progress to SLS  activities.      Goals PT Short Term Goals PT Short Term Goal 1: Patient will be able to plantar flex Rt foot 30 degrees to normalize stride length PT Short Term Goal 1 - Progress: Progressing toward goal PT Short Term Goal 2: Patient will be able to demonstrate 3/5 MMT gastroc strength.  PT Short Term Goal 2 - Progress: Progressing toward goal PT Short Term Goal 3: Patient will be able to dorsiflex great toe to >45 degrees to decrease early toe off during gait.  PT Short Term Goal 3 - Progress: Progressing toward goal PT Short Term Goal 4: Patient will be able to stand with 50% of his body weight on his Rt foot to beable to tstand > 10 minutes without pain >3/10 PT Short Term Goal 4 - Progress: Progressing toward goal PT Short Term Goal 5: Patient will be able to internally rotate bilateral hips >35 degrees to increase shock absorbtion during gait.  PT Long Term Goals PT Long Term Goal 1: Patient will be ble to plantar flex Rt foot 45 degrees to stand on toes with full weight bearign to stna on toes. PT Long Term Goal 2: Patient will be able to stand with 100% weight bearign on Rt foot with Pain <2/10. Long Term Goal 3: Patient will be able to demsontrate 4/5 MMT for gastroc strength to be able to increase push off durign gait. 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 PT Long Term Goal 5: Patient will demosntrate 5/5 MMT  for glut max/med indicatign improved hip stability needed for running and cutting.  Long Term Goal 5 Progress: Progressing toward goal  Problem List Patient Active Problem List   Diagnosis Date Noted  . GERD (gastroesophageal reflux disease) 12/31/2012  . Atypical chest pain 08/28/2010  . Hypercholesteremia 08/28/2010    PT - End of Session Activity Tolerance: Patient tolerated treatment  well General Behavior During Therapy: [facility] for tasks assessed/performed  GP    Aldona Lento 10/06/2013, 9:58 AM

## 2013-10-08 ENCOUNTER — Ambulatory Visit (HOSPITAL_COMMUNITY)
Admission: RE | Admit: 2013-10-08 | Discharge: 2013-10-08 | Disposition: A | Discharge status: Home / Self Care | Payer: PRIVATE HEALTH INSURANCE | Source: Ambulatory Visit | Attending: Orthopedic Surgery | Admitting: Orthopedic Surgery

## 2013-10-08 DIAGNOSIS — IMO0001 Reserved for inherently not codable concepts without codable children: Secondary | ICD-10-CM | POA: Diagnosis not present

## 2013-10-08 NOTE — Progress Notes (Signed)
Physical Therapy Treatment Patient Details  Name: Patrick Luna MRN: [id number] Date of Birth: October 30, 1945  Today's Date: 10/08/2013 Time: 0800-0840 PT Time Calculation (min): 40 min Charge: TE [id number]   Visit#: 4 of 24  Re-eval: 10/17/13 Assessment Diagnosis: Rt achilles tendon repair,  Surgical Date: 07/23/13 Next MD Visit: Kathryne Hitch, 1st week of september Prior Therapy: no  Authorization: [employer]  Authorization Time Period:    Authorization Visit#:   of     Subjective: Symptoms/Limitations Symptoms: Ankle is pain free today, no question with new theraband exercises, has been completeing Pain Assessment Currently in Pain?: No/denies  Objective:   Exercise/Treatments Ankle Stretches Soleus Stretch: 3 reps;30 seconds;Limitations Soleus Stretch Limitations: slant board Slant Board Stretch: 3 reps;30 seconds Ankle Exercises - Standing SLS: Lt 47", Rt 60"; Vector stance 5x 5" with 1 HHA Rocker Board: 2 minutes;Limitations Rocker Board Limitations: R/L and A/P Heel Raises: 10 reps Toe Raise: 10 reps Other Standing Ankle Exercises: forward lunge on 6in step 10x for dorsiflexion; balance reach matrix common; squat step around 6in step for IR Other Standing Ankle Exercises: 3D hip and ankle excursion 10x  Physical Therapy Assessment and Plan PT Assessment and Plan Clinical Impression Statement: Progressed AROM and hip mobilty and stabilty with SLS activities.  Added forward lunges over 6in to improve dorsi flexion.  Began balance activtieis, pt able to SLS 60" first attempt.  Progressed to vector stance and balance reach matrix for hip and ankle stability.  Added hip IR with gluteal activation to reduce loading shock of ankle with gait.  No reports of increased pain through session. PT Plan: Initial focus on normalixzing bilateral ankle mobility and increasing Rt LE weightbearing. 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     Goals PT Short Term Goals PT Short Term Goal 1: Patient will be able to plantar flex Rt foot 30 degrees to normalize stride length PT Short Term Goal 1 - Progress: Progressing toward goal PT Short Term Goal 2: Patient will be able to demonstrate 3/5 MMT gastroc strength.  PT Short Term Goal 2 - Progress: Progressing toward goal PT Short Term Goal 3: Patient will be able to dorsiflex great toe to >45 degrees to decrease early toe off during gait.  PT Short Term Goal 3 - Progress: Progressing toward goal PT Short Term Goal 4: Patient will be able to stand with 50% of his body weight on his Rt foot to beable to tstand > 10 minutes without pain >3/10 PT Short Term Goal 4 - Progress: Progressing toward goal PT Short Term Goal 5: Patient will be able to internally rotate bilateral hips >35 degrees to increase shock absorbtion during gait.  PT Long Term Goals PT Long Term Goal 1: Patient will be ble to plantar flex Rt foot 45 degrees to stand on toes with full weight bearign to stna on toes. PT Long Term Goal 2: Patient will be able to stand with 100% weight bearign on Rt foot with Pain <2/10. Long Term Goal 3: Patient will be able to demsontrate 4/5 MMT for gastroc strength to be able to increase push off durign gait. 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 PT Long Term Goal 5: Patient will demosntrate 5/5 MMT  for glut  max/med indicatign improved hip stability needed for running and cutting.   Problem List Patient Active Problem List   Diagnosis Date Noted  . GERD (gastroesophageal reflux disease) 12/31/2012  . Atypical chest pain 08/28/2010  . Hypercholesteremia 08/28/2010    PT - End of Session Activity Tolerance: Patient tolerated treatment  well General Behavior During Therapy: [facility] for tasks assessed/performed  GP    Aldona Lento 10/08/2013, 8:46 AM

## 2013-10-12 ENCOUNTER — Ambulatory Visit (HOSPITAL_COMMUNITY)
Admission: RE | Admit: 2013-10-12 | Discharge: 2013-10-12 | Disposition: A | Discharge status: Home / Self Care | Payer: PRIVATE HEALTH INSURANCE | Source: Ambulatory Visit | Attending: Orthopedic Surgery | Admitting: Orthopedic Surgery

## 2013-10-12 DIAGNOSIS — IMO0001 Reserved for inherently not codable concepts without codable children: Secondary | ICD-10-CM | POA: Diagnosis not present

## 2013-10-12 NOTE — Progress Notes (Signed)
Physical Therapy Treatment/ Reassessment prior MD apt Patient Details  Name: Patrick Luna MRN: [id number] Date of Birth: 1945-12-03  Today's Date: 10/12/2013 Time: 4967-5916 PT Time Calculation (min): 48 min Charge: TE [id number], MMT/ROM Measurement [phone]  Visit#: 5 of 24  Re-eval: 11/09/13 Assessment Diagnosis: Rt achilles tendon repair,  Surgical Date: 07/23/13 Next MD Visit: Kathryne Hitch, 10/13/2013 Prior Therapy: no  Authorization: [employer]  Authorization Time Period:    Authorization Visit#:   of     Subjective: Symptoms/Limitations Symptoms: Pt on feet alot this weekend, did a lot of walking.  Pain scale 1-2/10 How long can you stand comfortably?: Able to stand for 10 minutes (had dififuclty with prolonged standing) How long can you walk comfortably?: 1-2 hours (was <10 minutes) Pain Assessment Currently in Pain?: Yes Pain Score: 2  Pain Location: Ankle Pain Orientation: Right  Objective:   10/12/13 0800  Assessment  Diagnosis Rt achilles tendon repair,   Surgical Date 07/23/13  Next MD Visit Kathryne Hitch, 10/13/2013  Prior Therapy no  Flexibility  Marcello Moores (was positive)  90/90 Positive (was positive)  Functional Tests  Functional Tests Ely test positive (Ely test positive)  Functional Tests pain heel with Rt LE weight bearing  RLE AROM (degrees)  Right Ankle Dorsiflexion 30 (was 22)  Right Ankle Plantar Flexion 38 (was 16)  Right Ankle Inversion 22  Right Ankle Eversion 15  RLE Strength  Right Hip Flexion 5/5  Right Hip Extension (4+/5 was 4/5)  Right Hip External Rotation  50 (was 50)  Right Hip Internal Rotation  25 (was 11)  Right Hip ABduction (4+/5 was 4/5)  Right Knee Flexion 5/5  Right Knee Extension 5/5  Right Ankle Dorsiflexion 5/5  Right Ankle Plantar Flexion 2+/5 (was 2/5)  Right Ankle Inversion (4+/5 was 4/5)  Right Ankle Eversion 5/5 (was 4/5)  LLE AROM (degrees)  Left Hip External Rotation  60 (was 40)  Left Hip  Internal Rotation  20 (was 15)  Left Ankle Dorsiflexion 15 (was 8)  Left Ankle Plantar Flexion 55 (was 40)  LLE Strength  Left Hip Flexion 5/5  Left Hip Extension 5/5 (was 4/5)  Left Hip ABduction (was 4/5)  Left Knee Flexion 5/5  Left Knee Extension 5/5  Left Ankle Dorsiflexion 5/5  Left Ankle Plantar Flexion (was 4/5)  Left Ankle Inversion (was 4/5)  Left Ankle Eversion (was 4/5)    Exercise/Treatments Ankle Stretches Soleus Stretch: 3 reps;30 seconds;Limitations Soleus Stretch Limitations: slant board Slant Board Stretch: 3 reps;30 seconds Ankle Exercises - Standing Heel Raises: 20 reps Toe Raise: 20 reps Other Standing Ankle Exercises: balance reach matrix common; squat step around 6in step for IR Other Standing Ankle Exercises: 3D hip and ankle excursion 10x  Physical Therapy Assessment and Plan PT Assessment and Plan Clinical Impression Statement: Reviewed goals prior MD apt tomorrow with positive findings.  Pt improving AROM and progressing towards LE strengthening.  Pt reports increased tolerance with walking but prolonged standing is difficult.  Session focus on improving AROM with ankle and hip excursion and gastroc strengthening. PT Plan: Initial focus on normalixzing bilateral ankle mobility and increasing Rt LE weightbearing. 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 See MD protocol for more detail.     Goals PT Short Term Goals PT Short Term Goal 1: Patient will be able to plantar flex Rt foot 30 degrees to normalize stride length PT Short Term  Goal 1 - Progress: Met PT Short Term Goal 2: Patient will be able to demonstrate 3/5 MMT gastroc strength.  PT Short Term Goal 2 - Progress: Progressing toward goal PT Short Term Goal 3: Patient will be able to dorsiflex great toe to >45 degrees to decrease early toe off during gait.  PT Short Term Goal  3 - Progress: Progressing toward goal PT Short Term Goal 4: Patient will be able to stand with 50% of his body weight on his Rt foot to beable to tstand > 10 minutes without pain >3/10 PT Short Term Goal 4 - Progress: Met PT Short Term Goal 5: Patient will be able to internally rotate bilateral hips >35 degrees to increase shock absorbtion during gait.  PT Short Term Goal 5 - Progress: Progressing toward goal PT Long Term Goals PT Long Term Goal 1: Patient will be ble to plantar flex Rt foot 45 degrees to stand on toes with full weight bearign to stna on toes. PT Long Term Goal 1 - Progress: Not met PT Long Term Goal 2: Patient will be able to stand with 100% weight bearign on Rt foot with Pain <2/10. PT Long Term Goal 2 - Progress: Progressing toward goal Long Term Goal 3: Patient will be able to demsontrate 4/5 MMT for gastroc strength to be able to increase push off durign gait. 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 Long Term Goal 4 Progress: Not met PT Long Term Goal 5: Patient will demosntrate 5/5 MMT  for glut max/med indicatign improved hip stability needed for running and cutting.  Long Term Goal 5 Progress: Not met  Problem List Patient Active Problem List   Diagnosis Date Noted  . GERD (gastroesophageal reflux disease) 12/31/2012  . Atypical chest pain 08/28/2010  . Hypercholesteremia 08/28/2010    PT - End of Session Activity Tolerance: Patient tolerated treatment well General Behavior During Therapy: [facility] for tasks assessed/performed  GP    Aldona Lento 10/12/2013, 12:27 PM   Devona Konig PT DPT

## 2013-10-13 ENCOUNTER — Ambulatory Visit (HOSPITAL_COMMUNITY)
Admission: RE | Admit: 2013-10-13 | Discharge: 2013-10-13 | Disposition: A | Discharge status: Home / Self Care | Payer: PRIVATE HEALTH INSURANCE | Source: Ambulatory Visit | Attending: Family Medicine | Admitting: Family Medicine

## 2013-10-13 DIAGNOSIS — IMO0001 Reserved for inherently not codable concepts without codable children: Secondary | ICD-10-CM | POA: Insufficient documentation

## 2013-10-13 DIAGNOSIS — M25579 Pain in unspecified ankle and joints of unspecified foot: Secondary | ICD-10-CM | POA: Insufficient documentation

## 2013-10-13 DIAGNOSIS — R262 Difficulty in walking, not elsewhere classified: Secondary | ICD-10-CM | POA: Diagnosis not present

## 2013-10-13 NOTE — Progress Notes (Signed)
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  Pain Assessment Currently in Pain?: Yes Pain Score: 1  Pain Location: Ankle Pain Orientation: Right  Exercise/Treatments Ankle Stretches Soleus Stretch: Limitations Soleus Stretch Limitations: 3 way 20 seconds 4x Gastroc Stretch: Limitations Gastroc Stretch Limitations: 3 way 20 seconds 4x Ankle Exercises - Standing SLS: balance reach matrix common 5x; Rocker Board: 2 minutes;Limitations Rocker Board Limitations: R/L and A/P Heel Raises: 20 reps Toe Raise: 20 reps Other Standing Ankle Exercises:  squat around for IR 10x Other Standing Ankle Exercises: SXT squat 5x each   Manual therapy: subtalar distraction and talus anterior glides to increase plantar flexion  Physical Therapy Assessment and Plan PT Assessment and Plan Clinical Impression Statement: Patient demosntrates good progress and no increase in pain this session with iontroduction of multiplanar squatting. Patient demosntrated noted difficulties during single leg balance reach secondary to ankle weakness. Patient dmeosntrated good performance of all other exercises with only mild pain noted during soleus stretching.  PT Plan: Focus to shift to improvign hip mobility with more focus now on increasing ankle/knee/hip strength and stability. Introduce multiplanar calf raises next session.     Problem List Patient Active Problem List   Diagnosis Date Noted  . GERD (gastroesophageal reflux disease)  12/31/2012  . Atypical chest pain 08/28/2010  . Hypercholesteremia 08/28/2010    PT - End of Session Activity Tolerance: Patient tolerated treatment well General Behavior During Therapy: [facility] for tasks assessed/performed  GP    Patrick Luna R 10/13/2013, 8:39 AM

## 2013-10-15 ENCOUNTER — Ambulatory Visit (HOSPITAL_COMMUNITY)
Admission: RE | Admit: 2013-10-15 | Discharge: 2013-10-15 | Disposition: A | Discharge status: Home / Self Care | Payer: PRIVATE HEALTH INSURANCE | Source: Ambulatory Visit | Attending: Orthopedic Surgery | Admitting: Orthopedic Surgery

## 2013-10-15 DIAGNOSIS — IMO0001 Reserved for inherently not codable concepts without codable children: Secondary | ICD-10-CM | POA: Diagnosis not present

## 2013-10-15 NOTE — Progress Notes (Signed)
Physical Therapy Treatment Patient Details  Name: Patrick Luna MRN: [id number] Date of Birth: [birth year]  Today's Date: 10/15/2013 Time: 0800-0844 PT Time Calculation (min): 47 min Charge: TE [id number]  Visit#: 7 of 24  Re-eval: 11/09/13 Assessment Diagnosis: Rt achilles tendon repair,  Surgical Date: 07/23/13 Next MD Visit: Kathryne Hitch, 6 weeks Prior Therapy: no  Authorization: [employer]  Authorization Time Period:    Authorization Visit#:   of     Subjective: Symptoms/Limitations Symptoms: Ankle is feeling good, MD happy with progress wants pt to improve gastroc strength Pain Assessment Currently in Pain?: Yes Pain Score: 1  Pain Location: Ankle Pain Orientation: Right  Objective:  Exercise/Treatments Ankle Stretches Soleus Stretch: Limitations Soleus Stretch Limitations: 3 way 20 seconds 4x Gastroc Stretch: Limitations Gastroc Stretch Limitations: 3 way 20 seconds 4x Machines for Strengthening Cybex Leg Press: Rt LE only 1 Pl plantar flexion wiht eccentric dorsiflexion 2x 10reps Ankle Exercises - Standing SLS: balance reach matrix common 5x; Heel Raises: Limitations Heel Raises Limitations: 3 sets x 10 reps in neutral, IR and ER eccentric control Toe Raise: Limitations Toe Raise Limitations: 3 sets x 10 reps in neutral, IR and ER Other Standing Ankle Exercises:  squat around for IR 10x  Physical Therapy Assessment and Plan PT Assessment and Plan Clinical Impression Statement: Progressed gastrocnemius strengthening with multiplanes heel raises and began leg press for plantar and dorsiflexion eccentric control.  Continued with hip mobiility exercises to improve IR to reduce loading stress on ankle with gait.  Pt ankle limited by fatigue, no reports of increased pain through session.   PT Plan: Continue with current PT POC to improve ankle/knee/hip strength and stability.  Continue multiplanar calf raises.    Goals PT Short Term Goals PT Short Term Goal 1:  Patient will be able to plantar flex Rt foot 30 degrees to normalize stride length PT Short Term Goal 2: Patient will be able to demonstrate 3/5 MMT gastroc strength.  PT Short Term Goal 2 - Progress: Progressing toward goal PT Short Term Goal 3: Patient will be able to dorsiflex great toe to >45 degrees to decrease early toe off during gait.  PT Short Term Goal 3 - Progress: Progressing toward goal PT Short Term Goal 4: Patient will be able to stand with 50% of his body weight on his Rt foot to beable to tstand > 10 minutes without pain >3/10 PT Short Term Goal 5: Patient will be able to internally rotate bilateral hips >35 degrees to increase shock absorbtion during gait.  PT Short Term Goal 5 - Progress: Progressing toward goal PT Long Term Goals PT Long Term Goal 1: Patient will be ble to plantar flex Rt foot 45 degrees to stand on toes with full weight bearign to stna on toes. PT Long Term Goal 2: Patient will be able to stand with 100% weight bearign on Rt foot with Pain <2/10. Long Term Goal 3: Patient will be able to demsontrate 4/5 MMT for gastroc strength to be able to increase push off durign gait. 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 PT Long Term Goal 5: Patient will demosntrate 5/5 MMT  for glut max/med indicatign improved hip stability needed for running and cutting.   Problem List Patient Active Problem List   Diagnosis Date Noted  . GERD (gastroesophageal reflux disease) 12/31/2012  . Atypical chest pain 08/28/2010  . Hypercholesteremia  08/28/2010    PT - End of Session Activity Tolerance: Patient tolerated treatment well General Behavior During Therapy: [facility] for tasks assessed/performed  GP    Aldona Lento 10/15/2013, 8:47 AM

## 2013-10-27 ENCOUNTER — Ambulatory Visit (HOSPITAL_COMMUNITY)
Admission: RE | Admit: 2013-10-27 | Discharge: 2013-10-27 | Disposition: A | Discharge status: Home / Self Care | Payer: PRIVATE HEALTH INSURANCE | Source: Ambulatory Visit | Attending: Family Medicine | Admitting: Family Medicine

## 2013-10-27 DIAGNOSIS — IMO0001 Reserved for inherently not codable concepts without codable children: Secondary | ICD-10-CM | POA: Diagnosis not present

## 2013-10-27 NOTE — Progress Notes (Signed)
Physical Therapy Treatment Patient Details  Name: KELAND PEYTON MRN: [id number] Date of Birth: 03/23/1945  Today's Date: 10/27/2013 Time: 0800-0841 PT Time Calculation (min): 73 min TE [id number]  Visit#: 8 of 24  Re-eval: 11/09/13 Assessment Diagnosis: Rt achilles tendon repair,  Surgical Date: 07/23/13 Next MD Visit: Kathryne Hitch, 6 weeks   Subjective: Pain Assessment Pain Score: 1  Pain Location: Ankle Pain Orientation: Right   Exercise/Treatments Ankle Stretches Plantar Fascia Stretch: 3 reps;20 seconds Soleus Stretch: Limitations Soleus Stretch Limitations: 3 way 20 seconds 4x Gastroc Stretch: Limitations Gastroc Stretch Limitations: 3 way 20 seconds 4x Ankle Exercises - Standing Vector Stance: Right;Left;Limitations Vector Stance Limitations: Tandem Balance, 1' with moving ball toss SLS: balance reach matrix common 5x; Heel Raises: Limitations Heel Raises Limitations: 3 sets x 10 reps in neutral, IR and ER eccentric control Toe Raise: Limitations Toe Raise Limitations: 3 sets x 10 reps in neutral, IR and ER Other Standing Ankle Exercises:  squat around for IR 10x Other Standing Ankle Exercises: Single Leg Squat on TG  (34 degrees) 2x10  Physical Therapy Assessment and Plan PT Assessment and Plan Clinical Impression Statement: Pt reports some difficulty with HR with toes ER and TR with toes IR, though no increases in pain or discomfort reported.   Progressed balance exercises to tandem balance with reactive ball toss, with difficulties on the Rt side to maintain balance especially during tossess to Rt or Lt side.  Encouragement required during balance reaching exercise to avoid HHA on parallel bars and attempting to correct balance without HHA.  Pt will benefit from skilled therapeutic intervention in order to improve on the following deficits: Abnormal gait;Decreased activity tolerance;Decreased balance;Difficulty walking;Decreased strength;Decreased range of  motion;Decreased mobility;Increased edema;Increased fascial restricitons;Increased muscle spasms;Impaired flexibility;Pain;Improper body mechanics PT Plan: Continue with current PT POC to improve ankle/knee/hip strength and stability.  Continue multiplanar calf raises.    Goals PT Short Term Goals PT Short Term Goal 2: Patient will be able to demonstrate 3/5 MMT gastroc strength.  PT Short Term Goal 2 - Progress: Progressing toward goal PT Short Term Goal 3: Patient will be able to dorsiflex great toe to >45 degrees to decrease early toe off during gait.  PT Short Term Goal 3 - Progress: Progressing toward goal PT Short Term Goal 5: Patient will be able to internally rotate bilateral hips >35 degrees to increase shock absorbtion during gait.  PT Short Term Goal 5 - Progress: Progressing toward goal  Problem List Patient Active Problem List   Diagnosis Date Noted  . GERD (gastroesophageal reflux disease) 12/31/2012  . Atypical chest pain 08/28/2010  . Hypercholesteremia 08/28/2010    PT - End of Session Activity Tolerance: Patient tolerated treatment well General Behavior During Therapy: [facility] for tasks assessed/performed   Naseem Adler 10/27/2013, 8:43 AM

## 2013-10-29 ENCOUNTER — Telehealth (HOSPITAL_COMMUNITY): Payer: Self-pay

## 2013-10-29 ENCOUNTER — Ambulatory Visit (HOSPITAL_COMMUNITY): Payer: Medicare HMO

## 2013-10-29 NOTE — Telephone Encounter (Signed)
Wife came by to cx - patient is sick

## 2013-11-03 ENCOUNTER — Ambulatory Visit (HOSPITAL_COMMUNITY)
Admission: RE | Admit: 2013-11-03 | Discharge: 2013-11-03 | Disposition: A | Discharge status: Home / Self Care | Payer: PRIVATE HEALTH INSURANCE | Source: Ambulatory Visit | Attending: Orthopedic Surgery | Admitting: Orthopedic Surgery

## 2013-11-03 DIAGNOSIS — IMO0001 Reserved for inherently not codable concepts without codable children: Secondary | ICD-10-CM | POA: Diagnosis not present

## 2013-11-03 NOTE — Progress Notes (Signed)
Physical Therapy Treatment Patient Details  Name: Patrick Luna MRN: [id number] Date of Birth: 1945/12/08  Today's Date: 11/03/2013 Time: 3785-8850 PT Time Calculation (min): 43 min Charge:  There ex [id number] Visit#: 9 of 24  Re-eval: 11/09/13   Subjective: Symptoms/Limitations Symptoms: Pt states no pain it just feels as if his ankle has no control Pain Assessment Currently in Pain?: No/denies    Exercise/Treatments     Ankle Stretches Plantar Fascia Stretch: 3 reps;20 seconds Soleus Stretch: Limitations Gastroc Stretch: Limitations Gastroc Stretch Limitations: 3 way 20 seconds 4x   Ankle Exercises - Standing BAPS: Level 3;Standing;10 reps Vector Stance: Left;10 seconds Rocker Board: 2 minutes Rocker Board Limitations: R/L and A/P Heel Raises: 15 reps Toe Raise: 10 reps Other Standing Ankle Exercises: steps 4' 2 RT exaggerating heel lift/knee bend  Ankle Exercises - Seated Other Seated Ankle Exercises: cybes leg press using ankle 3 Pl x 15     Physical Therapy Assessment and Plan PT Assessment and Plan Clinical Impression Statement: Pt did well with new added exercises needing verbal cuing for proper technique.  Pt continues to have decreased ROM as well as strength and coordination  PT Plan: Continue with current PT POC to improve ankle/knee/hip strength and stability.      Goals  progressing   Problem List Patient Active Problem List   Diagnosis Date Noted  . GERD (gastroesophageal reflux disease) 12/31/2012  . Atypical chest pain 08/28/2010  . Hypercholesteremia 08/28/2010       GP    Phillp Dolores,CINDY 11/03/2013, 10:29 AM

## 2013-11-05 ENCOUNTER — Ambulatory Visit (HOSPITAL_COMMUNITY)
Admission: RE | Admit: 2013-11-05 | Discharge: 2013-11-05 | Disposition: A | Discharge status: Home / Self Care | Payer: PRIVATE HEALTH INSURANCE | Source: Ambulatory Visit | Attending: Orthopedic Surgery | Admitting: Orthopedic Surgery

## 2013-11-05 DIAGNOSIS — IMO0001 Reserved for inherently not codable concepts without codable children: Secondary | ICD-10-CM | POA: Diagnosis not present

## 2013-11-05 NOTE — Progress Notes (Signed)
Physical Therapy Treatment Patient Details  Name: Patrick Luna MRN: [id number] Date of Birth: March 03, 1945  Today's Date: 11/05/2013 Time: 0802-0845 PT Time Calculation (min): 43 min Charge:  There ex [phone]  Visit#: 10 of 24  Re-eval: 11/09/13    Authorization: [employer]  Subjective: Symptoms/Limitations Symptoms: Pt states that his calf was cramping for about 20 minutes after treatment last time. Pain Assessment Currently in Pain?: No/denies     Exercise/Treatments  Ankle Stretches Plantar Fascia Stretch: 3 reps;20 seconds Soleus Stretch: Limitations Gastroc Stretch: Limitations Gastroc Stretch Limitations: 3 way 20 seconds 4x   Ankle Exercises - Standing BAPS: Level 3;Standing;10 reps Vector Stance: Right;5 seconds Rocker Board: 2 minutes Rocker Board Limitations: R/L and A/P Heel Raises: 15 reps Toe Raise: 15 reps Other Standing Ankle Exercises: steps 4' 2 RT exaggerating heel lift/knee bend  Ankle Exercises - Seated Other Seated Ankle Exercises: cybes leg press using ankle 4 Pl x 15  Other Seated Ankle Exercises: inversion x 10    Physical Therapy Assessment and Plan PT Assessment and Plan Clinical Impression Statement: Added sitting inversion to program; Decreased vector stance to 5 seconds as 10 seconds was making pt cramp.  We will hold advancing any further until pt has no cramping during or after treatment.  PT Plan: Continue with current PT POC to improve ankle/knee/hip strength and stability.          Problem List Patient Active Problem List   Diagnosis Date Noted  . GERD (gastroesophageal reflux disease) 12/31/2012  . Atypical chest pain 08/28/2010  . Hypercholesteremia 08/28/2010       GP    Ether Goebel,CINDY 11/05/2013, 8:46 AM

## 2013-11-09 ENCOUNTER — Ambulatory Visit (HOSPITAL_COMMUNITY)
Admission: RE | Admit: 2013-11-09 | Discharge: 2013-11-09 | Disposition: A | Discharge status: Home / Self Care | Payer: PRIVATE HEALTH INSURANCE | Source: Ambulatory Visit | Attending: Orthopedic Surgery | Admitting: Orthopedic Surgery

## 2013-11-09 DIAGNOSIS — IMO0001 Reserved for inherently not codable concepts without codable children: Secondary | ICD-10-CM | POA: Diagnosis not present

## 2013-11-09 NOTE — Progress Notes (Signed)
Physical Therapy Treatment Patient Details  Name: Patrick Luna MRN: [id number] Date of Birth: November 07, 1945  Today's Date: 11/09/2013 Time:  -     Visit#: 11 of 24  Re-eval: 11/09/13    Authorization: [employer]  Subjective: Symptoms/Limitations Symptoms: Pt states that he had no cramping over the weekend  Pain Assessment Currently in Pain?: No/denies Exercise/Treatments       Ankle Stretches Plantar Fascia Stretch: 3 reps;20 seconds Soleus Stretch: Limitations Gastroc Stretch: Limitations Gastroc Stretch Limitations: 3 way 20 seconds 4x   Ankle Exercises - Standing BAPS: Level 3;Standing;10 reps Vector Stance:  (10 seconds x 2) Rocker Board: 2 minutes Rocker Board Limitations: R/L and A/P Heel Raises: 15 reps Toe Raise: 15 reps Other Standing Ankle Exercises: steps 4' 3 RT exaggerating heel lift/knee bend  Ankle Exercises - Seated Other Seated Ankle Exercises: cybes leg press using ankle 4 Pl x 15  Other Seated Ankle Exercises: inversion x 10 with 4#    Physical Therapy Assessment and Plan PT Assessment and Plan Clinical Impression Statement: Added weights to sitting without difficulty; cybex plantaflextion Rt only with assist for return, Attempted double heelraise with Rt leg lowering but pt does not have the strength at this time.  Pt give blue t-band for home use (has green) PT Plan: need re-evaluation next treatment .  Try 6" steps next treatment.      Problem List Patient Active Problem List   Diagnosis Date Noted  . GERD (gastroesophageal reflux disease) 12/31/2012  . Atypical chest pain 08/28/2010  . Hypercholesteremia 08/28/2010       GP    RUSSELL,CINDY 11/09/2013, 8:47 AM

## 2013-11-11 ENCOUNTER — Ambulatory Visit (HOSPITAL_COMMUNITY)
Admission: RE | Admit: 2013-11-11 | Discharge: 2013-11-11 | Disposition: A | Discharge status: Home / Self Care | Payer: PRIVATE HEALTH INSURANCE | Source: Ambulatory Visit | Attending: Orthopedic Surgery | Admitting: Orthopedic Surgery

## 2013-11-11 DIAGNOSIS — IMO0001 Reserved for inherently not codable concepts without codable children: Secondary | ICD-10-CM | POA: Diagnosis not present

## 2013-11-11 NOTE — Evaluation (Addendum)
Physical Therapy Reassessment  Patient Details  Name: Patrick Luna MRN: [id number] Date of Birth: [birth year]  Today's Date: 11/11/2013 Time: 0800-0845 PT Time Calculation (min): 45 min    Charges: 1 ROMM, 1 MMT, TE [id number]         Visit#: 12 of 24  Re-eval: 12/11/13 Assessment Diagnosis: Rt achilles tendon repair,  Surgical Date: 07/23/13 Next MD Visit: Kathryne Hitch, 6 weeks  Authorization: [employer]     Past Medical History:  Past Medical History  Diagnosis Date  . Hypercholesterolemia   . Chronic sinusitis   . Hepatic steatosis   . GERD (gastroesophageal reflux disease)    Past Surgical History:  Past Surgical History  Procedure Laterality Date  . Colonoscopy  2007  . Cystectomy      BENIGN CYST REMOVAL FROM RIGHT SHOULDER  . Esophageal manometry N/A 02/02/2013    Procedure: ESOPHAGEAL MANOMETRY (EM);  Surgeon: Cleotis Nipper, MD;  Location: [facility];  Service: Endoscopy;  Laterality: N/A;  . Hand debridement  2009    left  . Excision haglund's deformity with achilles tendon repair Right 07/23/2013    Procedure: RIGHT FOOT: EXCISION PARTIAL BONE TALUS/CALCANEUS, REPAIR RUPTURE  ACHILLES TENDON PRIMARY OPEN/PERCUTANEOUS;  Surgeon: Ninetta Lights, MD;  Location: [place];  Service: Orthopedics;  Laterality: Right;    Subjective Symptoms/Limitations Symptoms: Patient states minimal pain "not enough to rate." Notes most difficulty in medial-l;ateral ankle stability and pain/difficulty with heel raises and coming up on toes.  Pain Assessment Currently in Pain?: No/denies  Sensation/Coordination/Flexibility/Functional Tests Functional Tests Functional Tests: Ely test positive (Ely test positive) Functional Tests: pain heel with Rt LE during barefoot toe raises  Assessment RLE AROM (degrees) Right Ankle Dorsiflexion: 20 Right Ankle Plantar Flexion: 60 Right Ankle Inversion: 35 Right Ankle Eversion: 30 RLE Strength Right Hip Flexion:  5/5 Right Hip External Rotation : 50 Right Hip Internal Rotation : 15 Right Knee Flexion: 5/5 Right Knee Extension: 5/5 Right Ankle Dorsiflexion: 5/5 Right Ankle Plantar Flexion: 3+/5 Right Ankle Inversion: 5/5 Right Ankle Eversion: 5/5 LLE AROM (degrees) Left Hip External Rotation : 60 (was 40) Left Hip Internal Rotation : 20 (was 15) Left Ankle Dorsiflexion: 15 (was 8) Left Ankle Plantar Flexion: 55 (was 40) LLE Strength Left Hip Flexion: 5/5 Left Hip Extension: 5/5 (was 4/5) Left Hip ABduction: 4/5 Left Knee Flexion: 5/5 Left Knee Extension: 5/5 Left Ankle Dorsiflexion: 5/5 Left Ankle Plantar Flexion: 4/5 Left Ankle Inversion: 4/5 Left Ankle Eversion: 4/5  Exercise/Treatments Ankle Stretches Plantar Fascia Stretch: 3 reps;20 seconds Soleus Stretch: Limitations Soleus Stretch Limitations: 3way 10x 3" hold Gastroc Stretch: Limitations Gastroc Stretch Limitations: 3 way 10x 3" hold Other Stretch: Prone quad stretch 4x 20 seconds Ankle Exercises - Standing SLS: balance reach matrix common 5x; Heel Raises: 10 reps Heel Raises Limitations: 2 sets, 1 set on wedge Toe Raise: 15 reps Other Standing Ankle Exercises: Squat: nmeutral, internal rotation, external rotation on BOSU ball 5x each Ankle Exercises - Seated Other Seated Ankle Exercises: cybex leg press using ankle 5 Pl x 15  (progress to 6 pl next session)  Physical Therapy Assessment and Plan PT Assessment and Plan Clinical Impression Statement: Reassessment completed patient reassessment today displaying increased ROM in Rt LE and ankle and improving strength. Patient conitnues to have limited hip internal rotation and limted Rt ankle plantar flexion strength for whcih remainder of session focused on. Patient demosntrated significant difficulty with single leg heel raises and single leg balance reaches on rt LE  compared to lt.  PT Plan: Continue physical therapy 2x a week for 4 more weeks to progress strength and begin  agility work. Next session CONTINUE/progress single legh balance reach matrix to common and uncommon directions, progress to squat matrix on BOSU 5x each. Exercises to emphasize control on unstable surfaces.    Goals PT Short Term Goals PT Short Term Goal 1: Patient will be able to plantar flex Rt foot 30 degrees to normalize stride length PT Short Term Goal 1 - Progress: Met PT Short Term Goal 2: Patient will be able to demonstrate 3/5 MMT gastroc strength.  PT Short Term Goal 3: Patient will be able to dorsiflex great toe to >45 degrees to decrease early toe off during gait.  PT Short Term Goal 3 - Progress: Progressing toward goal PT Short Term Goal 4: Patient will be able to stand with 50% of his body weight on his Rt foot to beable to tstand > 10 minutes without pain >3/10 PT Short Term Goal 4 - Progress: Met PT Long Term Goals PT Long Term Goal 1: Patient will be ble to plantar flex Rt foot 45 degrees to stand on toes with full weight bearign to stna on toes. PT Long Term Goal 1 - Progress: Met PT Long Term Goal 2: Patient will be able to stand with 100% weight bearign on Rt foot with Pain <2/10. PT Long Term Goal 2 - Progress: Met Long Term Goal 3: Patient will be able to demsontrate 4/5 MMT for gastroc strength to be able to increase push off durign gait. 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 Long Term Goal 4 Progress: Progressing toward goal PT Long Term Goal 5: Patient will demosntrate 5/5 MMT  for glut max/med indicatign improved hip stability needed for running and cutting.  Long Term Goal 5 Progress: Progressing toward goal  Problem List Patient Active Problem List   Diagnosis Date Noted  . GERD (gastroesophageal reflux disease) 12/31/2012  . Atypical chest pain 08/28/2010  . Hypercholesteremia  08/28/2010    PT - End of Session Activity Tolerance: Patient tolerated treatment well General Behavior During Therapy: [facility] for tasks assessed/performed  GP    Clint Biello R 11/11/2013, 8:48 AM  Physician Documentation Your signature is required to indicate approval of the treatment plan as stated above.  Please sign and either send electronically or make a copy of this report for your files and return this physician signed original.   Please mark one 1.__approve of plan  2. ___approve of plan with the following conditions.   ______________________________                                                          _____________________ Physician Signature  Date  

## 2013-11-16 ENCOUNTER — Ambulatory Visit (HOSPITAL_COMMUNITY)
Admission: RE | Admit: 2013-11-16 | Discharge: 2013-11-16 | Disposition: A | Discharge status: Home / Self Care | Payer: PRIVATE HEALTH INSURANCE | Source: Ambulatory Visit | Attending: Family Medicine | Admitting: Family Medicine

## 2013-11-16 DIAGNOSIS — M79671 Pain in right foot: Secondary | ICD-10-CM | POA: Insufficient documentation

## 2013-11-16 DIAGNOSIS — Z5189 Encounter for other specified aftercare: Secondary | ICD-10-CM | POA: Diagnosis not present

## 2013-11-16 DIAGNOSIS — R262 Difficulty in walking, not elsewhere classified: Secondary | ICD-10-CM | POA: Insufficient documentation

## 2013-11-16 NOTE — Progress Notes (Addendum)
Physical Therapy Treatment Patient Details  Name: Patrick Luna MRN: [id number] Date of Birth: Nov 04, 1945  Today's Date: 11/16/2013 Time: 0801-0845 PT Time Calculation (min): 44 min   Charges: TE [phone], manual [phone] Visit#: 13 of 24  Re-eval: 12/11/13 Assessment Diagnosis: Rt achilles tendon repair,  Surgical Date: 07/23/13 Next MD Visit: Kathryne Hitch, 6 weeks  Authorization: [employer]   Subjective: Symptoms/Limitations Symptoms: Patient states he is feeling good today.  Pain Assessment Currently in Pain?: No/denies  Exercise/Treatments Ankle Stretches Soleus Stretch: Limitations Soleus Stretch Limitations: 3way 2x 20" hold each Gastroc Stretch: Limitations Gastroc Stretch Limitations: 3 way 2x 20" hold each Other Stretch: Prone quad stretch 4x 20 seconds Ankle Exercises - Standing Vector Stance Limitations: 3 way flmengo 5x SLS: single leg balance reach matrix common with finger tip support 5x; Rocker Board Limitations: wobble board 10x CW/CCW Other Standing Ankle Exercises: Squat: neutral, internal rotation, external rotation, feet together, split stance on BOSU ball 5x each Other Standing Ankle Exercises: 3D ankle excursion on airex 10x Ankle Exercises - Seated Other Seated Ankle Exercises: cybex leg press using ankle 6 Pl x 15  (progress to 6 pl next session)  Manual: myofascial release of bilateral gastrocs,   Physical Therapy Assessment and Plan PT Assessment and Plan Clinical Impression Statement: Patient dmeosntrated good perofrmance of progressed exercises this session with patient noting no pain throughout. Noted fascial restrinctions in bilateral gastrocs during myofascial release of bilateral gastronc with Rt having significant limitations in lateral gastroc belly.  PT Plan: Continue  to progress strength and begin agility work. Next session CONTINUE/progress single leg balance reach matrix to common and uncommon directions, introduce agility ladder, still  no jumping or hopping    Goals PT Short Term Goals PT Short Term Goal 2: Patient will be able to demonstrate 3/5 MMT gastroc strength.  PT Short Term Goal 2 - Progress: Progressing toward goal PT Short Term Goal 3: Patient will be able to dorsiflex great toe to >45 degrees to decrease early toe off during gait.  PT Short Term Goal 3 - Progress: Progressing toward goal PT Short Term Goal 5: Patient will be able to internally rotate bilateral hips >35 degrees to increase shock absorbtion during gait.  PT Short Term Goal 5 - Progress: Progressing toward goal  Problem List Patient Active Problem List   Diagnosis Date Noted  . GERD (gastroesophageal reflux disease) 12/31/2012  . Atypical chest pain 08/28/2010  . Hypercholesteremia 08/28/2010    PT - End of Session Activity Tolerance: Patient tolerated treatment well General Behavior During Therapy: [facility] for tasks assessed/performed  GP    Caliegh Middlekauff R 11/16/2013, 8:45 AM

## 2013-11-18 ENCOUNTER — Ambulatory Visit (HOSPITAL_COMMUNITY)
Admission: RE | Admit: 2013-11-18 | Discharge: 2013-11-18 | Disposition: A | Discharge status: Home / Self Care | Payer: PRIVATE HEALTH INSURANCE | Source: Ambulatory Visit | Attending: Orthopedic Surgery | Admitting: Orthopedic Surgery

## 2013-11-18 DIAGNOSIS — Z5189 Encounter for other specified aftercare: Secondary | ICD-10-CM | POA: Diagnosis not present

## 2013-11-18 NOTE — Progress Notes (Signed)
Physical Therapy Treatment Patient Details  Name: Patrick Luna MRN: [id number] Date of Birth: [birth year]  Today's Date: 11/18/2013 Time: 0800-0845 PT Time Calculation (min): 45 min   Charges: TE [phone]  Visit#: 23 of 24  Re-eval: 12/11/13 Assessment Diagnosis: Rt achilles tendon repair,  Surgical Date: 07/23/13 Next MD Visit: Kathryne Hitch, 6 weeks  Authorization: [employer]   Subjective: Symptoms/Limitations Symptoms: Patient states he is feeling good today. no pain Pain Assessment Currently in Pain?: No/denies Pain Score: 0-No pain  Exercise/Treatments Ankle Stretches Soleus Stretch: Limitations Soleus Stretch Limitations: 3way 10x 3" hold each Gastroc Stretch: Limitations Gastroc Stretch Limitations: 3 way 10x3" hold each Ankle Plyometrics Plyometric Exercises: Agility ladder 10 minutes Ankle Exercises - Standing Vector Stance Limitations: 3 way flmengo 5x SLS: single leg balance reach matrix with finger tip support 5x; Heel Raises: 10 reps Heel Raises Limitations: 2 sets, 1 set on wedge Other Standing Ankle Exercises: Squat: neutral, internal rotation, external rotation, feet together, split stance on BOSU ball 5x each Other Standing Ankle Exercises: 3D ankle excursion on airex 10x Ankle Exercises - Seated Other Seated Ankle Exercises: cybex leg press using ankle 7 Pl x 15  (progress to 6 pl next session)  Physical Therapy Assessment and Plan PT Assessment and Plan Clinical Impression Statement: Patient demonstrated good performance of progressed exercises this session with patient noting no pain throughout. Patient was able to perform single leg balance reach matrix all 6 direction with no pain except when going to far forward which resulted in patient's heel coming up. Patient was also able to begin agility and coordination training this session with performance of the agility ladder which de demonstrated good learning, but slow performance.  PT Plan: Continue  to  progress strength and agility exercises. Next session CONTINUE/progress single leg balance reach matrix to common and uncommon directionsto only one hand hold support, still no jumping or hopping. Continue progressing resistance with with cybex 1 plate each session as tolerated    Goals PT Short Term Goals PT Short Term Goal 2: Patient will be able to demonstrate 3/5 MMT gastroc strength.  PT Short Term Goal 2 - Progress: Progressing toward goal PT Short Term Goal 3: Patient will be able to dorsiflex great toe to >45 degrees to decrease early toe off during gait.  PT Short Term Goal 3 - Progress: Progressing toward goal PT Short Term Goal 5: Patient will be able to internally rotate bilateral hips >35 degrees to increase shock absorbtion during gait.  PT Short Term Goal 5 - Progress: Progressing toward goal PT Long Term Goals Long Term Goal 3: Patient will be able to demsontrate 4/5 MMT for gastroc strength to be able to increase push off durign gait. 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 Long Term Goal 4 Progress: Progressing toward goal PT Long Term Goal 5: Patient will demosntrate 5/5 MMT  for glut max/med indicatign improved hip stability needed for running and cutting.  Long Term Goal 5 Progress: Progressing toward goal  Problem List Patient Active Problem List   Diagnosis Date Noted  . GERD (gastroesophageal reflux disease) 12/31/2012  . Atypical chest pain 08/28/2010  . Hypercholesteremia 08/28/2010    PT - End of Session Activity Tolerance: Patient tolerated treatment well General Behavior During Therapy: [facility] for tasks assessed/performed  GP    Jenafer Winterton R  11/18/2013, 9:01 AM

## 2013-11-23 ENCOUNTER — Ambulatory Visit (HOSPITAL_COMMUNITY)
Admission: RE | Admit: 2013-11-23 | Discharge: 2013-11-23 | Disposition: A | Discharge status: Home / Self Care | Payer: PRIVATE HEALTH INSURANCE | Source: Ambulatory Visit | Attending: Orthopedic Surgery | Admitting: Orthopedic Surgery

## 2013-11-23 DIAGNOSIS — Z5189 Encounter for other specified aftercare: Secondary | ICD-10-CM | POA: Diagnosis not present

## 2013-11-23 NOTE — Progress Notes (Signed)
Physical Therapy Treatment Patient Details  Name: Patrick Luna MRN: [id number] Date of Birth: Nov 20, 1945  Today's Date: 11/23/2013 Time: 0800-0843 PT Time Calculation (min): 43 min Charge: TE [id number]  Visit#: 15 of 24  Re-eval: 12/11/13 Assessment Diagnosis: Rt achilles tendon repair,  Surgical Date: 07/23/13 Next MD Visit: Kathryne Hitch 11/27/2013  Authorization: [employer] Falling  Authorization Time Period:    Authorization Visit#:   of     Subjective: Symptoms/Limitations Symptoms: Ankle is feeling good today, no pain.   Pain Assessment Currently in Pain?: No/denies  Objective:   Exercise/Treatments Ankle Stretches Soleus Stretch: Limitations Soleus Stretch Limitations: 3way 10x 3" hold each Gastroc Stretch: Limitations Gastroc Stretch Limitations: 3 way 10x3" hold each Machines for Strengthening Cybex Leg Press: cybex leg press for plantar and eccentric dorsiflexion 2 sets:10 reps 1st set 7 Pl ; 2nd set 8Pl 10x Ankle Plyometrics Plyometric Exercises: Agility ladder 10 minutes Ankle Exercises - Standing SLS: single leg balance reach matrix with finger tip support 5x; Heel Raises: 15 reps Heel Raises Limitations: on wedge Toe Raise: 15 reps Other Standing Ankle Exercises: Squat: neutral, internal rotation, external rotation, feet together, split stance on BOSU ball 10x each Other Standing Ankle Exercises: 3D ankle excursion on airex 10x Ankle Exercises - Seated Other Seated Ankle Exercises: cybex leg press using ankle 7 Pl x 15   Physical Therapy Assessment and Plan PT Assessment and Plan Clinical Impression Statement: Session focus on ankle strengthning activities including ankle excursion and squats on dynamic sufrace for stabilty and agaility ladder for coordination.  Pt able to demosntrate appropriate technqiues with agility ladder but slow performance due to weakness.  Pt limited by fatiuge at end of session, no repiorts of pain through session.   PT Plan:  Re-assess prior MD apt next session.     Goals PT Short Term Goals PT Short Term Goal 1: Patient will be able to plantar flex Rt foot 30 degrees to normalize stride length PT Short Term Goal 2: Patient will be able to demonstrate 3/5 MMT gastroc strength.  PT Short Term Goal 2 - Progress: Progressing toward goal PT Short Term Goal 3: Patient will be able to dorsiflex great toe to >45 degrees to decrease early toe off during gait.  PT Short Term Goal 3 - Progress: Progressing toward goal PT Short Term Goal 4: Patient will be able to stand with 50% of his body weight on his Rt foot to beable to tstand > 10 minutes without pain >3/10 PT Short Term Goal 5: Patient will be able to internally rotate bilateral hips >35 degrees to increase shock absorbtion during gait.  PT Long Term Goals PT Long Term Goal 1: Patient will be ble to plantar flex Rt foot 45 degrees to stand on toes with full weight bearign to stna on toes. PT Long Term Goal 2: Patient will be able to stand with 100% weight bearign on Rt foot with Pain <2/10. Long Term Goal 3: Patient will be able to demsontrate 4/5 MMT for gastroc strength to be able to increase push off durign gait. 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 Long Term Goal 4 Progress: Progressing toward goal PT Long Term Goal 5: Patient will demosntrate 5/5 MMT  for glut max/med indicatign improved hip stability needed for running and cutting.  Long Term Goal 5 Progress: Progressing toward goal  Problem List Patient Active Problem List  Diagnosis Date Noted  . GERD (gastroesophageal reflux disease) 12/31/2012  . Atypical chest pain 08/28/2010  . Hypercholesteremia 08/28/2010    PT - End of Session Activity Tolerance: Patient tolerated treatment well General Behavior During Therapy: [facility] for tasks assessed/performed  GP     Aldona Lento 11/23/2013, 9:10 AM

## 2013-11-26 ENCOUNTER — Ambulatory Visit (HOSPITAL_COMMUNITY)
Admission: RE | Admit: 2013-11-26 | Discharge: 2013-11-26 | Disposition: A | Discharge status: Home / Self Care | Payer: PRIVATE HEALTH INSURANCE | Source: Ambulatory Visit | Attending: Orthopedic Surgery | Admitting: Orthopedic Surgery

## 2013-11-26 DIAGNOSIS — Z5189 Encounter for other specified aftercare: Secondary | ICD-10-CM | POA: Diagnosis not present

## 2013-11-26 NOTE — Evaluation (Signed)
Physical Therapy Evaluation  Patient Details  Name: Patrick Luna MRN: [id number] Date of Birth: 06-17-1945  Today's Date: 11/26/2013 Time: 0800-0845 PT Time Calculation (min): 45 min    Charges: TE [id number], 1 MMT, 1 ROMM          Visit#: 16 of 24  Re-eval: 12/11/13 Assessment Diagnosis: Rt achilles tendon repair,  Surgical Date: 07/23/13 Next MD Visit: Kathryne Hitch 11/27/2013  Authorization: [employer] Falling     Past Medical History:  Past Medical History  Diagnosis Date  . Hypercholesterolemia   . Chronic sinusitis   . Hepatic steatosis   . GERD (gastroesophageal reflux disease)    Past Surgical History:  Past Surgical History  Procedure Laterality Date  . Colonoscopy  2007  . Cystectomy      BENIGN CYST REMOVAL FROM RIGHT SHOULDER  . Esophageal manometry N/A 02/02/2013    Procedure: ESOPHAGEAL MANOMETRY (EM);  Surgeon: Cleotis Nipper, MD;  Location: [facility];  Service: Endoscopy;  Laterality: N/A;  . Hand debridement  2009    left  . Excision haglund's deformity with achilles tendon repair Right 07/23/2013    Procedure: RIGHT FOOT: EXCISION PARTIAL BONE TALUS/CALCANEUS, REPAIR RUPTURE  ACHILLES TENDON PRIMARY OPEN/PERCUTANEOUS;  Surgeon: Ninetta Lights, MD;  Location: [place];  Service: Orthopedics;  Laterality: Right;    Subjective Symptoms/Limitations Symptoms: Patient notes increased soreness in Rt thigh this session.  Pain Assessment Currently in Pain?: No/denies  Assessment RLE AROM (degrees) Right Ankle Dorsiflexion: 24 Right Ankle Plantar Flexion: 60 Right Ankle Inversion: 35 Right Ankle Eversion: 30 RLE Strength Right Hip Flexion: 5/5 Right Hip External Rotation : 50 Right Hip Internal Rotation : 18 Right Knee Flexion: 5/5 Right Knee Extension: 5/5 Right Ankle Dorsiflexion: 5/5 Right Ankle Plantar Flexion:  (4-/5) Right Ankle Inversion: 5/5 Right Ankle Eversion: 5/5 LLE AROM (degrees) Left Hip External Rotation : 60 (was  40) Left Hip Internal Rotation : 25 Left Ankle Dorsiflexion: 16 Left Ankle Plantar Flexion: 55 (was 40) LLE Strength Left Hip Flexion: 5/5 Left Hip Extension: 5/5 (was 4/5) Left Knee Flexion: 5/5 Left Knee Extension: 5/5 Left Ankle Dorsiflexion: 5/5 Left Ankle Plantar Flexion: 5/5 Left Ankle Inversion: 5/5 Left Ankle Eversion: 5/5  Exercise/Treatments Ankle Stretches Soleus Stretch: Limitations Soleus Stretch Limitations: 3way 10x 3" hold each Gastroc Stretch: Limitations Gastroc Stretch Limitations: 3 way 10x3" hold each Other Stretch: Prone quad stretch 4x 20 seconds Other Stretch: Hip flexor stretch to 14" box 2x 20"  Ankle Plyometrics Plyometric Exercises: Agility ladder 10 minutes Ankle Exercises - Standing SLS: single leg balance reach matrix with finger tip support 5x; Heel Raises Limitations: 10x 2 sets on wedge Toe Raise: 15 reps Other Standing Ankle Exercises: Squat: neutral, internal rotation, external rotation, feet together, split stance on BOSU ball 10x each Other Standing Ankle Exercises: 3D ankle excursion on airex 10x 3 way flamengo 3x  Ankle Exercises - Seated Other Seated Ankle Exercises: cybex leg press using ankle 8 Pl x 15 two down Rt up.    Physical Therapy Assessment and Plan PT Assessment and Plan Clinical Impression Statement: Reassessment performed with aptient meeting 4/5 short term goals and 2/5 long term goals with remainign goals being strength related with lack of progress attributed to strength gains taking naturally more time to improve. Patient conitnues to lack strength required for runningand cutting though patient displasy significantly improved strength as he is now able to perform single leg calf raises indicating improving strngth as prior to this reassessment patient was  unable to perform sinlge calf raises at all.  This session focused on further progressing LE strength and stability to improve balance.  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 Next session introduce calf raise matrix. introduce  common lunge matrix, continue squat walk arouind to increase hip internal rotation.     Goals PT Short Term Goals PT Short Term Goal 1: Patient will be able to plantar flex Rt foot 30 degrees to normalize stride length PT Short Term Goal 1 - Progress: Met PT Short Term Goal 2: Patient will be able to demonstrate 3/5 MMT gastroc strength.  PT Short Term Goal 2 - Progress: Met PT Short Term Goal 3: Patient will be able to dorsiflex great toe to >45 degrees to decrease early toe off during gait.  PT Short Term Goal 3 - Progress: Met PT Short Term Goal 4: Patient will be able to stand with 50% of his body weight on his Rt foot to beable to tstand > 10 minutes without pain >3/10 PT Short Term Goal 4 - Progress: Met PT Short Term Goal 5: Patient will be able to internally rotate bilateral hips >35 degrees to increase shock absorbtion during gait.  PT Short Term Goal 5 - Progress: Progressing toward goal PT Long Term Goals PT Long Term Goal 1: Patient will be ble to plantar flex Rt foot 45 degrees to stand on toes with full weight bearign to stna on toes. PT Long Term Goal 1 - Progress: Met PT Long Term Goal 2: Patient will be able to stand with 100% weight bearign on Rt foot with Pain <2/10. PT Long Term Goal 2 - Progress: Met Long Term Goal 3: Patient will be able to demsontrate 4/5 MMT for gastroc strength to be able to increase push off durign gait. 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 Long Term Goal 4 Progress: Progressing toward goal PT Long Term Goal 5: Patient will demosntrate 5/5 MMT  for glut max/med indicatign improved hip  stability needed for running and cutting.  Long Term Goal 5 Progress: Progressing toward goal  Problem List Patient Active Problem List   Diagnosis Date Noted  . GERD (gastroesophageal reflux disease) 12/31/2012  . Atypical chest pain 08/28/2010  . Hypercholesteremia 08/28/2010    PT - End of Session Activity Tolerance: Patient tolerated treatment well General Behavior During Therapy: [facility] for tasks assessed/performed  GP    Madgie Dhaliwal R 11/26/2013, 9:13 AM  Physician Documentation Your signature is required to indicate approval of the treatment plan as stated above.  Please sign and either send electronically or make a copy of this report for your files and return this physician signed original.   Please mark one 1.__approve of plan  2. ___approve of plan with the following conditions.   ______________________________                                                          _____________________ Physician Signature  Date  

## 2013-12-01 ENCOUNTER — Inpatient Hospital Stay (HOSPITAL_COMMUNITY): Admission: RE | Admit: 2013-12-01 | Payer: Medicare HMO | Source: Ambulatory Visit | Admitting: Physical Therapy

## 2014-03-24 ENCOUNTER — Encounter: Payer: Self-pay | Admitting: Family Medicine

## 2014-04-02 ENCOUNTER — Encounter: Payer: Self-pay | Admitting: Family Medicine

## 2014-04-02 DIAGNOSIS — K224 Dyskinesia of esophagus: Secondary | ICD-10-CM | POA: Insufficient documentation

## 2014-04-02 DIAGNOSIS — K229 Disease of esophagus, unspecified: Secondary | ICD-10-CM

## 2014-07-16 ENCOUNTER — Telehealth: Payer: Self-pay | Admitting: *Deleted

## 2014-07-16 ENCOUNTER — Encounter: Payer: Self-pay | Admitting: Family Medicine

## 2014-07-16 NOTE — Telephone Encounter (Signed)
Called pt to let him know we have his letter for [employer] summons and informed him that it will up front ready for pick up

## 2014-11-19 ENCOUNTER — Encounter: Payer: Self-pay | Admitting: Family Medicine

## 2014-11-19 ENCOUNTER — Ambulatory Visit (INDEPENDENT_AMBULATORY_CARE_PROVIDER_SITE_OTHER): Payer: PPO | Admitting: Family Medicine

## 2014-11-19 VITALS — BP 130/72 | HR 78 | Temp 98.7°F | Resp 16 | Ht 66.0 in | Wt 228.0 lb

## 2014-11-19 DIAGNOSIS — R81 Glycosuria: Secondary | ICD-10-CM

## 2014-11-19 DIAGNOSIS — Z13220 Encounter for screening for lipoid disorders: Secondary | ICD-10-CM

## 2014-11-19 DIAGNOSIS — N3001 Acute cystitis with hematuria: Secondary | ICD-10-CM

## 2014-11-19 DIAGNOSIS — Z125 Encounter for screening for malignant neoplasm of prostate: Secondary | ICD-10-CM

## 2014-11-19 DIAGNOSIS — R350 Frequency of micturition: Secondary | ICD-10-CM

## 2014-11-19 LAB — URINALYSIS, ROUTINE W REFLEX MICROSCOPIC
BILIRUBIN URINE: NEGATIVE
Ketones, ur: NEGATIVE
Leukocytes, UA: NEGATIVE
Nitrite: NEGATIVE
Protein, ur: NEGATIVE
Specific Gravity, Urine: 1.025 (ref 1.001–1.035)
pH: 5.5 (ref 5.0–8.0)

## 2014-11-19 LAB — URINALYSIS, MICROSCOPIC ONLY
CASTS: NONE SEEN [LPF]
CRYSTALS: NONE SEEN [HPF]
WBC UA: NONE SEEN WBC/HPF (ref <=5)
Yeast: NONE SEEN [HPF]

## 2014-11-19 MED ORDER — CIPROFLOXACIN HCL 500 MG PO TABS: 500.0000 mg | ORAL_TABLET | Freq: Two times a day (BID) | ORAL | Status: DC

## 2014-11-19 NOTE — Progress Notes (Signed)
Patient ID: Patrick Luna, male   DOB: 1945-03-09, 69 y.o.   MRN: [id number]   Subjective:    Patient ID: Patrick Luna, male    DOB: 04-Oct-1945, 69 y.o.   MRN: [id number]  Patient presents for Urinary Frequency  here with urinary frequency and some incontinence mild dysuria. He is also seeing some blood in his urine. He thinks that he recently passed a kidney stones he has history of multiple kidney stones in the past does not seek care for them any longer. He denies any fever abdominal pain back pain. He has not been to our office in about 2 years. He is being followed by Dr. Burney Gauze because of her fracture fifth digit.    Review Of Systems:  GEN- denies fatigue, fever, weight loss,weakness, recent illness HEENT- denies eye drainage, change in vision, nasal discharge, CVS- denies chest pain, palpitations RESP- denies SOB, cough, wheeze ABD- denies N/V, change in stools, abd pain GU- + dysuria,+ hematuria, dribbling, incontinence MSK- denies joint pain, muscle aches, injury Neuro- denies headache, dizziness, syncope, seizure activity       Objective:    BP 130/72 mmHg  Pulse 78  Temp(Src) 98.7 F (37.1 C) (Oral)  Resp 16  Ht 5\' 6"  (1.676 m)  Wt 228 lb (103.42 kg)  BMI 36.82 kg/m2 GEN- NAD, alert and oriented x3 HEENT- PERRL, EOMI, non injected sclera, pink conjunctiva, MMM, oropharynx clear CVS- RRR, no murmur RESP-CTAB ABD-NABS,soft,NT,ND, no CVA tenderness  EXT- No edema Pulses- Radial  2+        Assessment & Plan:      Problem List Items Addressed This Visit    None    Visit Diagnoses    Urinary frequency    -  Primary    Culture sent, with     Relevant Orders    Urinalysis, Routine w reflex microscopic (not at [facility]) (Completed)    Urine culture    Acute cystitis with hematuria        With actual hematuria, possible stone vs infection, will start Cipro BID , culture sent, Also recommend he come in for fasting labs, glucose was noted in his urine.  Other differential prostatitis, of BPH.  Will also give Flomax since he does have problems with stones       Note: This dictation was prepared with Dragon dictation along with smaller phrase technology. Any transcriptional errors that result from this process are unintentional.

## 2014-11-19 NOTE — Patient Instructions (Signed)
Take antibiotics as prescribed Take Flomax  We will call with results F/U schedule a physical- needs fasting labs

## 2014-11-20 LAB — URINE CULTURE
COLONY COUNT: NO GROWTH
Organism ID, Bacteria: NO GROWTH

## 2014-12-06 ENCOUNTER — Other Ambulatory Visit: Payer: PRIVATE HEALTH INSURANCE

## 2014-12-06 DIAGNOSIS — Z125 Encounter for screening for malignant neoplasm of prostate: Secondary | ICD-10-CM

## 2014-12-06 DIAGNOSIS — Z13220 Encounter for screening for lipoid disorders: Secondary | ICD-10-CM

## 2014-12-06 DIAGNOSIS — R81 Glycosuria: Secondary | ICD-10-CM

## 2014-12-07 ENCOUNTER — Other Ambulatory Visit: Payer: PRIVATE HEALTH INSURANCE

## 2014-12-07 LAB — CBC WITH DIFFERENTIAL/PLATELET
Basophils Absolute: 0 10*3/uL (ref 0.0–0.1)
Basophils Relative: 1 % (ref 0–1)
EOS PCT: 3 % (ref 0–5)
Eosinophils Absolute: 0.1 10*3/uL (ref 0.0–0.7)
HEMATOCRIT: 43.8 % (ref 39.0–52.0)
Hemoglobin: 15.1 g/dL (ref 13.0–17.0)
LYMPHS PCT: 36 % (ref 12–46)
Lymphs Abs: 1.3 10*3/uL (ref 0.7–4.0)
MCH: 30.8 pg (ref 26.0–34.0)
MCHC: 34.5 g/dL (ref 30.0–36.0)
MCV: 89.4 fL (ref 78.0–100.0)
MONO ABS: 0.5 10*3/uL (ref 0.1–1.0)
MPV: 10.4 fL (ref 8.6–12.4)
Monocytes Relative: 13 % — ABNORMAL HIGH (ref 3–12)
Neutro Abs: 1.7 10*3/uL (ref 1.7–7.7)
Neutrophils Relative %: 47 % (ref 43–77)
Platelets: 210 10*3/uL (ref 150–400)
RBC: 4.9 MIL/uL (ref 4.22–5.81)
RDW: 14.4 % (ref 11.5–15.5)
WBC: 3.7 10*3/uL — AB (ref 4.0–10.5)

## 2014-12-07 LAB — COMPREHENSIVE METABOLIC PANEL
ALT: 32 U/L (ref 9–46)
AST: 30 U/L (ref 10–35)
Albumin: 4 g/dL (ref 3.6–5.1)
Alkaline Phosphatase: 57 U/L (ref 40–115)
BUN: 10 mg/dL (ref 7–25)
CALCIUM: 9.4 mg/dL (ref 8.6–10.3)
CHLORIDE: 103 mmol/L (ref 98–110)
CO2: 25 mmol/L (ref 20–31)
Creat: 0.83 mg/dL (ref 0.70–1.25)
GLUCOSE: 124 mg/dL — AB (ref 70–99)
POTASSIUM: 4.2 mmol/L (ref 3.5–5.3)
Sodium: 140 mmol/L (ref 135–146)
Total Bilirubin: 0.8 mg/dL (ref 0.2–1.2)
Total Protein: 6.6 g/dL (ref 6.1–8.1)

## 2014-12-07 LAB — LIPID PANEL
CHOLESTEROL: 102 mg/dL — AB (ref 125–200)
HDL: 42 mg/dL (ref >=40)
LDL Cholesterol: 50 mg/dL (ref <130)
TRIGLYCERIDES: 48 mg/dL (ref <150)
Total CHOL/HDL Ratio: 2.4 Ratio (ref <=5.0)
VLDL: 10 mg/dL (ref <30)

## 2014-12-07 LAB — HEMOGLOBIN A1C
Hgb A1c MFr Bld: 8 % — ABNORMAL HIGH (ref <5.7)
Mean Plasma Glucose: 183 mg/dL — ABNORMAL HIGH (ref <117)

## 2014-12-08 ENCOUNTER — Other Ambulatory Visit: Payer: Self-pay | Admitting: *Deleted

## 2014-12-08 LAB — PSA: PSA: 0.64 ng/mL (ref <=4.00)

## 2014-12-08 MED ORDER — METFORMIN HCL 500 MG PO TABS: 500.0000 mg | ORAL_TABLET | Freq: Two times a day (BID) | ORAL | Status: DC

## 2014-12-10 ENCOUNTER — Encounter: Payer: Self-pay | Admitting: Family Medicine

## 2014-12-10 ENCOUNTER — Ambulatory Visit (INDEPENDENT_AMBULATORY_CARE_PROVIDER_SITE_OTHER): Payer: PPO | Admitting: Family Medicine

## 2014-12-10 VITALS — BP 120/74 | HR 80 | Temp 98.3°F | Resp 20 | Ht 66.0 in | Wt 220.0 lb

## 2014-12-10 DIAGNOSIS — E1100 Type 2 diabetes mellitus with hyperosmolarity without nonketotic hyperglycemic-hyperosmolar coma (NKHHC): Secondary | ICD-10-CM | POA: Diagnosis not present

## 2014-12-10 DIAGNOSIS — Z23 Encounter for immunization: Secondary | ICD-10-CM

## 2014-12-10 NOTE — Progress Notes (Signed)
Subjective:    Patient ID: Patrick Luna, male    DOB: [birth year], 69 y.o.   MRN: [id number]  HPI  patient was recently found to have an elevated fasting blood sugar. Hemoglobin A1c was obtained and found to be 8. Patient is here today discuss new diagnosis of diabetes mellitus type 2. He admits that he drinks a lot of sodas and sweet tea. He is not exercising regularly. He is overweight. He is not taking an aspirin. He is due for diabetic eye exam. Appointment on 12/06/2014  Component Date Value Ref Range Status  . WBC 12/06/2014 3.7* 4.0 - 10.5 K/uL Final  . RBC 12/06/2014 4.90  4.22 - 5.81 MIL/uL Final  . Hemoglobin 12/06/2014 15.1  13.0 - 17.0 g/dL Final  . HCT 12/06/2014 43.8  39.0 - 52.0 % Final  . MCV 12/06/2014 89.4  78.0 - 100.0 fL Final  . MCH 12/06/2014 30.8  26.0 - 34.0 pg Final  . MCHC 12/06/2014 34.5  30.0 - 36.0 g/dL Final  . RDW 12/06/2014 14.4  11.5 - 15.5 % Final  . Platelets 12/06/2014 210  150 - 400 K/uL Final  . MPV 12/06/2014 10.4  8.6 - 12.4 fL Final  . Neutrophils Relative % 12/06/2014 47  43 - 77 % Final  . Neutro Abs 12/06/2014 1.7  1.7 - 7.7 K/uL Final  . Lymphocytes Relative 12/06/2014 36  12 - 46 % Final  . Lymphs Abs 12/06/2014 1.3  0.7 - 4.0 K/uL Final  . Monocytes Relative 12/06/2014 13* 3 - 12 % Final  . Monocytes Absolute 12/06/2014 0.5  0.1 - 1.0 K/uL Final  . Eosinophils Relative 12/06/2014 3  0 - 5 % Final  . Eosinophils Absolute 12/06/2014 0.1  0.0 - 0.7 K/uL Final  . Basophils Relative 12/06/2014 1  0 - 1 % Final  . Basophils Absolute 12/06/2014 0.0  0.0 - 0.1 K/uL Final  . Smear Review 12/06/2014 Criteria for review not met   Final  . Sodium 12/06/2014 140  135 - 146 mmol/L Final  . Potassium 12/06/2014 4.2  3.5 - 5.3 mmol/L Final  . Chloride 12/06/2014 103  98 - 110 mmol/L Final  . CO2 12/06/2014 25  20 - 31 mmol/L Final  . Glucose, Bld 12/06/2014 124* 70 - 99 mg/dL Final  . BUN 12/06/2014 10  7 - 25 mg/dL Final  . Creat 12/06/2014  0.83  0.70 - 1.25 mg/dL Final  . Total Bilirubin 12/06/2014 0.8  0.2 - 1.2 mg/dL Final  . Alkaline Phosphatase 12/06/2014 57  40 - 115 U/L Final  . AST 12/06/2014 30  10 - 35 U/L Final  . ALT 12/06/2014 32  9 - 46 U/L Final  . Total Protein 12/06/2014 6.6  6.1 - 8.1 g/dL Final  . Albumin 12/06/2014 4.0  3.6 - 5.1 g/dL Final  . Calcium 12/06/2014 9.4  8.6 - 10.3 mg/dL Final  . Hgb A1c MFr Bld 12/06/2014 8.0* <5.7 % Final   Comment:                                                                        According to the ADA Clinical Practice Recommendations for 2011, when HbA1c is used as a  screening test:     >=6.5%   Diagnostic of Diabetes Mellitus            (if abnormal result is confirmed)   5.7-6.4%   Increased risk of developing Diabetes Mellitus   References:Diagnosis and Classification of Diabetes Mellitus,Diabetes INOM,7672,09(OBSJG 1):S62-S69 and Standards of Medical Care in         Diabetes - 2011,Diabetes Care,2011,34 (Suppl 1):S11-S61.     . Mean Plasma Glucose 12/06/2014 183* <117 mg/dL Final  . Cholesterol 12/06/2014 102* 125 - 200 mg/dL Final  . Triglycerides 12/06/2014 48  <150 mg/dL Final  . HDL 12/06/2014 42  >=40 mg/dL Final  . Total CHOL/HDL Ratio 12/06/2014 2.4  <=5.0 Ratio Final  . VLDL 12/06/2014 10  <30 mg/dL Final  . LDL Cholesterol 12/06/2014 50  <130 mg/dL Final   Comment:   Total Cholesterol/HDL Ratio:CHD Risk                        Coronary Heart Disease Risk Table                                        Men       Women          1/2 Average Risk              3.4        3.3              Average Risk              5.0        4.4           2X Average Risk              9.6        7.1           3X Average Risk             23.4       11.0 Use the calculated Patient Ratio above and the CHD Risk table  to determine the patient's CHD Risk.   Marland Kitchen PSA 12/06/2014 0.64  <=4.00 ng/mL Final   Comment: Test Methodology: ECLIA PSA (Electrochemiluminescence  Immunoassay)   For PSA values from 2.5-4.0, particularly in younger men <46 years old, the AUA and NCCN suggest testing for % Free PSA (3515) and evaluation of the rate of increase in PSA (PSA velocity).   Office Visit on 11/19/2014  Component Date Value Ref Range Status  . Color, Urine 11/19/2014 YELLOW  YELLOW Final   Comment: ** Please note change in unit of measure and reference range(s). **     . APPearance 11/19/2014 CLEAR  CLEAR Final  . Specific Gravity, Urine 11/19/2014 1.025  1.001 - 1.035 Final  . pH 11/19/2014 5.5  5.0 - 8.0 Final  . Glucose, UA 11/19/2014 2+* NEGATIVE Final  . Bilirubin Urine 11/19/2014 NEGATIVE  NEGATIVE Final  . Ketones, ur 11/19/2014 NEGATIVE  NEGATIVE Final  . Hgb urine dipstick 11/19/2014 TRACE* NEGATIVE Final  . Protein, ur 11/19/2014 NEGATIVE  NEGATIVE Final  . Nitrite 11/19/2014 NEGATIVE  NEGATIVE Final  . Leukocytes, UA 11/19/2014 NEGATIVE  NEGATIVE Final  . Colony Count 11/19/2014 NO GROWTH   Final  . Organism ID, Bacteria 11/19/2014 NO GROWTH   Final  . WBC, UA 11/19/2014 NONE SEEN  <=5 WBC/HPF  Final  . RBC / HPF 11/19/2014 0-2  <=2 RBC/HPF Final  . Squamous Epithelial / LPF 11/19/2014 0-5  <=5 HPF Final  . Bacteria, UA 11/19/2014 FEW* NONE SEEN HPF Final  . Crystals 11/19/2014 NONE SEEN  NONE SEEN HPF Final  . Casts 11/19/2014 NONE SEEN  NONE SEEN LPF Final  . Yeast 11/19/2014 NONE SEEN  NONE SEEN HPF Final   Past Medical History  Diagnosis Date  . Hypercholesterolemia   . Chronic sinusitis   . Hepatic steatosis   . GERD (gastroesophageal reflux disease)   . Jackhammer esophagus     [facility], botox, dilation, isosorbide   Past Surgical History  Procedure Laterality Date  . Colonoscopy  2007  . Cystectomy      BENIGN CYST REMOVAL FROM RIGHT SHOULDER  . Esophageal manometry N/A 02/02/2013    Procedure: ESOPHAGEAL MANOMETRY (EM);  Surgeon: Cleotis Nipper, MD;  Location: [facility];  Service: Endoscopy;  Laterality: N/A;  . Hand  debridement  2009    left  . Excision haglund's deformity with achilles tendon repair Right 07/23/2013    Procedure: RIGHT FOOT: EXCISION PARTIAL BONE TALUS/CALCANEUS, REPAIR RUPTURE  ACHILLES TENDON PRIMARY OPEN/PERCUTANEOUS;  Surgeon: Ninetta Lights, MD;  Location: [place];  Service: Orthopedics;  Laterality: Right;   Current Outpatient Prescriptions on File Prior to Visit  Medication Sig Dispense Refill  . metFORMIN (GLUCOPHAGE) 500 MG tablet Take 1 tablet (500 mg total) by mouth 2 (two) times daily with a meal. 2 tablet 0  . oxyCODONE-acetaminophen (ROXICET) 5-325 MG per tablet Take 1-2 tablets by mouth every 4 (four) hours as needed. 60 tablet 0   No current facility-administered medications on file prior to visit.   Allergies  Allergen Reactions  . Ampicillin Rash   Social History   Social History  . Marital Status: Married    Spouse Name: N/A  . Number of Children: N/A  . Years of Education: N/A   Occupational History  . Not on file.   Social History Main Topics  . Smoking status: Former Smoker    Types: Cigarettes    Quit date: 11/27/1968  . Smokeless tobacco: Never Used     Comment: QUIT 1970  . Alcohol Use: No  . Drug Use: No  . Sexual Activity: Not on file   Other Topics Concern  . Not on file   Social History Narrative      Review of Systems  All other systems reviewed and are negative.      Objective:   Physical Exam  Constitutional: He appears well-developed and well-nourished.  Cardiovascular: Normal rate, regular rhythm and normal heart sounds.   Pulmonary/Chest: Effort normal and breath sounds normal.  Abdominal: Soft. Bowel sounds are normal.  Vitals reviewed.         Assessment & Plan:  Uncontrolled type 2 diabetes mellitus with hyperosmolarity without coma, without long-term current use of insulin (HCC)   Almost 25 minutes was spent in discussion regarding his diet a new diagnosis. We discussed a low carbohydrate  diet. I recommended less than 45 g of carbohydrates per meal. Gradually increase metformin to thousand milligrams by mouth twice a day. Add aspirin 81 mg by mouth daily. Begin aerobic exercise with an ultimate goal 30 minutes a day 5 days a week. Recheck fasting lab work in 3 months. Patient received his flu shot today as well as his pneumonia vaccine. We will discuss a diabetic foot exam, diabetic eye exam and urine microalbumin  at his next visit.

## 2014-12-10 NOTE — Addendum Note (Signed)
Addended by: Shary Decamp B on: 12/10/2014 04:11 PM   Modules accepted: Orders

## 2015-01-03 DIAGNOSIS — S62617D Displaced fracture of proximal phalanx of left little finger, subsequent encounter for fracture with routine healing: Secondary | ICD-10-CM | POA: Insufficient documentation

## 2015-01-04 ENCOUNTER — Other Ambulatory Visit: Payer: Self-pay | Admitting: Family Medicine

## 2015-01-04 NOTE — Telephone Encounter (Signed)
Medication refilled per protocol. 

## 2015-01-13 ENCOUNTER — Telehealth: Payer: Self-pay | Admitting: Family Medicine

## 2015-01-13 MED ORDER — GLUCOSE BLOOD VI STRP: ORAL_STRIP | Status: DC

## 2015-01-13 NOTE — Telephone Encounter (Signed)
Pt came by to request that we write him for a prescription of the Freestyle Lite test strips [facility] [address]. Pt's # [phone]

## 2015-01-13 NOTE — Telephone Encounter (Signed)
Medication called/sent to requested pharmacy  

## 2015-02-17 ENCOUNTER — Other Ambulatory Visit: Payer: Self-pay | Admitting: Gastroenterology

## 2015-02-17 DIAGNOSIS — Z86010 Personal history of colonic polyps: Secondary | ICD-10-CM | POA: Diagnosis not present

## 2015-02-17 DIAGNOSIS — K621 Rectal polyp: Secondary | ICD-10-CM | POA: Diagnosis not present

## 2015-02-23 DIAGNOSIS — Z9889 Other specified postprocedural states: Secondary | ICD-10-CM | POA: Diagnosis not present

## 2015-02-23 DIAGNOSIS — R079 Chest pain, unspecified: Secondary | ICD-10-CM | POA: Diagnosis not present

## 2015-02-23 DIAGNOSIS — Z7982 Long term (current) use of aspirin: Secondary | ICD-10-CM | POA: Diagnosis not present

## 2015-02-23 DIAGNOSIS — K224 Dyskinesia of esophagus: Secondary | ICD-10-CM | POA: Diagnosis not present

## 2015-02-23 DIAGNOSIS — Z79899 Other long term (current) drug therapy: Secondary | ICD-10-CM | POA: Diagnosis not present

## 2015-03-31 ENCOUNTER — Ambulatory Visit (INDEPENDENT_AMBULATORY_CARE_PROVIDER_SITE_OTHER): Payer: PPO | Admitting: Family Medicine

## 2015-03-31 ENCOUNTER — Encounter: Payer: Self-pay | Admitting: Family Medicine

## 2015-03-31 VITALS — BP 110/70 | HR 100 | Temp 98.3°F | Resp 16 | Wt 211.0 lb

## 2015-03-31 DIAGNOSIS — E1100 Type 2 diabetes mellitus with hyperosmolarity without nonketotic hyperglycemic-hyperosmolar coma (NKHHC): Secondary | ICD-10-CM

## 2015-03-31 NOTE — Progress Notes (Signed)
Subjective:    Patient ID: Patrick Luna, male    DOB: December 03, 1945, 70 y.o.   MRN: [id number]  HPI 12/10/14 Patient was recently found to have an elevated fasting blood sugar. Hemoglobin A1c was obtained and found to be 8. Patient is here today discuss new diagnosis of diabetes mellitus type 2. He admits that he drinks a lot of sodas and sweet tea. He is not exercising regularly. He is overweight. He is not taking an aspirin. He is due for diabetic eye exam.  At that time, my plan was:  Almost 25 minutes was spent in discussion regarding his diet a new diagnosis. We discussed a low carbohydrate diet. I recommended less than 45 g of carbohydrates per meal. Gradually increase metformin to thousand milligrams by mouth twice a day. Add aspirin 81 mg by mouth daily. Begin aerobic exercise with an ultimate goal 30 minutes a day 5 days a week. Recheck fasting lab work in 3 months. Patient received his flu shot today as well as his pneumonia vaccine. We will discuss a diabetic foot exam, diabetic eye exam and urine microalbumin at his next visit.  03/31/15 Here for follow up.  He has lost 9 lbs since his last office visit.    Pneumovax 23 is up to date along with flu shot.   His fasting blood sugars are between 79 and 93. He denies any hypoglycemia. He is not checking 2 hour postprandial sugars. He is working very hard on his diet and exercise. He denies any side effects from metformin. He is due for a diabetic eye exam  Past Medical History  Diagnosis Date  . Hypercholesterolemia   . Chronic sinusitis   . Hepatic steatosis   . GERD (gastroesophageal reflux disease)   . Jackhammer esophagus     [facility], botox, dilation, isosorbide   Past Surgical History  Procedure Laterality Date  . Colonoscopy  2007  . Cystectomy      BENIGN CYST REMOVAL FROM RIGHT SHOULDER  . Esophageal manometry N/A 02/02/2013    Procedure: ESOPHAGEAL MANOMETRY (EM);  Surgeon: Cleotis Nipper, MD;  Location: [facility];   Service: Endoscopy;  Laterality: N/A;  . Hand debridement  2009    left  . Excision haglund's deformity with achilles tendon repair Right 07/23/2013    Procedure: RIGHT FOOT: EXCISION PARTIAL BONE TALUS/CALCANEUS, REPAIR RUPTURE  ACHILLES TENDON PRIMARY OPEN/PERCUTANEOUS;  Surgeon: Ninetta Lights, MD;  Location: [place];  Service: Orthopedics;  Laterality: Right;   Current Outpatient Prescriptions on File Prior to Visit  Medication Sig Dispense Refill  . atorvastatin (LIPITOR) 20 MG tablet Take 20 mg by mouth daily.    Marland Kitchen glucose blood test strip Use as instructed (ICD-10 - E11.9) 100 each 12  . isosorbide mononitrate (IMDUR) 60 MG 24 hr tablet Take 60 mg by mouth daily.    . Melatonin 10 MG CAPS Take by mouth.    . metFORMIN (GLUCOPHAGE) 500 MG tablet Take 1 tablet (500 mg total) by mouth 2 (two) times daily with a meal. 180 tablet 0  . nitroGLYCERIN (NITROSTAT) 0.4 MG SL tablet Place 0.4 mg under the tongue every 5 (five) minutes as needed for chest pain.    Marland Kitchen oxyCODONE-acetaminophen (ROXICET) 5-325 MG per tablet Take 1-2 tablets by mouth every 4 (four) hours as needed. 60 tablet 0  . UNABLE TO FIND GI Cocktail - carafate, lidocaine and antacid  - 1 tblspn prn    . venlafaxine (EFFEXOR) 75 MG tablet  Take 75 mg by mouth daily.     No current facility-administered medications on file prior to visit.   Allergies  Allergen Reactions  . Ampicillin Rash   Social History   Social History  . Marital Status: Married    Spouse Name: N/A  . Number of Children: N/A  . Years of Education: N/A   Occupational History  . Not on file.   Social History Main Topics  . Smoking status: Former Smoker    Types: Cigarettes    Quit date: 11/27/1968  . Smokeless tobacco: Never Used     Comment: QUIT 1970  . Alcohol Use: No  . Drug Use: No  . Sexual Activity: Not on file   Other Topics Concern  . Not on file   Social History Narrative      Review of Systems  All other  systems reviewed and are negative.      Objective:   Physical Exam  Constitutional: He appears well-developed and well-nourished.  Cardiovascular: Normal rate, regular rhythm and normal heart sounds.   Pulmonary/Chest: Effort normal and breath sounds normal.  Abdominal: Soft. Bowel sounds are normal.  Vitals reviewed.         Assessment & Plan:  Uncontrolled type 2 diabetes mellitus with hyperosmolarity without coma, without long-term current use of insulin ([place]) - Plan: CBC with Differential/Platelet, COMPLETE METABOLIC PANEL WITH GFR, Lipid panel, Microalbumin, urine, Hemoglobin A1c, Ambulatory referral to Ophthalmology   blood pressures well controlled. Sugars appear controlled. I will have the patient return fasting for an A1c, urine microalbumin, and lipid panel. Goal LDL cholesterol is less than 100. Goal hemoglobin A1c is less than 6.5. If he does have elevated microalbumin in his urine, I will start the patient on an angiotensin receptor blocker. I have recommended an eye exam.

## 2015-04-01 ENCOUNTER — Other Ambulatory Visit: Payer: PPO

## 2015-04-01 DIAGNOSIS — E1100 Type 2 diabetes mellitus with hyperosmolarity without nonketotic hyperglycemic-hyperosmolar coma (NKHHC): Secondary | ICD-10-CM | POA: Diagnosis not present

## 2015-04-01 LAB — CBC WITH DIFFERENTIAL/PLATELET
Basophils Absolute: 0 10*3/uL (ref 0.0–0.1)
Basophils Relative: 0 % (ref 0–1)
Eosinophils Absolute: 0.2 10*3/uL (ref 0.0–0.7)
Eosinophils Relative: 3 % (ref 0–5)
HCT: 44.2 % (ref 39.0–52.0)
HEMOGLOBIN: 14.6 g/dL (ref 13.0–17.0)
LYMPHS ABS: 1.7 10*3/uL (ref 0.7–4.0)
LYMPHS PCT: 22 % (ref 12–46)
MCH: 29.9 pg (ref 26.0–34.0)
MCHC: 33 g/dL (ref 30.0–36.0)
MCV: 90.6 fL (ref 78.0–100.0)
MONOS PCT: 11 % (ref 3–12)
MPV: 10.9 fL (ref 8.6–12.4)
Monocytes Absolute: 0.9 10*3/uL (ref 0.1–1.0)
NEUTROS ABS: 5 10*3/uL (ref 1.7–7.7)
NEUTROS PCT: 64 % (ref 43–77)
Platelets: 183 10*3/uL (ref 150–400)
RBC: 4.88 MIL/uL (ref 4.22–5.81)
RDW: 15 % (ref 11.5–15.5)
WBC: 7.8 10*3/uL (ref 4.0–10.5)

## 2015-04-01 LAB — COMPLETE METABOLIC PANEL WITH GFR
ALT: 22 U/L (ref 9–46)
AST: 19 U/L (ref 10–35)
Albumin: 4 g/dL (ref 3.6–5.1)
Alkaline Phosphatase: 54 U/L (ref 40–115)
BILIRUBIN TOTAL: 1 mg/dL (ref 0.2–1.2)
BUN: 14 mg/dL (ref 7–25)
CHLORIDE: 101 mmol/L (ref 98–110)
CO2: 30 mmol/L (ref 20–31)
CREATININE: 0.8 mg/dL (ref 0.70–1.25)
Calcium: 9.1 mg/dL (ref 8.6–10.3)
GLUCOSE: 102 mg/dL — AB (ref 70–99)
Potassium: 4.4 mmol/L (ref 3.5–5.3)
Sodium: 139 mmol/L (ref 135–146)
TOTAL PROTEIN: 6.6 g/dL (ref 6.1–8.1)

## 2015-04-01 LAB — LIPID PANEL
Cholesterol: 114 mg/dL — ABNORMAL LOW (ref 125–200)
HDL: 45 mg/dL (ref >=40)
LDL CALC: 55 mg/dL (ref <130)
TRIGLYCERIDES: 72 mg/dL (ref <150)
Total CHOL/HDL Ratio: 2.5 Ratio (ref <=5.0)
VLDL: 14 mg/dL (ref <30)

## 2015-04-01 LAB — HEMOGLOBIN A1C
HEMOGLOBIN A1C: 6.1 % — AB (ref <5.7)
Mean Plasma Glucose: 128 mg/dL — ABNORMAL HIGH (ref <117)

## 2015-04-02 LAB — MICROALBUMIN, URINE: Microalb, Ur: 0.8 mg/dL

## 2015-04-06 ENCOUNTER — Other Ambulatory Visit: Payer: Self-pay | Admitting: Family Medicine

## 2015-04-14 DIAGNOSIS — D1801 Hemangioma of skin and subcutaneous tissue: Secondary | ICD-10-CM | POA: Diagnosis not present

## 2015-04-14 DIAGNOSIS — Z85828 Personal history of other malignant neoplasm of skin: Secondary | ICD-10-CM | POA: Diagnosis not present

## 2015-04-14 DIAGNOSIS — L821 Other seborrheic keratosis: Secondary | ICD-10-CM | POA: Diagnosis not present

## 2015-04-14 DIAGNOSIS — L570 Actinic keratosis: Secondary | ICD-10-CM | POA: Diagnosis not present

## 2015-04-14 DIAGNOSIS — L918 Other hypertrophic disorders of the skin: Secondary | ICD-10-CM | POA: Diagnosis not present

## 2015-04-14 DIAGNOSIS — D235 Other benign neoplasm of skin of trunk: Secondary | ICD-10-CM | POA: Diagnosis not present

## 2015-09-30 ENCOUNTER — Other Ambulatory Visit: Payer: Self-pay | Admitting: Family Medicine

## 2015-09-30 NOTE — Telephone Encounter (Signed)
Refill appropriate and filled per protocol. 

## 2015-10-08 ENCOUNTER — Other Ambulatory Visit: Payer: Self-pay | Admitting: Family Medicine

## 2016-01-13 ENCOUNTER — Other Ambulatory Visit: Payer: Self-pay | Admitting: Family Medicine

## 2016-03-13 DIAGNOSIS — H2513 Age-related nuclear cataract, bilateral: Secondary | ICD-10-CM | POA: Diagnosis not present

## 2016-03-13 DIAGNOSIS — H10413 Chronic giant papillary conjunctivitis, bilateral: Secondary | ICD-10-CM | POA: Diagnosis not present

## 2016-03-13 DIAGNOSIS — H11123 Conjunctival concretions, bilateral: Secondary | ICD-10-CM | POA: Diagnosis not present

## 2016-03-22 DIAGNOSIS — R002 Palpitations: Secondary | ICD-10-CM | POA: Diagnosis not present

## 2016-03-22 DIAGNOSIS — I2510 Atherosclerotic heart disease of native coronary artery without angina pectoris: Secondary | ICD-10-CM | POA: Diagnosis not present

## 2016-03-22 DIAGNOSIS — I10 Essential (primary) hypertension: Secondary | ICD-10-CM | POA: Diagnosis not present

## 2016-03-22 DIAGNOSIS — E7800 Pure hypercholesterolemia, unspecified: Secondary | ICD-10-CM | POA: Diagnosis not present

## 2016-03-27 ENCOUNTER — Other Ambulatory Visit: Payer: Self-pay | Admitting: Family Medicine

## 2016-03-30 DIAGNOSIS — N401 Enlarged prostate with lower urinary tract symptoms: Secondary | ICD-10-CM | POA: Diagnosis not present

## 2016-03-30 DIAGNOSIS — N430 Encysted hydrocele: Secondary | ICD-10-CM | POA: Diagnosis not present

## 2016-03-30 DIAGNOSIS — R350 Frequency of micturition: Secondary | ICD-10-CM | POA: Diagnosis not present

## 2016-03-30 DIAGNOSIS — N200 Calculus of kidney: Secondary | ICD-10-CM | POA: Diagnosis not present

## 2016-04-02 DIAGNOSIS — I2510 Atherosclerotic heart disease of native coronary artery without angina pectoris: Secondary | ICD-10-CM | POA: Diagnosis not present

## 2016-04-02 DIAGNOSIS — E7800 Pure hypercholesterolemia, unspecified: Secondary | ICD-10-CM | POA: Diagnosis not present

## 2016-04-02 DIAGNOSIS — R7303 Prediabetes: Secondary | ICD-10-CM | POA: Diagnosis not present

## 2016-04-16 DIAGNOSIS — L821 Other seborrheic keratosis: Secondary | ICD-10-CM | POA: Diagnosis not present

## 2016-04-16 DIAGNOSIS — L570 Actinic keratosis: Secondary | ICD-10-CM | POA: Diagnosis not present

## 2016-04-16 DIAGNOSIS — D235 Other benign neoplasm of skin of trunk: Secondary | ICD-10-CM | POA: Diagnosis not present

## 2016-04-16 DIAGNOSIS — Z85828 Personal history of other malignant neoplasm of skin: Secondary | ICD-10-CM | POA: Diagnosis not present

## 2016-05-07 DIAGNOSIS — R002 Palpitations: Secondary | ICD-10-CM | POA: Diagnosis not present

## 2016-05-07 DIAGNOSIS — I10 Essential (primary) hypertension: Secondary | ICD-10-CM | POA: Diagnosis not present

## 2016-05-07 DIAGNOSIS — E119 Type 2 diabetes mellitus without complications: Secondary | ICD-10-CM | POA: Diagnosis not present

## 2016-05-07 DIAGNOSIS — E7800 Pure hypercholesterolemia, unspecified: Secondary | ICD-10-CM | POA: Diagnosis not present

## 2016-06-14 ENCOUNTER — Telehealth: Payer: Self-pay | Admitting: Family Medicine

## 2016-06-14 NOTE — Telephone Encounter (Signed)
Ok to refill??    - we did not put him on this medication. It is filled by [facility]. He was rx'd this on 03/22/16. LOV here was 03/31/15.

## 2016-06-14 NOTE — Telephone Encounter (Signed)
Pt needs [facility]ril [facility] [address].

## 2016-06-14 NOTE — Telephone Encounter (Signed)
Ok, but due for ov. 

## 2016-06-15 MED ORDER — LISINOPRIL 10 MG PO TABS: 10.0000 mg | ORAL_TABLET | Freq: Every day | ORAL | 0 refills | Status: DC

## 2016-06-15 NOTE — Telephone Encounter (Signed)
Medication called/sent to requested pharmacy and note to pharm pt NTBS

## 2016-06-18 ENCOUNTER — Ambulatory Visit (INDEPENDENT_AMBULATORY_CARE_PROVIDER_SITE_OTHER): Payer: PPO | Admitting: Family Medicine

## 2016-06-18 ENCOUNTER — Encounter: Payer: Self-pay | Admitting: Family Medicine

## 2016-06-18 VITALS — BP 132/80 | HR 82 | Temp 98.3°F | Resp 18 | Ht 66.0 in | Wt 216.0 lb

## 2016-06-18 DIAGNOSIS — E119 Type 2 diabetes mellitus without complications: Secondary | ICD-10-CM | POA: Diagnosis not present

## 2016-06-18 DIAGNOSIS — Z23 Encounter for immunization: Secondary | ICD-10-CM | POA: Diagnosis not present

## 2016-06-18 DIAGNOSIS — Z1159 Encounter for screening for other viral diseases: Secondary | ICD-10-CM

## 2016-06-18 DIAGNOSIS — Z13220 Encounter for screening for lipoid disorders: Secondary | ICD-10-CM

## 2016-06-18 DIAGNOSIS — I10 Essential (primary) hypertension: Secondary | ICD-10-CM | POA: Diagnosis not present

## 2016-06-19 NOTE — Progress Notes (Signed)
Subjective:    Patient ID: Patrick Luna, male    DOB: [birth year], 71 y.o.   MRN: [id number]  HPI10/28/16 Patient was recently found to have an elevated fasting blood sugar. Hemoglobin A1c was obtained and found to be 8. Patient is here today discuss new diagnosis of diabetes mellitus type 2. He admits that he drinks a lot of sodas and sweet tea. He is not exercising regularly. He is overweight. He is not taking an aspirin. He is due for diabetic eye exam.  At that time, my plan was:  Almost 25 minutes was spent in discussion regarding his diet a new diagnosis. We discussed a low carbohydrate diet. I recommended less than 45 g of carbohydrates per meal. Gradually increase metformin to thousand milligrams by mouth twice a day. Add aspirin 81 mg by mouth daily. Begin aerobic exercise with an ultimate goal 30 minutes a day 5 days a week. Recheck fasting lab work in 3 months. Patient received his flu shot today as well as his pneumonia vaccine. We will discuss a diabetic foot exam, diabetic eye exam and urine microalbumin at his next visit.  03/31/15 Here for follow up.  He has lost 9 lbs since his last office visit.    Pneumovax 23 is up to date along with flu shot.   His fasting blood sugars are between 79 and 93. He denies any hypoglycemia. He is not checking 2 hour postprandial sugars. He is working very hard on his diet and exercise. He denies any side effects from metformin. He is due for a diabetic eye exam.  At that time, my plan was:  blood pressures well controlled. Sugars appear controlled. I will have the patient return fasting for an A1c, urine microalbumin, and lipid panel. Goal LDL cholesterol is less than 100. Goal hemoglobin A1c is less than 6.5. If he does have elevated microalbumin in his urine, I will start the patient on an angiotensin receptor blocker. I have recommended an eye exam.  06/19/16 Patient has not been seen in more than a year. We asked him to come in today for recheck  of his lab work and to monitor his diabetes. He denies any polyuria, polydipsia, or blurry vision. He denies any neuropathic pain or numbness in his feet. His diabetic eye exam was performed earlier this year by an ophthalmologist. He denies any chest pain or shortness of breath or dyspnea on exertion. His blood pressure today is well controlled 132/80. He denies any myalgias or right upper quadrant pain on his statin. Pneumovax 23 is up-to-date but the patient is due for Prevnar 13. He is also due for hepatitis C screening  Past Medical History:  Diagnosis Date  . Chronic sinusitis   . GERD (gastroesophageal reflux disease)   . Hepatic steatosis   . Hypercholesterolemia   . Jackhammer esophagus    [facility], botox, dilation, isosorbide   Past Surgical History:  Procedure Laterality Date  . COLONOSCOPY  2007  . CYSTECTOMY     BENIGN CYST REMOVAL FROM RIGHT SHOULDER  . ESOPHAGEAL MANOMETRY N/A 02/02/2013   Procedure: ESOPHAGEAL MANOMETRY (EM);  Surgeon: Cleotis Nipper, MD;  Location: [facility];  Service: Endoscopy;  Laterality: N/A;  . 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 07/23/2013   Procedure: RIGHT FOOT: EXCISION PARTIAL BONE TALUS/CALCANEUS, REPAIR RUPTURE  ACHILLES TENDON PRIMARY OPEN/PERCUTANEOUS;  Surgeon: Ninetta Lights, MD;  Location: [place];  Service: Orthopedics;  Laterality: Right;  . HAND DEBRIDEMENT  2009   left   Current Outpatient Prescriptions on File Prior to Visit  Medication Sig Dispense Refill  . atorvastatin (LIPITOR) 20 MG tablet Take 20 mg by mouth daily.    Marland Kitchen FREESTYLE LITE test strip TEST ONCE DAILY 50 each 11  . isosorbide mononitrate (IMDUR) 60 MG 24 hr tablet Take 60 mg by mouth daily.    . Lancets (FREESTYLE) lancets USE AS DIRECTED EVERY DAY 100 each 3  . lisinopril (PRINIVIL,ZESTRIL) 10 MG tablet Take 1 tablet (10 mg total) by mouth daily. 30 tablet 0  . Melatonin 10 MG CAPS Take by mouth.    . metFORMIN  (GLUCOPHAGE) 500 MG tablet TAKE 1 TABLET BY MOUTH TWICE A DAY WITH A MEAL 180 tablet 1  . nitroGLYCERIN (NITROSTAT) 0.4 MG SL tablet Place 0.4 mg under the tongue every 5 (five) minutes as needed for chest pain.    Marland Kitchen UNABLE TO FIND GI Cocktail - carafate, lidocaine and antacid  - 1 tblspn prn    . venlafaxine (EFFEXOR) 75 MG tablet Take 75 mg by mouth daily.     No current facility-administered medications on file prior to visit.    Allergies  Allergen Reactions  . Ampicillin Rash   Social History   Social History  . Marital status: Married    Spouse name: N/A  . Number of children: N/A  . Years of education: N/A   Occupational History  . Not on file.   Social History Main Topics  . Smoking status: Former Smoker    Types: Cigarettes    Quit date: 11/27/1968  . Smokeless tobacco: Never Used     Comment: QUIT 1970  . Alcohol use No  . Drug use: No  . Sexual activity: Not on file   Other Topics Concern  . Not on file   Social History Narrative  . No narrative on file      Review of Systems  All other systems reviewed and are negative.      Objective:   Physical Exam  Constitutional: He is oriented to person, place, and time. He appears well-developed and well-nourished. No distress.  Neck: Neck supple. No JVD present. No thyromegaly present.  Cardiovascular: Normal rate, regular rhythm and normal heart sounds.   Pulmonary/Chest: Effort normal and breath sounds normal. No respiratory distress. He has no wheezes. He has no rales.  Abdominal: Soft. Bowel sounds are normal. He exhibits no distension. There is no tenderness. There is no rebound and no guarding.  Musculoskeletal: He exhibits no edema.  Neurological: He is alert and oriented to person, place, and time. No cranial nerve deficit. Coordination normal.  Skin: He is not diaphoretic.  Vitals reviewed.         Assessment & Plan:  Need for prophylactic vaccination against Streptococcus pneumoniae  (pneumococcus) - Plan: Pneumococcal conjugate vaccine 13-valent IM  Screening cholesterol level  Benign essential HTN  Controlled type 2 diabetes mellitus without complication, without long-term current use of insulin ([place]) - Plan: CBC with Differential/Platelet, COMPLETE METABOLIC PANEL WITH GFR, Lipid panel, Hemoglobin A1c, Microalbumin, urine  The patient's blood pressure today is well controlled. I'll make no changes in medication. I will have the patient return tomorrow fasting so that I can check an A1c. Goal hemoglobin A1c is less than 6.5. Also check a fasting lipid panel. Goal LDL cholesterol is less than 100. I will also check a urine microalbumin to rule out diabetic nephropathy. Diabetic foot exam and diabetic eye exam are up-to-date.  Patient received Pneumovax 23 last year. He received Prevnar 13 this year. I will also screen the patient for hepatitis C.

## 2016-06-20 ENCOUNTER — Other Ambulatory Visit: Payer: PPO

## 2016-06-21 ENCOUNTER — Other Ambulatory Visit: Payer: PPO

## 2016-06-21 DIAGNOSIS — Z1159 Encounter for screening for other viral diseases: Secondary | ICD-10-CM | POA: Diagnosis not present

## 2016-06-21 DIAGNOSIS — E119 Type 2 diabetes mellitus without complications: Secondary | ICD-10-CM | POA: Diagnosis not present

## 2016-06-21 LAB — CBC WITH DIFFERENTIAL/PLATELET
BASOS PCT: 1 %
Basophils Absolute: 55 cells/uL (ref 0–200)
EOS ABS: 220 {cells}/uL (ref 15–500)
Eosinophils Relative: 4 %
HCT: 46 % (ref 38.5–50.0)
Hemoglobin: 15.1 g/dL (ref 13.0–17.0)
Lymphocytes Relative: 27 %
Lymphs Abs: 1485 cells/uL (ref 850–3900)
MCH: 30.4 pg (ref 27.0–33.0)
MCHC: 32.8 g/dL (ref 32.0–36.0)
MCV: 92.6 fL (ref 80.0–100.0)
MONOS PCT: 8 %
MPV: 10.6 fL (ref 7.5–12.5)
Monocytes Absolute: 440 cells/uL (ref 200–950)
NEUTROS ABS: 3300 {cells}/uL (ref 1500–7800)
Neutrophils Relative %: 60 %
PLATELETS: 213 10*3/uL (ref 140–400)
RBC: 4.97 MIL/uL (ref 4.20–5.80)
RDW: 14.1 % (ref 11.0–15.0)
WBC: 5.5 10*3/uL (ref 3.8–10.8)

## 2016-06-21 LAB — COMPLETE METABOLIC PANEL WITH GFR
ALT: 24 U/L (ref 9–46)
AST: 21 U/L (ref 10–35)
Albumin: 4.1 g/dL (ref 3.6–5.1)
Alkaline Phosphatase: 48 U/L (ref 40–115)
BILIRUBIN TOTAL: 1 mg/dL (ref 0.2–1.2)
BUN: 15 mg/dL (ref 7–25)
CO2: 25 mmol/L (ref 20–31)
CREATININE: 0.89 mg/dL (ref 0.70–1.25)
Calcium: 9.4 mg/dL (ref 8.6–10.3)
Chloride: 103 mmol/L (ref 98–110)
GFR, Est Non African American: 87 mL/min (ref >=60)
Glucose, Bld: 126 mg/dL — ABNORMAL HIGH (ref 70–99)
Potassium: 4.3 mmol/L (ref 3.5–5.3)
Sodium: 139 mmol/L (ref 135–146)
TOTAL PROTEIN: 6.9 g/dL (ref 6.1–8.1)

## 2016-06-21 LAB — LIPID PANEL
Cholesterol: 111 mg/dL (ref <200)
HDL: 44 mg/dL (ref >40)
LDL CALC: 48 mg/dL (ref <100)
TRIGLYCERIDES: 96 mg/dL (ref <150)
Total CHOL/HDL Ratio: 2.5 Ratio (ref <5.0)
VLDL: 19 mg/dL (ref <30)

## 2016-06-22 LAB — HEMOGLOBIN A1C
HEMOGLOBIN A1C: 6.5 % — AB (ref <5.7)
Mean Plasma Glucose: 140 mg/dL

## 2016-06-22 LAB — HEPATITIS C ANTIBODY: HCV Ab: NEGATIVE

## 2016-06-22 LAB — MICROALBUMIN, URINE: Microalb, Ur: 0.8 mg/dL

## 2016-06-27 DIAGNOSIS — L821 Other seborrheic keratosis: Secondary | ICD-10-CM | POA: Diagnosis not present

## 2016-06-27 DIAGNOSIS — L570 Actinic keratosis: Secondary | ICD-10-CM | POA: Diagnosis not present

## 2016-06-27 DIAGNOSIS — L814 Other melanin hyperpigmentation: Secondary | ICD-10-CM | POA: Diagnosis not present

## 2016-06-27 DIAGNOSIS — Z85828 Personal history of other malignant neoplasm of skin: Secondary | ICD-10-CM | POA: Diagnosis not present

## 2016-09-12 DIAGNOSIS — E119 Type 2 diabetes mellitus without complications: Secondary | ICD-10-CM | POA: Diagnosis not present

## 2016-09-12 DIAGNOSIS — H2513 Age-related nuclear cataract, bilateral: Secondary | ICD-10-CM | POA: Diagnosis not present

## 2016-09-22 ENCOUNTER — Other Ambulatory Visit: Payer: Self-pay | Admitting: Family Medicine

## 2016-09-24 NOTE — Telephone Encounter (Signed)
Medication refilled per protocol. 

## 2016-09-26 ENCOUNTER — Other Ambulatory Visit: Payer: Self-pay | Admitting: Family Medicine

## 2016-09-26 MED ORDER — LISINOPRIL 10 MG PO TABS: 10.0000 mg | ORAL_TABLET | Freq: Every day | ORAL | 1 refills | Status: DC

## 2016-09-26 NOTE — Telephone Encounter (Signed)
Patient requesting a refill on his  lisinopril (PRINIVIL,ZESTRIL) 10 MG [facility] [address] He only has one tablet left. CB# [phone]

## 2016-09-26 NOTE — Telephone Encounter (Signed)
Rx sent to pharmacy pt does not have a VM

## 2016-10-22 DIAGNOSIS — H2512 Age-related nuclear cataract, left eye: Secondary | ICD-10-CM | POA: Diagnosis not present

## 2016-10-24 DIAGNOSIS — H2512 Age-related nuclear cataract, left eye: Secondary | ICD-10-CM | POA: Diagnosis not present

## 2016-11-12 ENCOUNTER — Other Ambulatory Visit: Payer: Self-pay | Admitting: Family Medicine

## 2016-11-25 ENCOUNTER — Other Ambulatory Visit: Payer: Self-pay | Admitting: Family Medicine

## 2017-02-20 ENCOUNTER — Other Ambulatory Visit: Payer: Self-pay | Admitting: Family Medicine

## 2017-02-20 MED ORDER — LISINOPRIL 10 MG PO TABS: 10.0000 mg | ORAL_TABLET | Freq: Every day | ORAL | 3 refills | Status: DC

## 2017-03-20 ENCOUNTER — Other Ambulatory Visit: Payer: Self-pay | Admitting: Family Medicine

## 2017-04-02 DIAGNOSIS — N401 Enlarged prostate with lower urinary tract symptoms: Secondary | ICD-10-CM | POA: Diagnosis not present

## 2017-04-02 DIAGNOSIS — N430 Encysted hydrocele: Secondary | ICD-10-CM | POA: Diagnosis not present

## 2017-04-02 DIAGNOSIS — R350 Frequency of micturition: Secondary | ICD-10-CM | POA: Diagnosis not present

## 2017-04-17 DIAGNOSIS — L821 Other seborrheic keratosis: Secondary | ICD-10-CM | POA: Diagnosis not present

## 2017-04-17 DIAGNOSIS — L570 Actinic keratosis: Secondary | ICD-10-CM | POA: Diagnosis not present

## 2017-04-17 DIAGNOSIS — Z85828 Personal history of other malignant neoplasm of skin: Secondary | ICD-10-CM | POA: Diagnosis not present

## 2017-05-24 ENCOUNTER — Telehealth: Payer: Self-pay | Admitting: Family Medicine

## 2017-05-24 MED ORDER — ATORVASTATIN CALCIUM 20 MG PO TABS: 20.0000 mg | ORAL_TABLET | Freq: Every day | ORAL | 0 refills | Status: DC

## 2017-05-24 NOTE — Telephone Encounter (Signed)
Patient is calling to get refill on his atorvastatin  Originally filled by dr Glee Arvin [address]  [phone]

## 2017-05-24 NOTE — Telephone Encounter (Signed)
Medication called/sent to requested pharmacy  

## 2017-08-27 ENCOUNTER — Other Ambulatory Visit: Payer: Self-pay | Admitting: Family Medicine

## 2017-09-06 ENCOUNTER — Ambulatory Visit (INDEPENDENT_AMBULATORY_CARE_PROVIDER_SITE_OTHER): Payer: PPO | Admitting: Family Medicine

## 2017-09-06 VITALS — BP 120/68 | HR 85 | Temp 98.4°F | Resp 16 | Wt 213.2 lb

## 2017-09-06 DIAGNOSIS — E119 Type 2 diabetes mellitus without complications: Secondary | ICD-10-CM | POA: Diagnosis not present

## 2017-09-06 DIAGNOSIS — Z6834 Body mass index (BMI) 34.0-34.9, adult: Secondary | ICD-10-CM

## 2017-09-06 DIAGNOSIS — E66811 Obesity, class 1: Secondary | ICD-10-CM

## 2017-09-06 DIAGNOSIS — I10 Essential (primary) hypertension: Secondary | ICD-10-CM | POA: Diagnosis not present

## 2017-09-06 DIAGNOSIS — E7800 Pure hypercholesterolemia, unspecified: Secondary | ICD-10-CM

## 2017-09-06 DIAGNOSIS — Z0000 Encounter for general adult medical examination without abnormal findings: Secondary | ICD-10-CM | POA: Diagnosis not present

## 2017-09-06 DIAGNOSIS — E669 Obesity, unspecified: Secondary | ICD-10-CM | POA: Diagnosis not present

## 2017-09-06 NOTE — Progress Notes (Signed)
Subjective:   Patrick Luna is a 72 y.o. male who presents for Medicare Annual/Subsequent preventive examination.  Review of Systems:  Review of Systems  Constitutional: Negative.   HENT: Negative.   Eyes: Negative.   Respiratory: Negative.   Cardiovascular: Negative.   Gastrointestinal: Negative.   Endocrine: Negative.   Genitourinary: Negative.   Musculoskeletal: Negative.   Skin: Negative.   Allergic/Immunologic: Negative.   Neurological: Negative.   Hematological: Negative.   Psychiatric/Behavioral: Negative.   All other systems reviewed and are negative.   Cardiac Risk Factors include: dyslipidemia;male gender;hypertension;diabetes mellitus;sedentary lifestyle;obesity (BMI >30kg/m2);microalbuminuria;advanced age (>106men, >71 women)     Objective:    Vitals: BP 120/68   Pulse 85   Temp 98.4 F (36.9 C)   Resp 16   Wt 213 lb 4 oz (96.7 kg)   SpO2 96%   BMI 34.42 kg/m   Body mass index is 34.42 kg/m.  Advanced Directives 09/06/2017 07/20/2013  Does Patient Have a Medical Advance Directive? No Patient does not have advance directive;Patient would not like information  Would patient like information on creating a medical advance directive? (No Data) -    Tobacco Social History   Tobacco Use  Smoking Status Former Smoker  . Types: Cigarettes  . Last attempt to quit: 11/27/1968  . Years since quitting: 48.8  Smokeless Tobacco Never Used  Tobacco Comment   QUIT 1970     Counseling given: Not Answered Comment: QUIT 1970   Clinical Intake:  Pre-visit preparation completed: No  Pain : No/denies pain     BMI - recorded: 34.4 Nutritional Status: BMI > 30  Obese Nutritional Risks: Unintentional weight gain(thinks he gained a few lbs but only ) Diabetes: Yes CBG done?: No Did pt. bring in CBG monitor from home?: No           Past Medical History:  Diagnosis Date  . Chronic sinusitis   . GERD (gastroesophageal reflux disease)   . Hepatic  steatosis   . Hypercholesterolemia   . Jackhammer esophagus    [facility], botox, dilation, isosorbide   Past Surgical History:  Procedure Laterality Date  . COLONOSCOPY  2007  . CYSTECTOMY     BENIGN CYST REMOVAL FROM RIGHT SHOULDER  . ESOPHAGEAL MANOMETRY N/A 02/02/2013   Procedure: ESOPHAGEAL MANOMETRY (EM);  Surgeon: Cleotis Nipper, MD;  Location: [facility];  Service: Endoscopy;  Laterality: N/A;  . 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 07/23/2013   Procedure: RIGHT FOOT: EXCISION PARTIAL BONE TALUS/CALCANEUS, REPAIR RUPTURE  ACHILLES TENDON PRIMARY OPEN/PERCUTANEOUS;  Surgeon: Ninetta Lights, MD;  Location: [facility];  Service: Orthopedics;  Laterality: Right;  . HAND DEBRIDEMENT  2009   left   Family History  Problem Relation Age of Onset  . Hypertension Mother   . Hepatitis C Sister    Social History   Socioeconomic History  . Marital status: Married    Spouse name: Not on file  . Number of children: Not on file  . Years of education: Not on file  . Highest education level: Not on file  Occupational History  . Not on file  Social Needs  . Financial resource strain: Not on file  . Food insecurity:    Worry: Not on file    Inability: Not on file  . Transportation needs:    Medical: Not on file    Non-medical: Not on file  Tobacco Use  . Smoking status: Former Smoker    Types:  Cigarettes    Last attempt to quit: 11/27/1968    Years since quitting: 48.8  . Smokeless tobacco: Never Used  . Tobacco comment: QUIT 1970  Substance and Sexual Activity  . Alcohol use: No  . Drug use: No  . Sexual activity: Not on file  Lifestyle  . Physical activity:    Days per week: Not on file    Minutes per session: Not on file  . Stress: Not on file  Relationships  . Social connections:    Talks on phone: Not on file    Gets together: Not on file    Attends religious service: Not on file    Active member of club or organization: Not  on file    Attends meetings of clubs or organizations: Not on file    Relationship status: Not on file  Other Topics Concern  . Not on file  Social History Narrative  . Not on file    Outpatient Encounter Medications as of 09/06/2017  Medication Sig  . aspirin EC 81 MG tablet Take 81 mg by mouth daily.  Marland Kitchen atorvastatin (LIPITOR) 20 MG tablet TAKE 1 TABLET BY MOUTH EVERY DAY  . FREESTYLE LITE test strip TEST ONCE DAILY  . isosorbide mononitrate (IMDUR) 60 MG 24 hr tablet Take 60 mg by mouth daily.  . Lancets (FREESTYLE) lancets USE AS DIRECTED EVERY DAY  . lisinopril (PRINIVIL,ZESTRIL) 10 MG tablet Take 1 tablet (10 mg total) by mouth daily.  . metFORMIN (GLUCOPHAGE) 500 MG tablet TAKE 1 TABLET BY MOUTH TWICE A DAY WITH A MEAL  . venlafaxine (EFFEXOR) 75 MG tablet Take 75 mg by mouth daily.  . nitroGLYCERIN (NITROSTAT) 0.4 MG SL tablet Place 0.4 mg under the tongue every 5 (five) minutes as needed for chest pain.  . [DISCONTINUED] Melatonin 10 MG CAPS Take by mouth.  . [DISCONTINUED] UNABLE TO FIND GI Cocktail - carafate, lidocaine and antacid  - 1 tblspn prn   No facility-administered encounter medications on file as of 09/06/2017.     Activities of Daily Living In your present state of health, do you have any difficulty performing the following activities: 09/06/2017  Hearing? Y  Vision? N  Difficulty concentrating or making decisions? N  Walking or climbing stairs? N  Dressing or bathing? N  Doing errands, shopping? N  Preparing Food and eating ? N  Using the Toilet? N  In the past six months, have you accidently leaked urine? N  Do you have problems with loss of bowel control? N  Managing your Medications? N  Managing your Finances? N  Housekeeping or managing your Housekeeping? N  Some recent data might be hidden    Patient Care Team: Susy Frizzle, MD as PCP - General (Family Medicine) Satira Sark, MD as Consulting Physician (Cardiology)   Assessment:    This is a routine wellness examination for [employer].  Exercise Activities and Dietary recommendations Current Exercise Habits: The patient does not participate in regular exercise at present(mowing yard), Exercise limited by: Other - see comments  Goals    None      Fall Risk Fall Risk  09/06/2017 11/19/2014  Falls in the past year? No No  Risk for fall due to : History of fall(s) -   Is the patient's home free of loose throw rugs in walkways, pet beds, electrical cords, etc?   no      Grab bars in the bathroom? yes      Handrails on the  stairs?   yes      Adequate lighting?   yes  Depression Screen PHQ 2/9 Scores 09/06/2017 06/18/2016 11/19/2014  PHQ - 2 Score 0 0 0  PHQ- 9 Score - 1 0    Cognitive Function Cognitive Testing   Alert? Yes  Normal Appearance?Yes  Oriented to person? Yes  Place? Yes  Time? Yes  Recall of three objects? Yes  Can perform simple calculations? Yes  Displays appropriate judgment?Yes  Can read the correct time from a watch face?Yes          Immunization History  Administered Date(s) Administered  . Influenza,inj,Quad PF,6+ Mos 12/10/2014  . Pneumococcal Conjugate-13 06/18/2016  . Pneumococcal Polysaccharide-23 12/10/2014      Screening Tests Health Maintenance  Topic Date Due  . TETANUS/TDAP  01/08/1966  . HEMOGLOBIN A1C  12/22/2016  . FOOT EXAM  06/18/2017  . INFLUENZA VACCINE  09/12/2017  . OPHTHALMOLOGY EXAM  12/14/2017  . COLONOSCOPY  02/13/2024  . Hepatitis C Screening  Completed  . PNA vac Low Risk Adult  Completed   Cancer Screenings: Lung: Low Dose CT Chest recommended if Age 10-80 years, 30 pack-year currently smoking OR have quit w/in 15years. Patient does not qualify. Colorectal: UTD  Additional Screenings:  Hepatitis C Screening:      Plan:   Problem List Items Addressed This Visit      Endocrine   Controlled type 2 diabetes mellitus without complication, without long-term current use of insulin ([place])      Other   Hypercholesteremia (Chronic)   Relevant Orders   Lipid panel (Completed)    Other Visit Diagnoses    Encounter for Medicare annual wellness exam    -  Primary   Benign essential HTN       Relevant Orders   CBC with Differential/Platelet (Completed)   COMPLETE METABOLIC PANEL WITH GFR (Completed)   Diabetes mellitus without complication ([place])       Relevant Orders   CBC with Differential/Platelet (Completed)   COMPLETE METABOLIC PANEL WITH GFR (Completed)   Hemoglobin A1c (Completed)   Lipid panel (Completed)   Microalbumin, urine (Completed)   Class 1 obesity with body mass index (BMI) of 34.0 to 34.9 in adult, unspecified obesity type, unspecified whether serious comorbidity present       Relevant Orders   CBC with Differential/Platelet (Completed)   COMPLETE METABOLIC PANEL WITH GFR (Completed)   Hemoglobin A1c (Completed)   Lipid panel (Completed)       I have personally reviewed and noted the following in the patient's chart:   . Medical and social history . Use of alcohol, tobacco or illicit drugs  . Current medications and supplements . Functional ability and status . Nutritional status . Physical activity . Advanced directives . List of other physicians . Hospitalizations, surgeries, and ER visits in previous 12 months . Vitals . Screenings to include cognitive, depression, and falls . Referrals and appointments  In addition, I have reviewed and discussed with patient certain preventive protocols, quality metrics, and best practice recommendations. A written personalized care plan for preventive services as well as general preventive health recommendations were provided to patient.     Delsa Grana, PA-C  09/06/2017

## 2017-09-06 NOTE — Patient Instructions (Addendum)
  Mr. Klingel , Thank you for taking time to come for your Medicare Wellness Visit. I appreciate your ongoing commitment to your health goals. Please review the following plan we discussed and let me know if I can assist you in the future.   These are the goals we discussed: Goals    . Weight (lb) < 200 lb (90.7 kg)     Diet and exercise to help decrease weight       This is a list of the screening recommended for you and due dates:  Health Maintenance  Topic Date Due  . Tetanus Vaccine  01/08/1966  . Hemoglobin A1C  12/22/2016  . Complete foot exam   06/18/2017  . Flu Shot  09/12/2017  . Eye exam for diabetics  12/14/2017  . Colon Cancer Screening  02/13/2024  .  Hepatitis C: One time screening is recommended by Center for Disease Control  (CDC) for  adults born from [birth year] through 1965.   Completed  . Pneumonia vaccines  Completed    Get your eye exam done with ophthalmology  Call us in fall and return for flu vaccine  A1C today done  Foot Exam done

## 2017-09-07 LAB — CBC WITH DIFFERENTIAL/PLATELET
BASOS ABS: 27 {cells}/uL (ref 0–200)
Basophils Relative: 0.4 %
EOS PCT: 2.5 %
Eosinophils Absolute: 170 cells/uL (ref 15–500)
HEMATOCRIT: 43.2 % (ref 38.5–50.0)
HEMOGLOBIN: 14.3 g/dL (ref 13.2–17.1)
LYMPHS ABS: 1965 {cells}/uL (ref 850–3900)
MCH: 30 pg (ref 27.0–33.0)
MCHC: 33.1 g/dL (ref 32.0–36.0)
MCV: 90.8 fL (ref 80.0–100.0)
MPV: 11.9 fL (ref 7.5–12.5)
Monocytes Relative: 8.5 %
NEUTROS ABS: 4060 {cells}/uL (ref 1500–7800)
Neutrophils Relative %: 59.7 %
Platelets: 186 10*3/uL (ref 140–400)
RBC: 4.76 10*6/uL (ref 4.20–5.80)
RDW: 13.1 % (ref 11.0–15.0)
Total Lymphocyte: 28.9 %
WBC: 6.8 10*3/uL (ref 3.8–10.8)
WBCMIX: 578 {cells}/uL (ref 200–950)

## 2017-09-07 LAB — COMPLETE METABOLIC PANEL WITH GFR
AG Ratio: 1.8 (calc) (ref 1.0–2.5)
ALBUMIN MSPROF: 4.3 g/dL (ref 3.6–5.1)
ALKALINE PHOSPHATASE (APISO): 57 U/L (ref 40–115)
ALT: 24 U/L (ref 9–46)
AST: 22 U/L (ref 10–35)
BILIRUBIN TOTAL: 0.8 mg/dL (ref 0.2–1.2)
BUN: 16 mg/dL (ref 7–25)
CHLORIDE: 100 mmol/L (ref 98–110)
CO2: 29 mmol/L (ref 20–32)
Calcium: 9.5 mg/dL (ref 8.6–10.3)
Creat: 1.01 mg/dL (ref 0.70–1.18)
GFR, EST AFRICAN AMERICAN: 87 mL/min/{1.73_m2} (ref >=60)
GFR, Est Non African American: 75 mL/min/{1.73_m2} (ref >=60)
GLOBULIN: 2.4 g/dL (ref 1.9–3.7)
GLUCOSE: 128 mg/dL — AB (ref 65–99)
Potassium: 4.2 mmol/L (ref 3.5–5.3)
SODIUM: 136 mmol/L (ref 135–146)
TOTAL PROTEIN: 6.7 g/dL (ref 6.1–8.1)

## 2017-09-07 LAB — LIPID PANEL
CHOL/HDL RATIO: 2.8 (calc) (ref <5.0)
CHOLESTEROL: 113 mg/dL (ref <200)
HDL: 41 mg/dL (ref >40)
LDL CHOLESTEROL (CALC): 52 mg/dL
NON-HDL CHOLESTEROL (CALC): 72 mg/dL (ref <130)
Triglycerides: 114 mg/dL (ref <150)

## 2017-09-07 LAB — MICROALBUMIN, URINE: Microalb, Ur: 0.8 mg/dL

## 2017-09-07 LAB — HEMOGLOBIN A1C
Hgb A1c MFr Bld: 6.6 % of total Hgb — ABNORMAL HIGH (ref <5.7)
Mean Plasma Glucose: 143 (calc)
eAG (mmol/L): 7.9 (calc)

## 2017-09-09 NOTE — Progress Notes (Signed)
Please notify pt  All labs look great, except sugar/A1C is a little higher than past labs.  We can increase metformin dose or have him just work on diet.  Please advise if his preference. Repeat labs and AWV in one year.  If he wants to watch it sooner we can repeat labs in 4-6 months.

## 2017-09-11 ENCOUNTER — Encounter: Payer: Self-pay | Admitting: Family Medicine

## 2017-10-09 ENCOUNTER — Encounter: Payer: Self-pay | Admitting: Family Medicine

## 2017-10-18 ENCOUNTER — Telehealth: Payer: Self-pay | Admitting: Family Medicine

## 2017-10-18 ENCOUNTER — Other Ambulatory Visit: Payer: Self-pay | Admitting: *Deleted

## 2017-10-18 DIAGNOSIS — E7800 Pure hypercholesterolemia, unspecified: Secondary | ICD-10-CM

## 2017-10-18 DIAGNOSIS — E119 Type 2 diabetes mellitus without complications: Secondary | ICD-10-CM

## 2017-10-18 DIAGNOSIS — I10 Essential (primary) hypertension: Secondary | ICD-10-CM

## 2017-10-18 NOTE — Telephone Encounter (Signed)
Error

## 2017-10-18 NOTE — Telephone Encounter (Signed)
Call placed to patient.   States that he would prefer to work on diet and re-check labs in 6 months.   Future orders placed.

## 2017-10-18 NOTE — Telephone Encounter (Signed)
Pt came in regarding labs because he received letter saying we could not reach him I printed them and he read the instructions he is not comfortable increasing his metformin per leisa request.   Please call with any further ?'s or information.

## 2017-11-05 DIAGNOSIS — Z961 Presence of intraocular lens: Secondary | ICD-10-CM | POA: Diagnosis not present

## 2017-11-05 DIAGNOSIS — H25811 Combined forms of age-related cataract, right eye: Secondary | ICD-10-CM | POA: Diagnosis not present

## 2017-11-05 DIAGNOSIS — E119 Type 2 diabetes mellitus without complications: Secondary | ICD-10-CM | POA: Diagnosis not present

## 2017-11-05 LAB — HM DIABETES EYE EXAM

## 2017-11-19 ENCOUNTER — Encounter: Payer: Self-pay | Admitting: *Deleted

## 2017-11-22 ENCOUNTER — Other Ambulatory Visit: Payer: Self-pay | Admitting: Family Medicine

## 2017-11-23 ENCOUNTER — Other Ambulatory Visit: Payer: Self-pay | Admitting: Family Medicine

## 2017-11-25 ENCOUNTER — Encounter: Payer: Self-pay | Admitting: Family Medicine

## 2018-01-16 ENCOUNTER — Telehealth: Payer: Self-pay | Admitting: Family Medicine

## 2018-01-16 MED ORDER — FREESTYLE LANCETS MISC: 3 refills | Status: DC

## 2018-01-16 NOTE — Telephone Encounter (Signed)
Lancets sent to pharm as requested

## 2018-01-16 NOTE — Telephone Encounter (Signed)
Pt needs freestyle lancets sent over to [address].

## 2018-01-20 ENCOUNTER — Other Ambulatory Visit: Payer: Self-pay | Admitting: *Deleted

## 2018-01-20 MED ORDER — FREESTYLE LANCETS MISC: 3 refills | Status: DC

## 2018-01-24 ENCOUNTER — Other Ambulatory Visit: Payer: Self-pay | Admitting: Family Medicine

## 2018-02-17 ENCOUNTER — Other Ambulatory Visit: Payer: Self-pay | Admitting: Family Medicine

## 2018-02-18 ENCOUNTER — Other Ambulatory Visit: Payer: Self-pay | Admitting: Family Medicine

## 2018-04-18 DIAGNOSIS — N430 Encysted hydrocele: Secondary | ICD-10-CM | POA: Diagnosis not present

## 2018-04-18 DIAGNOSIS — N401 Enlarged prostate with lower urinary tract symptoms: Secondary | ICD-10-CM | POA: Diagnosis not present

## 2018-04-18 DIAGNOSIS — R351 Nocturia: Secondary | ICD-10-CM | POA: Diagnosis not present

## 2018-04-18 DIAGNOSIS — Z125 Encounter for screening for malignant neoplasm of prostate: Secondary | ICD-10-CM | POA: Diagnosis not present

## 2018-04-22 DIAGNOSIS — L570 Actinic keratosis: Secondary | ICD-10-CM | POA: Diagnosis not present

## 2018-04-22 DIAGNOSIS — Z85828 Personal history of other malignant neoplasm of skin: Secondary | ICD-10-CM | POA: Diagnosis not present

## 2018-04-22 DIAGNOSIS — L821 Other seborrheic keratosis: Secondary | ICD-10-CM | POA: Diagnosis not present

## 2018-05-19 ENCOUNTER — Other Ambulatory Visit: Payer: Self-pay | Admitting: Family Medicine

## 2018-05-19 MED ORDER — ATORVASTATIN CALCIUM 20 MG PO TABS: 20.0000 mg | ORAL_TABLET | Freq: Every day | ORAL | 1 refills | Status: DC

## 2018-07-29 ENCOUNTER — Other Ambulatory Visit: Payer: Self-pay | Admitting: Family Medicine

## 2018-11-06 DIAGNOSIS — E119 Type 2 diabetes mellitus without complications: Secondary | ICD-10-CM | POA: Diagnosis not present

## 2018-11-06 DIAGNOSIS — Z961 Presence of intraocular lens: Secondary | ICD-10-CM | POA: Diagnosis not present

## 2018-11-06 DIAGNOSIS — H25811 Combined forms of age-related cataract, right eye: Secondary | ICD-10-CM | POA: Diagnosis not present

## 2018-11-06 LAB — HM DIABETES EYE EXAM

## 2018-12-09 ENCOUNTER — Other Ambulatory Visit: Payer: Self-pay | Admitting: Family Medicine

## 2019-02-17 ENCOUNTER — Other Ambulatory Visit: Payer: Self-pay | Admitting: Family Medicine

## 2019-02-25 ENCOUNTER — Other Ambulatory Visit: Payer: Self-pay | Admitting: Family Medicine

## 2019-03-09 ENCOUNTER — Other Ambulatory Visit: Payer: Self-pay | Admitting: Family Medicine

## 2019-04-07 ENCOUNTER — Other Ambulatory Visit: Payer: Self-pay

## 2019-04-07 ENCOUNTER — Ambulatory Visit (INDEPENDENT_AMBULATORY_CARE_PROVIDER_SITE_OTHER): Payer: PPO | Admitting: Family Medicine

## 2019-04-07 ENCOUNTER — Encounter: Payer: Self-pay | Admitting: Family Medicine

## 2019-04-07 VITALS — BP 134/72 | HR 95 | Temp 96.8°F | Resp 16 | Ht 66.0 in | Wt 223.0 lb

## 2019-04-07 DIAGNOSIS — I10 Essential (primary) hypertension: Secondary | ICD-10-CM

## 2019-04-07 DIAGNOSIS — E7800 Pure hypercholesterolemia, unspecified: Secondary | ICD-10-CM | POA: Diagnosis not present

## 2019-04-07 DIAGNOSIS — E119 Type 2 diabetes mellitus without complications: Secondary | ICD-10-CM

## 2019-04-07 NOTE — Progress Notes (Signed)
Subjective:    Patient ID: Patrick Luna, male    DOB: 12/04/1945, 74 y.o.   MRN: [id number]  HPI   Patient is here today for follow-up of his diabetes.  His fasting blood sugars have been elevated recently.  He states that his fasting blood sugars are between 135 and 155.  He denies any polyuria, polydipsia, or blurry vision.  He denies any neuropathy in his feet.  He denies any numbness or tingling in his feet.  He denies any pain in his feet.  He denies any chest pain or shortness of breath or dyspnea on exertion.  He does take nitroglycerin however he takes this for his jackhammer esophagus.  He does not take it for any underlying coronary artery disease.  Diabetic foot exam was performed today and is completely normal.  Diabetic eye exam is up-to-date and has shown no neuropathy today.  He is overdue for fasting lab work.  His blood pressure today is excellent at 172. Past Medical History:  Diagnosis Date  . Chronic sinusitis   . GERD (gastroesophageal reflux disease)   . Hepatic steatosis   . Hypercholesterolemia   . Jackhammer esophagus    [facility], botox, dilation, isosorbide   Past Surgical History:  Procedure Laterality Date  . COLONOSCOPY  2007  . CYSTECTOMY     BENIGN CYST REMOVAL FROM RIGHT SHOULDER  . ESOPHAGEAL MANOMETRY N/A 02/02/2013   Procedure: ESOPHAGEAL MANOMETRY (EM);  Surgeon: Cleotis Nipper, MD;  Location: [facility];  Service: Endoscopy;  Laterality: N/A;  . 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 07/23/2013   Procedure: RIGHT FOOT: EXCISION PARTIAL BONE TALUS/CALCANEUS, REPAIR RUPTURE  ACHILLES TENDON PRIMARY OPEN/PERCUTANEOUS;  Surgeon: Ninetta Lights, MD;  Location: [facility];  Service: Orthopedics;  Laterality: Right;  . HAND DEBRIDEMENT  2009   left   Current Outpatient Medications on File Prior to Visit  Medication Sig Dispense Refill  . aspirin EC 81 MG tablet Take 81 mg by mouth daily.    Marland Kitchen atorvastatin  (LIPITOR) 20 MG tablet TAKE 1 TABLET BY MOUTH EVERY DAY 90 tablet 1  . FREESTYLE LITE test strip USE TO TEST BLOOD SUGAR ONCE DAILY 100 strip 6  . isosorbide mononitrate (IMDUR) 60 MG 24 hr tablet Take 60 mg by mouth daily.    . Lancets (FREESTYLE) lancets Check BS BID - DX: E11.9 200 each 3  . lisinopril (ZESTRIL) 10 MG tablet TAKE 1 TABLET BY MOUTH EVERY DAY 90 tablet 3  . metFORMIN (GLUCOPHAGE) 500 MG tablet TAKE 1 TABLET BY MOUTH TWICE A DAY WITH MEALS 60 tablet 5  . nitroGLYCERIN (NITROSTAT) 0.4 MG SL tablet Place 0.4 mg under the tongue every 5 (five) minutes as needed for chest pain.    Marland Kitchen venlafaxine (EFFEXOR) 75 MG tablet Take 75 mg by mouth daily.     No current facility-administered medications on file prior to visit.   Allergies  Allergen Reactions  . Ampicillin Rash   Social History   Socioeconomic History  . Marital status: Married    Spouse name: Not on file  . Number of children: Not on file  . Years of education: Not on file  . Highest education level: Not on file  Occupational History  . Not on file  Tobacco Use  . Smoking status: Former Smoker    Types: Cigarettes    Quit date: 11/27/1968    Years since quitting: 50.3  . Smokeless tobacco: Never Used  .  Tobacco comment: QUIT 1970  Substance and Sexual Activity  . Alcohol use: No  . Drug use: No  . Sexual activity: Not on file  Other Topics Concern  . Not on file  Social History Narrative  . Not on file   Social Determinants of Health   Financial Resource Strain:   . Difficulty of Paying Living Expenses: Not on file  Food Insecurity:   . Worried About Charity fundraiser in the Last Year: Not on file  . Ran Out of Food in the Last Year: Not on file  Transportation Needs:   . Lack of Transportation (Medical): Not on file  . Lack of Transportation (Non-Medical): Not on file  Physical Activity:   . Days of Exercise per Week: Not on file  . Minutes of Exercise per Session: Not on file  Stress:   .  Feeling of Stress : Not on file  Social Connections:   . Frequency of Communication with Friends and Family: Not on file  . Frequency of Social Gatherings with Friends and Family: Not on file  . Attends Religious Services: Not on file  . Active Member of Clubs or Organizations: Not on file  . Attends Archivist Meetings: Not on file  . Marital Status: Not on file  Intimate Partner Violence:   . Fear of Current or Ex-Partner: Not on file  . Emotionally Abused: Not on file  . Physically Abused: Not on file  . Sexually Abused: Not on file      Review of Systems  All other systems reviewed and are negative.      Objective:   Physical Exam  Constitutional: He appears well-developed and well-nourished.  Neck: No JVD present. No thyromegaly present.  Cardiovascular: Normal rate, regular rhythm and normal heart sounds.  No murmur heard. Pulmonary/Chest: Effort normal and breath sounds normal. No respiratory distress. He has no wheezes. He has no rales.  Abdominal: Soft. Bowel sounds are normal. He exhibits no distension. There is no abdominal tenderness. There is no rebound and no guarding.  Musculoskeletal:        General: No edema.  Vitals reviewed.         Assessment & Plan:  Controlled type 2 diabetes mellitus without complication, without long-term current use of insulin ([place]) - Plan: Hemoglobin A1c, CBC with Differential/Platelet, COMPLETE METABOLIC PANEL WITH GFR, Lipid panel, Microalbumin, urine  Hypercholesteremia - Plan: Hemoglobin A1c, CBC with Differential/Platelet, COMPLETE METABOLIC PANEL WITH GFR, Lipid panel, Microalbumin, urine  Benign essential HTN - Plan: Hemoglobin A1c, CBC with Differential/Platelet, COMPLETE METABOLIC PANEL WITH GFR, Lipid panel, Microalbumin, urine  Patient's physical exam today is normal.  I am very happy with his blood pressure.  His diabetic foot exam is normal.  I would like him to return fasting for CBC, CMP, fasting lipid  panel.  I would also like to check a urine microalbumin.  Diabetic eye exam is up-to-date.  Goal hemoglobin A1c is less than 6.5.  If greater than goal I would recommend increasing Metformin to 1000 mg twice daily.  Goal LDL cholesterol is less than 100.

## 2019-04-15 ENCOUNTER — Other Ambulatory Visit: Payer: PPO

## 2019-04-15 ENCOUNTER — Other Ambulatory Visit: Payer: Self-pay

## 2019-04-15 DIAGNOSIS — I10 Essential (primary) hypertension: Secondary | ICD-10-CM | POA: Diagnosis not present

## 2019-04-15 DIAGNOSIS — E119 Type 2 diabetes mellitus without complications: Secondary | ICD-10-CM | POA: Diagnosis not present

## 2019-04-15 DIAGNOSIS — E7800 Pure hypercholesterolemia, unspecified: Secondary | ICD-10-CM | POA: Diagnosis not present

## 2019-04-16 LAB — CBC WITH DIFFERENTIAL/PLATELET
Absolute Monocytes: 606 cells/uL (ref 200–950)
Basophils Absolute: 30 cells/uL (ref 0–200)
Basophils Relative: 0.5 %
Eosinophils Absolute: 234 cells/uL (ref 15–500)
Eosinophils Relative: 3.9 %
HCT: 43.7 % (ref 38.5–50.0)
Hemoglobin: 14.5 g/dL (ref 13.2–17.1)
Lymphs Abs: 1878 cells/uL (ref 850–3900)
MCH: 30.5 pg (ref 27.0–33.0)
MCHC: 33.2 g/dL (ref 32.0–36.0)
MCV: 91.8 fL (ref 80.0–100.0)
MPV: 12 fL (ref 7.5–12.5)
Monocytes Relative: 10.1 %
Neutro Abs: 3252 cells/uL (ref 1500–7800)
Neutrophils Relative %: 54.2 %
Platelets: 179 10*3/uL (ref 140–400)
RBC: 4.76 10*6/uL (ref 4.20–5.80)
RDW: 12.9 % (ref 11.0–15.0)
Total Lymphocyte: 31.3 %
WBC: 6 10*3/uL (ref 3.8–10.8)

## 2019-04-16 LAB — COMPLETE METABOLIC PANEL WITH GFR
AG Ratio: 1.7 (calc) (ref 1.0–2.5)
ALT: 34 U/L (ref 9–46)
AST: 25 U/L (ref 10–35)
Albumin: 4 g/dL (ref 3.6–5.1)
Alkaline phosphatase (APISO): 55 U/L (ref 35–144)
BUN: 16 mg/dL (ref 7–25)
CO2: 30 mmol/L (ref 20–32)
Calcium: 9.3 mg/dL (ref 8.6–10.3)
Chloride: 102 mmol/L (ref 98–110)
Creat: 0.9 mg/dL (ref 0.70–1.18)
GFR, Est African American: 99 mL/min/{1.73_m2} (ref >=60)
GFR, Est Non African American: 85 mL/min/{1.73_m2} (ref >=60)
Globulin: 2.4 g/dL (calc) (ref 1.9–3.7)
Glucose, Bld: 152 mg/dL — ABNORMAL HIGH (ref 65–99)
Potassium: 4.6 mmol/L (ref 3.5–5.3)
Sodium: 137 mmol/L (ref 135–146)
Total Bilirubin: 1.2 mg/dL (ref 0.2–1.2)
Total Protein: 6.4 g/dL (ref 6.1–8.1)

## 2019-04-16 LAB — LIPID PANEL
Cholesterol: 99 mg/dL (ref <200)
HDL: 38 mg/dL — ABNORMAL LOW (ref >=40)
LDL Cholesterol (Calc): 44 mg/dL (calc)
Non-HDL Cholesterol (Calc): 61 mg/dL (calc) (ref <130)
Total CHOL/HDL Ratio: 2.6 (calc) (ref <5.0)
Triglycerides: 90 mg/dL (ref <150)

## 2019-04-16 LAB — MICROALBUMIN, URINE: Microalb, Ur: 0.7 mg/dL

## 2019-04-16 LAB — HEMOGLOBIN A1C
Hgb A1c MFr Bld: 7.8 % of total Hgb — ABNORMAL HIGH (ref <5.7)
Mean Plasma Glucose: 177 (calc)
eAG (mmol/L): 9.8 (calc)

## 2019-04-20 DIAGNOSIS — N401 Enlarged prostate with lower urinary tract symptoms: Secondary | ICD-10-CM | POA: Diagnosis not present

## 2019-04-20 DIAGNOSIS — R3915 Urgency of urination: Secondary | ICD-10-CM | POA: Diagnosis not present

## 2019-04-20 DIAGNOSIS — N430 Encysted hydrocele: Secondary | ICD-10-CM | POA: Diagnosis not present

## 2019-04-29 DIAGNOSIS — L570 Actinic keratosis: Secondary | ICD-10-CM | POA: Diagnosis not present

## 2019-04-29 DIAGNOSIS — Z85828 Personal history of other malignant neoplasm of skin: Secondary | ICD-10-CM | POA: Diagnosis not present

## 2019-04-29 DIAGNOSIS — L821 Other seborrheic keratosis: Secondary | ICD-10-CM | POA: Diagnosis not present

## 2019-04-29 DIAGNOSIS — C4442 Squamous cell carcinoma of skin of scalp and neck: Secondary | ICD-10-CM | POA: Diagnosis not present

## 2019-04-29 DIAGNOSIS — D485 Neoplasm of uncertain behavior of skin: Secondary | ICD-10-CM | POA: Diagnosis not present

## 2019-06-05 ENCOUNTER — Other Ambulatory Visit: Payer: Self-pay | Admitting: Family Medicine

## 2019-06-10 ENCOUNTER — Telehealth: Payer: Self-pay | Admitting: Family Medicine

## 2019-06-10 NOTE — Chronic Care Management (AMB) (Signed)
  Chronic Care Management   Note  06/10/2019 Name: Patrick Luna MRN: [id number] DOB: Mar 09, 1945  Patrick Luna is a 74 y.o. year old male who is a primary care patient of Susy Frizzle, MD. I reached out to Colin Rhein by phone today in response to a referral sent by [name]'s PCP, Susy Frizzle, MD.   [name] was given information about Chronic Care Management services today including:  1. CCM service includes personalized support from designated clinical staff supervised by his physician, including individualized plan of care and coordination with other care providers 2. 24/7 contact phone numbers for assistance for urgent and routine care needs. 3. Service will only be billed when office clinical staff spend 20 minutes or more in a month to coordinate care. 4. Only one practitioner may furnish and bill the service in a calendar month. 5. The patient may stop CCM services at any time (effective at the end of the month) by phone call to the office staff.   Patient agreed to services and verbal consent obtained.   Follow up plan:   [name]

## 2019-06-26 ENCOUNTER — Other Ambulatory Visit: Payer: Self-pay | Admitting: Family Medicine

## 2019-06-26 DIAGNOSIS — E7800 Pure hypercholesterolemia, unspecified: Secondary | ICD-10-CM

## 2019-06-26 DIAGNOSIS — E119 Type 2 diabetes mellitus without complications: Secondary | ICD-10-CM

## 2019-06-26 DIAGNOSIS — I10 Essential (primary) hypertension: Secondary | ICD-10-CM

## 2019-06-26 NOTE — Chronic Care Management (AMB) (Addendum)
Chronic Care Management Pharmacy  Name: Patrick Luna  MRN: [id number] DOB: Jul 22, 1945   Chief Complaint/ HPI  Patrick Luna,  74 y.o. , male presents for their Initial CCM visit with the clinical pharmacist via telephone.  PCP : Susy Frizzle, MD  Their chronic conditions include: GERD, Type II DM, hypercholesterolemia.  Office Visits:  04/07/2019 [name]) - diabetes follow up, normal foot exam, goal A1c is < 6.5, goal LDL < 100  Consult Visit:  Medications: Outpatient Encounter Medications as of 06/29/2019  Medication Sig   aspirin EC 81 MG tablet Take 81 mg by mouth daily.   atorvastatin (LIPITOR) 20 MG tablet TAKE 1 TABLET BY MOUTH EVERY DAY   FREESTYLE LITE test strip USE TO TEST BLOOD SUGAR ONCE DAILY   isosorbide mononitrate (IMDUR) 60 MG 24 hr tablet Take 60 mg by mouth daily.   Lancets (FREESTYLE) lancets Check BS BID - DX: E11.9   lisinopril (ZESTRIL) 10 MG tablet TAKE 1 TABLET BY MOUTH EVERY DAY   metFORMIN (GLUCOPHAGE) 500 MG tablet TAKE 1 TABLET BY MOUTH TWICE A DAY WITH MEALS   nitroGLYCERIN (NITROSTAT) 0.4 MG SL tablet Place 0.4 mg under the tongue every 5 (five) minutes as needed for chest pain.   No facility-administered encounter medications on file as of 06/29/2019.     Current Diagnosis/Assessment:  Goals Addressed             This Visit's Progress    Care Plan:       CARE PLAN ENTRY  Current Barriers:  Chronic Disease Management support, education, and care coordination needs related to Hyperlipidemia and Diabetes  Hyperlipidemia Pharmacist Clinical Goal(s): Over the next 90 days, patient will work with PharmD and providers to maintain LDL goal < 100 Current regimen:  Atorvastatin 20mg  daily Interventions: Continue current medication Patient self care activities - Over the next 90 days, patient will: Continue to take medication as directed Contact PharmD or PCP with and questions or concerns  Diabetes Pharmacist  Clinical Goal(s): Over the next 90 days, patient will work with PharmD and providers to achieve A1c goal <6.5% Current regimen:  Metformin 1000mg  twice daily Interventions: Recommend switching to metformin XR 500mg  (two tablets twice daily) in hopes of less side effects Patient self care activities - Over the next 90 days, patient will: Check blood sugar once daily, document, and provide at future appointments Contact provider with any episodes of hypoglycemia  Initial goal documentation         Diabetes   Recent Relevant Labs: Lab Results  Component Value Date/Time   HGBA1C 7.8 (H) 04/15/2019 09:53 AM   HGBA1C 6.6 (H) 09/06/2017 03:09 PM   HGBA1C 5.8 (H) 01/14/2013 10:26 AM   MICROALBUR 0.7 04/15/2019 09:53 AM   MICROALBUR 0.8 09/06/2017 03:21 PM     Checking BG: Daily  Recent FBG Readings: 125-135  Patient is currently uncontrolled on the following medications: metformin 1000mg  bid  Last diabetic Foot exam:  Lab Results  Component Value Date/Time   HMDIABEYEEXA No Retinopathy 11/06/2018 12:00 AM    Last diabetic Eye exam: No results found for: HMDIABFOOTEX   We discussed: how to recognize and treat signs of hypoglycemia.  Patient reported some diarrhea with increased dose of metformin.  Sometimes so bad he chooses not to take medication so that he does not have to run to the bathroom.  Willing to try another option that may not cause this.  Plan  Recommend switching to ER version of  metformin to try and resolve diarrhea.  Follow-up in one month to evaluate.  Hyperlipidemia   Lipid Panel     Component Value Date/Time   CHOL 99 04/15/2019 0953   TRIG 90 04/15/2019 0953   HDL 38 (L) 04/15/2019 0953   CHOLHDL 2.6 04/15/2019 0953   VLDL 19 06/21/2016 1104   LDLCALC 44 04/15/2019 0953    Patient has failed these meds in past: none noted Patient is currently controlled on the following medications: atorvastatin 20mg  daily  No adverse effects currently,  patient taking correctly  Plan  Continue current medications      Vaccines   Reviewed and discussed patient's vaccination history.    Immunization History  Administered Date(s) Administered   Influenza,inj,Quad PF,6+ Mos 12/10/2014   Pneumococcal Conjugate-13 06/18/2016   Pneumococcal Polysaccharide-23 12/10/2014    Plan  Recommended patient receive shingles vaccine in pharmacy.   Medication Management   Miscellaneous medications: nitroglycerin 0.4mg  SL OTC's: ASA 81mg ,  Patient currently uses [facility].  Phone #  [phone] Patient reports using pill box method to organize medications and promote adherence. Patient denies missed doses of medication besides intentional missed doses of his metformin due to adverse effects.   Beverly Milch, PharmD Clinical Pharmacist [place] [phone]   I have collaborated with the care management provider regarding care management and care coordination activities outlined in this encounter and have reviewed this encounter including documentation in the note and care plan. I am certifying that I agree with the content of this note and encounter as supervising physician.

## 2019-06-29 ENCOUNTER — Ambulatory Visit: Payer: PPO | Admitting: Pharmacist

## 2019-06-29 ENCOUNTER — Other Ambulatory Visit: Payer: Self-pay

## 2019-06-29 DIAGNOSIS — E119 Type 2 diabetes mellitus without complications: Secondary | ICD-10-CM

## 2019-06-29 DIAGNOSIS — E7800 Pure hypercholesterolemia, unspecified: Secondary | ICD-10-CM

## 2019-06-29 NOTE — Patient Instructions (Addendum)
Thank you for meeting with me today!  I look forward to working with you to help you meet all of your healthcare goals and answer any questions you may have.  Feel free to contact me anytime!   Visit Information  Goals Addressed            This Visit's Progress   . Care Plan:       CARE PLAN ENTRY  Current Barriers:  . Chronic Disease Management support, education, and care coordination needs related to Hyperlipidemia and Diabetes  Hyperlipidemia . Pharmacist Clinical Goal(s): o Over the next 90 days, patient will work with PharmD and providers to maintain LDL goal < 100 . Current regimen:  o Atorvastatin 20mg  daily . Interventions: o Continue current medication . Patient self care activities - Over the next 90 days, patient will: o Continue to take medication as directed o Contact PharmD or PCP with and questions or concerns  Diabetes . Pharmacist Clinical Goal(s): o Over the next 90 days, patient will work with PharmD and providers to achieve A1c goal <6.5% . Current regimen:  o Metformin 1000mg  twice daily . Interventions: o Recommend switching to metformin XR 500mg  (two tablets twice daily) in hopes of less side effects . Patient self care activities - Over the next 90 days, patient will: o Check blood sugar once daily, document, and provide at future appointments o Contact provider with any episodes of hypoglycemia  Initial goal documentation        Mr. Gruhlke was given information about Chronic Care Management services today including:  1. CCM service includes personalized support from designated clinical staff supervised by his physician, including individualized plan of care and coordination with other care providers 2. 24/7 contact phone numbers for assistance for urgent and routine care needs. 3. Standard insurance, coinsurance, copays and deductibles apply for chronic care management only during months in which we provide at least 20 minutes of these  services. Most insurances cover these services at 100%, however patients may be responsible for any copay, coinsurance and/or deductible if applicable. This service may help you avoid the need for more expensive face-to-face services. 4. Only one practitioner may furnish and bill the service in a calendar month. 5. The patient may stop CCM services at any time (effective at the end of the month) by phone call to the office staff.  Patient agreed to services and verbal consent obtained.   The patient verbalized understanding of instructions provided today and agreed to receive a mailed copy of patient instruction and/or educational materials. Telephone follow up appointment with pharmacy team member scheduled for:  Beverly Milch, PharmD Clinical Pharmacist [facility] [phone]   Diabetes Mellitus and [place] care is an important part of your health, especially when you have diabetes. Diabetes may cause you to have problems because of poor blood flow (circulation) to your feet and legs, which can cause your skin to:  Become thinner and drier.  Break more easily.  Heal more slowly.  Peel and crack. You may also have nerve damage (neuropathy) in your legs and feet, causing decreased feeling in them. This means that you may not notice minor injuries to your feet that could lead to more serious problems. Noticing and addressing any potential problems early is the best way to prevent future foot problems. How to care for your feet Foot hygiene  Wash your feet daily with warm water and mild soap. Do not use hot water. Then, pat  your feet and the areas between your toes until they are completely dry. Do not soak your feet as this can dry your skin.  Trim your toenails straight across. Do not dig under them or around the cuticle. File the edges of your nails with an emery board or nail file.  Apply a moisturizing lotion or petroleum jelly to the skin on your  feet and to dry, brittle toenails. Use lotion that does not contain alcohol and is unscented. Do not apply lotion between your toes. Shoes and socks  Wear clean socks or stockings every day. Make sure they are not too tight. Do not wear knee-high stockings since they may decrease blood flow to your legs.  Wear shoes that fit properly and have enough cushioning. Always look in your shoes before you put them on to be sure there are no objects inside.  To break in new shoes, wear them for just a few hours a day. This prevents injuries on your feet. Wounds, scrapes, corns, and calluses  Check your feet daily for blisters, cuts, bruises, sores, and redness. If you cannot see the bottom of your feet, use a mirror or ask someone for help.  Do not cut corns or calluses or try to remove them with medicine.  If you find a minor scrape, cut, or break in the skin on your feet, keep it and the skin around it clean and dry. You may clean these areas with mild soap and water. Do not clean the area with peroxide, alcohol, or iodine.  If you have a wound, scrape, corn, or callus on your foot, look at it several times a day to make sure it is healing and not infected. Check for: ? Redness, swelling, or pain. ? Fluid or blood. ? Warmth. ? Pus or a bad smell. General instructions  Do not cross your legs. This may decrease blood flow to your feet.  Do not use heating pads or hot water bottles on your feet. They may burn your skin. If you have lost feeling in your feet or legs, you may not know this is happening until it is too late.  Protect your feet from hot and cold by wearing shoes, such as at the beach or on hot pavement.  Schedule a complete foot exam at least once a year (annually) or more often if you have foot problems. If you have foot problems, report any cuts, sores, or bruises to your health care provider immediately. Contact a health care provider if:  You have a medical condition that  increases your risk of infection and you have any cuts, sores, or bruises on your feet.  You have an injury that is not healing.  You have redness on your legs or feet.  You feel burning or tingling in your legs or feet.  You have pain or cramps in your legs and feet.  Your legs or feet are numb.  Your feet always feel cold.  You have pain around a toenail. Get help right away if:  You have a wound, scrape, corn, or callus on your foot and: ? You have pain, swelling, or redness that gets worse. ? You have fluid or blood coming from the wound, scrape, corn, or callus. ? Your wound, scrape, corn, or callus feels warm to the touch. ? You have pus or a bad smell coming from the wound, scrape, corn, or callus. ? You have a fever. ? You have a red line going up your leg.  Summary  Check your feet every day for cuts, sores, red spots, swelling, and blisters.  Moisturize feet and legs daily.  Wear shoes that fit properly and have enough cushioning.  If you have foot problems, report any cuts, sores, or bruises to your health care provider immediately.  Schedule a complete foot exam at least once a year (annually) or more often if you have foot problems. This information is not intended to replace advice given to you by your health care provider. Make sure you discuss any questions you have with your health care provider. Document Revised: 10/22/2018 Document Reviewed: 03/02/2016 Elsevier Patient Education  [place].

## 2019-06-30 ENCOUNTER — Other Ambulatory Visit: Payer: Self-pay | Admitting: Family Medicine

## 2019-06-30 MED ORDER — METFORMIN HCL ER 500 MG PO TB24: 2000.0000 mg | ORAL_TABLET | Freq: Every day | ORAL | 1 refills | Status: DC

## 2019-07-28 ENCOUNTER — Telehealth: Payer: Self-pay

## 2019-08-15 ENCOUNTER — Other Ambulatory Visit: Payer: Self-pay | Admitting: Family Medicine

## 2019-08-21 ENCOUNTER — Other Ambulatory Visit: Payer: Self-pay

## 2019-08-21 ENCOUNTER — Other Ambulatory Visit: Payer: PPO

## 2019-08-21 DIAGNOSIS — E119 Type 2 diabetes mellitus without complications: Secondary | ICD-10-CM

## 2019-08-22 LAB — HEMOGLOBIN A1C
Hgb A1c MFr Bld: 7.7 % of total Hgb — ABNORMAL HIGH (ref <5.7)
Mean Plasma Glucose: 174 (calc)
eAG (mmol/L): 9.7 (calc)

## 2019-08-26 ENCOUNTER — Other Ambulatory Visit: Payer: Self-pay

## 2019-08-26 DIAGNOSIS — E119 Type 2 diabetes mellitus without complications: Secondary | ICD-10-CM

## 2019-08-26 MED ORDER — EMPAGLIFLOZIN 25 MG PO TABS
25.0000 mg | ORAL_TABLET | Freq: Every day | ORAL | 3 refills | Status: DC
Start: 2019-08-26 — End: 2020-01-04

## 2019-09-17 ENCOUNTER — Other Ambulatory Visit: Payer: Self-pay | Admitting: Family Medicine

## 2019-10-04 ENCOUNTER — Emergency Department (HOSPITAL_COMMUNITY)
Admission: EM | Admit: 2019-10-04 | Discharge: 2019-10-05 | Disposition: A | Discharge status: Home / Self Care | Payer: PPO | Attending: Emergency Medicine | Admitting: Emergency Medicine

## 2019-10-04 ENCOUNTER — Encounter (HOSPITAL_COMMUNITY): Payer: Self-pay

## 2019-10-04 ENCOUNTER — Other Ambulatory Visit: Payer: Self-pay

## 2019-10-04 ENCOUNTER — Emergency Department (HOSPITAL_COMMUNITY): Payer: PPO

## 2019-10-04 DIAGNOSIS — U071 COVID-19: Secondary | ICD-10-CM | POA: Insufficient documentation

## 2019-10-04 DIAGNOSIS — Z79899 Other long term (current) drug therapy: Secondary | ICD-10-CM | POA: Insufficient documentation

## 2019-10-04 DIAGNOSIS — Z87891 Personal history of nicotine dependence: Secondary | ICD-10-CM | POA: Diagnosis not present

## 2019-10-04 DIAGNOSIS — Z794 Long term (current) use of insulin: Secondary | ICD-10-CM | POA: Insufficient documentation

## 2019-10-04 DIAGNOSIS — R0602 Shortness of breath: Secondary | ICD-10-CM | POA: Diagnosis not present

## 2019-10-04 DIAGNOSIS — R000 Tachycardia, unspecified: Secondary | ICD-10-CM | POA: Diagnosis not present

## 2019-10-04 DIAGNOSIS — R531 Weakness: Secondary | ICD-10-CM | POA: Diagnosis not present

## 2019-10-04 DIAGNOSIS — R0789 Other chest pain: Secondary | ICD-10-CM | POA: Diagnosis not present

## 2019-10-04 DIAGNOSIS — E119 Type 2 diabetes mellitus without complications: Secondary | ICD-10-CM | POA: Diagnosis not present

## 2019-10-04 LAB — CBC WITH DIFFERENTIAL/PLATELET
Abs Immature Granulocytes: 0.01 10*3/uL (ref 0.00–0.07)
Basophils Absolute: 0 10*3/uL (ref 0.0–0.1)
Basophils Relative: 0 %
Eosinophils Absolute: 0 10*3/uL (ref 0.0–0.5)
Eosinophils Relative: 0 %
HCT: 45.1 % (ref 39.0–52.0)
Hemoglobin: 14.6 g/dL (ref 13.0–17.0)
Immature Granulocytes: 0 %
Lymphocytes Relative: 17 %
Lymphs Abs: 0.6 10*3/uL — ABNORMAL LOW (ref 0.7–4.0)
MCH: 29.9 pg (ref 26.0–34.0)
MCHC: 32.4 g/dL (ref 30.0–36.0)
MCV: 92.4 fL (ref 80.0–100.0)
Monocytes Absolute: 0.3 10*3/uL (ref 0.1–1.0)
Monocytes Relative: 7 %
Neutro Abs: 2.8 10*3/uL (ref 1.7–7.7)
Neutrophils Relative %: 76 %
Platelets: 133 10*3/uL — ABNORMAL LOW (ref 150–400)
RBC: 4.88 MIL/uL (ref 4.22–5.81)
RDW: 14.2 % (ref 11.5–15.5)
WBC: 3.7 10*3/uL — ABNORMAL LOW (ref 4.0–10.5)
nRBC: 0 % (ref 0.0–0.2)

## 2019-10-04 LAB — COMPREHENSIVE METABOLIC PANEL
ALT: 56 U/L — ABNORMAL HIGH (ref 0–44)
AST: 60 U/L — ABNORMAL HIGH (ref 15–41)
Albumin: 3.4 g/dL — ABNORMAL LOW (ref 3.5–5.0)
Alkaline Phosphatase: 48 U/L (ref 38–126)
Anion gap: 11 (ref 5–15)
BUN: 14 mg/dL (ref 8–23)
CO2: 21 mmol/L — ABNORMAL LOW (ref 22–32)
Calcium: 8.5 mg/dL — ABNORMAL LOW (ref 8.9–10.3)
Chloride: 101 mmol/L (ref 98–111)
Creatinine, Ser: 1.04 mg/dL (ref 0.61–1.24)
GFR calc Af Amer: >60 mL/min (ref >60)
GFR calc non Af Amer: >60 mL/min (ref >60)
Glucose, Bld: 180 mg/dL — ABNORMAL HIGH (ref 70–99)
Potassium: 4 mmol/L (ref 3.5–5.1)
Sodium: 133 mmol/L — ABNORMAL LOW (ref 135–145)
Total Bilirubin: 0.6 mg/dL (ref 0.3–1.2)
Total Protein: 6.8 g/dL (ref 6.5–8.1)

## 2019-10-04 LAB — LACTIC ACID, PLASMA: Lactic Acid, Venous: 1.2 mmol/L (ref 0.5–1.9)

## 2019-10-04 MED ORDER — ACETAMINOPHEN 325 MG PO TABS
650.0000 mg | ORAL_TABLET | Freq: Once | ORAL | Status: AC | PRN
  Administered 2019-10-04: 650 mg via ORAL
  Filled 2019-10-04: qty 2

## 2019-10-04 NOTE — ED Triage Notes (Signed)
Patient complains of OSB, fever and feeling as if he cant think clearly after being tested positive for Covid 8 days ago. Patient alert and oriented. Took ibuprofen prior to arrival

## 2019-10-04 NOTE — ED Notes (Signed)
[employer] please call with an update  [phone]

## 2019-10-05 ENCOUNTER — Telehealth: Payer: Self-pay | Admitting: Family Medicine

## 2019-10-05 LAB — URINALYSIS, ROUTINE W REFLEX MICROSCOPIC
Bilirubin Urine: NEGATIVE
Glucose, UA: >=500 mg/dL — AB
Ketones, ur: 5 mg/dL — AB
Leukocytes,Ua: NEGATIVE
Nitrite: NEGATIVE
Protein, ur: 30 mg/dL — AB
Specific Gravity, Urine: 1.023 (ref 1.005–1.030)
pH: 5 (ref 5.0–8.0)

## 2019-10-05 MED ORDER — SODIUM CHLORIDE 0.9 % IV BOLUS
500.0000 mL | Freq: Once | INTRAVENOUS | Status: AC
  Administered 2019-10-05: 500 mL via INTRAVENOUS

## 2019-10-05 MED ORDER — ACETAMINOPHEN 325 MG PO TABS
650.0000 mg | ORAL_TABLET | Freq: Once | ORAL | Status: AC
  Administered 2019-10-05: 650 mg via ORAL
  Filled 2019-10-05 (×2): qty 2

## 2019-10-05 MED ORDER — SODIUM CHLORIDE 0.9 % IV BOLUS: 750.0000 mL | Freq: Once | INTRAVENOUS | Status: DC

## 2019-10-05 NOTE — Telephone Encounter (Signed)
Patrick Luna wanted to let [name] Provider know that he's been in the hospital since Sunday evening, they've both tested positive for Covid.

## 2019-10-05 NOTE — ED Provider Notes (Signed)
[facility] EMERGENCY DEPARTMENT Provider Note   CSN: [id number] Arrival date & time: 10/04/19  1159    History Chief Complaint  Patient presents with  . Weakness    Patrick Luna is a 74 y.o. male with a history of DM, hypercholesterolemia, & GERD who is known COVID 19 on day 8 of symptoms who presents to the ED with complaints of generalized weakness. Patient states that he has been feeling poorly with high fevers, chills, nasal congestion, & productive cough with yellow phlegm sputum. He has also felt generally weak requiring more assistance from family to get out of bed and has felt confused. He further describes his confusion as feeling foggy and out of it as if he cannot think clearly. He has had poor appetite with small amounts of PO intake. I called & spoke with his daughter Patrick Luna for additional history- she confirms he has been weaker than normal and has seemed a bit out of it which prompted his ED visit, the patient denied shortness of breath however his daughter has noted his breathing appears more labored. The patient lives with his wife & his 23 year old mother- all 3 are sick with COVID. He is unvaccinated. He denies chest pain, dyspnea, N/V/D, abdominal pain, dysuria, focal numbness, weakness, or syncope.   HPI     Past Medical History:  Diagnosis Date  . Chronic sinusitis   . Diabetes mellitus without complication ([place])   . GERD (gastroesophageal reflux disease)   . Hepatic steatosis   . Hypercholesterolemia   . Jackhammer esophagus    [facility], botox, dilation, isosorbide    Patient Active Problem List   Diagnosis Date Noted  . Controlled type 2 diabetes mellitus without complication, without long-term current use of insulin ([place]) 09/06/2017  . Jackhammer esophagus   . GERD (gastroesophageal reflux disease) 12/31/2012  . Atypical chest pain 08/28/2010  . Hypercholesteremia 08/28/2010    Past Surgical History:  Procedure Laterality Date  .  COLONOSCOPY  2007  . CYSTECTOMY     BENIGN CYST REMOVAL FROM RIGHT SHOULDER  . ESOPHAGEAL MANOMETRY N/A 02/02/2013   Procedure: ESOPHAGEAL MANOMETRY (EM);  Surgeon: Cleotis Nipper, MD;  Location: [facility];  Service: Endoscopy;  Laterality: N/A;  . 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 07/23/2013   Procedure: RIGHT FOOT: EXCISION PARTIAL BONE TALUS/CALCANEUS, REPAIR RUPTURE  ACHILLES TENDON PRIMARY OPEN/PERCUTANEOUS;  Surgeon: Ninetta Lights, MD;  Location: [facility];  Service: Orthopedics;  Laterality: Right;  . HAND DEBRIDEMENT  2009   left       Family History  Problem Relation Age of Onset  . Hypertension Mother   . Hepatitis C Sister     Social History   Tobacco Use  . Smoking status: Former Smoker    Types: Cigarettes    Quit date: 11/27/1968    Years since quitting: 50.8  . Smokeless tobacco: Never Used  . Tobacco comment: QUIT 1970  Substance Use Topics  . Alcohol use: No  . Drug use: No    Home Medications Prior to Admission medications   Medication Sig Start Date End Date Taking? Authorizing Provider  aspirin EC 81 MG tablet Take 81 mg by mouth daily.    [provider]  atorvastatin (LIPITOR) 20 MG tablet TAKE 1 TABLET BY MOUTH EVERY DAY 06/05/19   Susy Frizzle, MD  empagliflozin (JARDIANCE) 25 MG TABS tablet Take 1 tablet (25 mg total) by mouth daily before breakfast. 08/26/19  Susy Frizzle, MD  FREESTYLE LITE test strip USE TO TEST BLOOD SUGAR ONCE DAILY 12/09/18   Susy Frizzle, MD  isosorbide mononitrate (IMDUR) 60 MG 24 hr tablet Take 60 mg by mouth daily.    [provider]  Lancets (FREESTYLE) lancets Check BS BID - DX: E11.9 01/20/18   Susy Frizzle, MD  lisinopril (ZESTRIL) 10 MG tablet TAKE 1 TABLET BY MOUTH EVERY DAY 02/25/19   Susy Frizzle, MD  metFORMIN (GLUCOPHAGE-XR) 500 MG 24 hr tablet TAKE 4 TABLETS BY MOUTH EVERY DAY WITH BREAKFAST 09/17/19   Susy Frizzle, MD    nitroGLYCERIN (NITROSTAT) 0.4 MG SL tablet Place 0.4 mg under the tongue every 5 (five) minutes as needed for chest pain.    [provider]    Allergies    Ampicillin  Review of Systems   Review of Systems  Constitutional: Positive for chills, fatigue and fever.  HENT: Positive for congestion. Negative for ear pain and sore throat.   Respiratory: Positive for cough. Negative for shortness of breath.   Cardiovascular: Negative for chest pain and leg swelling.  Gastrointestinal: Negative for abdominal pain, diarrhea, nausea and vomiting.  Genitourinary: Negative for dysuria.  Neurological: Positive for weakness. Negative for syncope, speech difficulty and numbness.  All other systems reviewed and are negative.   Physical Exam Updated Vital Signs BP 113/60   Pulse 99   Temp (!) 100.7 F (38.2 C) (Oral)   Resp (!) 24   Ht 5\' 6"  (1.676 m)   Wt 95.3 kg   SpO2 96%   BMI 33.89 kg/m   Physical Exam Vitals and nursing note reviewed.  Constitutional:      General: He is not in acute distress.    Appearance: He is well-developed. He is not toxic-appearing.  HENT:     Head: Normocephalic and atraumatic.     Mouth/Throat:     Mouth: Mucous membranes are dry.  Eyes:     General:        Right eye: No discharge.        Left eye: No discharge.     Conjunctiva/sclera: Conjunctivae normal.  Cardiovascular:     Rate and Rhythm: Regular rhythm. Tachycardia present.  Pulmonary:     Effort: Pulmonary effort is normal. No respiratory distress.     Breath sounds: Normal breath sounds. No wheezing, rhonchi or rales.     Comments: Intermittently tachypneic.  Abdominal:     General: There is no distension.     Palpations: Abdomen is soft.     Tenderness: There is no abdominal tenderness.  Musculoskeletal:     Cervical back: Neck supple. No rigidity.     Right lower leg: No edema.     Left lower leg: No edema.  Skin:    General: Skin is warm and dry.     Findings: No rash.   Neurological:     Mental Status: He is alert.     Comments: Clear speech. CN III-XII Grossly intact. Sensation grossly intact x 4. Symmetric strength x 4. Ambulatory with slow gait.   Psychiatric:        Behavior: Behavior normal.    ED Results / Procedures / Treatments   Labs (all labs ordered are listed, but only abnormal results are displayed) Labs Reviewed  COMPREHENSIVE METABOLIC PANEL - Abnormal; Notable for the following components:      Result Value   Sodium 133 (*)    CO2 21 (*)  Glucose, Bld 180 (*)    Calcium 8.5 (*)    Albumin 3.4 (*)    AST 60 (*)    ALT 56 (*)    All other components within normal limits  CBC WITH DIFFERENTIAL/PLATELET - Abnormal; Notable for the following components:   WBC 3.7 (*)    Platelets 133 (*)    Lymphs Abs 0.6 (*)    All other components within normal limits  LACTIC ACID, PLASMA  LACTIC ACID, PLASMA  URINALYSIS, ROUTINE W REFLEX MICROSCOPIC    EKG EKG Interpretation  Date/Time:  Sunday October 04 2019 12:30:26 EDT Ventricular Rate:  100 PR Interval:  142 QRS Duration: 82 QT Interval:  330 QTC Calculation: 425 R Axis:   47 Text Interpretation: Normal sinus rhythm Normal ECG Confirmed by Merrily Pew [phone]) on 10/05/2019 1:06:18 AM   Radiology DG Chest Portable 1 View  Result Date: 10/04/2019 CLINICAL DATA:  COVID, shortness of breath EXAM: PORTABLE CHEST 1 VIEW COMPARISON:  Chest x-ray from 2012 FINDINGS: Image rotated to the LEFT. Accounting for this cardiomediastinal contours and hilar structures are normal. Lungs are clear.  No evidence of effusion on frontal view. On limited assessment skeletal structures are unremarkable. IMPRESSION: No acute cardiopulmonary disease. Electronically Signed   By: Zetta Bills M.D.   On: 10/04/2019 13:46    Procedures Procedures (including critical care time)  Medications Ordered in ED Medications  acetaminophen (TYLENOL) tablet 650 mg (has no administration in time range)  sodium  chloride 0.9 % bolus 500 mL (has no administration in time range)  acetaminophen (TYLENOL) tablet 650 mg (650 mg Oral Given 10/04/19 2222)    ED Course  I have reviewed the triage vital signs and the nursing notes.  Pertinent labs & imaging results that were available during my care of the patient were reviewed by me and considered in my medical decision making (see chart for details).    COVE HAYDON was evaluated in Emergency Department on 10/05/2019 for the symptoms described in the history of present illness. He/she was evaluated in the context of the global COVID-19 pandemic, which necessitated consideration that the patient might be at risk for infection with the SARS-CoV-2 virus that causes COVID-19. Institutional protocols and algorithms that pertain to the evaluation of patients at risk for COVID-19 are in a state of rapid change based on information released by regulatory bodies including the CDC and federal and state organizations. These policies and algorithms were followed during the patient's care in the ED.  MDM Rules/Calculators/A&P                         Patient presents to the ED with complaints of weakness, fevers, & some confusion in setting of COVID 19. On my assessment he is febrile, mildly tachycardic, intermittently tachypneic but maintaining SpO2 appropriately without obvious respiratory distress.   Additional history obtained:  Additional history obtained from patient's daughter via telephone. Previous records obtained and reviewed.   Lab Tests:  I reviewed and interpreted labs, which included:  CBC: Leukopenia & thrombocytopenia, no anemia.  CMP: Mild electrolyte abnormalities as above, mild transaminitis likely secondary to COVID 19 Lactic acid: WNL UA: No UTI.   Imaging Studies ordered:  CXR ordered by triage, I independently visualized and interpreted imaging which showed no acute cardiopulmonary disease.   07:25: Tylenol ordered for fever, fluids ordered  as patient has had poor PO intake & has somewhat dry mucous membranes.   11:00: RE-EVAL:  Patient feeling improved, ambulated in exam room without significant tachypnea, SpO2 93-98%. He is tolerating PO, alert & oriented, and overall appears appropriate for discharge- he is in agreement. I discussed results, treatment plan, need for follow-up, and return precautions with the patient and his daughter via telephone. Provided opportunity for questions, patient & his daughter confirmed understanding and are in agreement with plan.   Findings and plan of care discussed with supervising physician Dr. Regenia Skeeter who is in agreement.   Portions of this note were generated with [employer]. Dictation errors may occur despite best attempts at proofreading.  Final Clinical Impression(s) / ED Diagnoses Final diagnoses:  Weakness  COVID-19    Rx / DC Orders ED Discharge Orders    None       Amaryllis Dyke, PA-C 10/05/19 1234    Sherwood Gambler, MD 10/05/19 1517

## 2019-10-05 NOTE — Discharge Instructions (Addendum)
You were seen in the ER today for symptoms we suspect are related to your diagnose of COVID.   Your chest x-ray and EKG were reassuring.  Your labs showed that you were a bit dehydrated, please continue to drink plenty of fluids.   Take tylenol as needed for fevers per over the counter dosing. Your labs did show that your liver function tests were mildly elevated- please have this rechecked by a primary care provider. You may also take 400 mg of ibuprofen every 6 hours as needed for fever as well.   Please follow up with your primary care provider within 3 days. Return to the ER for new or worsening symptoms including but not limited to trouble breathing, chest pain, passing out, decreased urination, increased or persistent confusion, worsened weakness or any other concerns.

## 2019-10-09 ENCOUNTER — Inpatient Hospital Stay (HOSPITAL_COMMUNITY)
Admission: EM | Admit: 2019-10-09 | Discharge: 2019-10-14 | DRG: 177 | Disposition: A | Discharge status: Home / Self Care | Payer: PPO | Attending: Internal Medicine | Admitting: Internal Medicine

## 2019-10-09 ENCOUNTER — Other Ambulatory Visit: Payer: Self-pay

## 2019-10-09 ENCOUNTER — Emergency Department (HOSPITAL_COMMUNITY): Payer: PPO

## 2019-10-09 DIAGNOSIS — E119 Type 2 diabetes mellitus without complications: Secondary | ICD-10-CM | POA: Diagnosis present

## 2019-10-09 DIAGNOSIS — J9601 Acute respiratory failure with hypoxia: Secondary | ICD-10-CM | POA: Diagnosis not present

## 2019-10-09 DIAGNOSIS — Z79899 Other long term (current) drug therapy: Secondary | ICD-10-CM | POA: Diagnosis not present

## 2019-10-09 DIAGNOSIS — Z7982 Long term (current) use of aspirin: Secondary | ICD-10-CM

## 2019-10-09 DIAGNOSIS — Z6833 Body mass index (BMI) 33.0-33.9, adult: Secondary | ICD-10-CM

## 2019-10-09 DIAGNOSIS — R197 Diarrhea, unspecified: Secondary | ICD-10-CM | POA: Diagnosis not present

## 2019-10-09 DIAGNOSIS — K760 Fatty (change of) liver, not elsewhere classified: Secondary | ICD-10-CM | POA: Diagnosis present

## 2019-10-09 DIAGNOSIS — U071 COVID-19: Principal | ICD-10-CM | POA: Diagnosis present

## 2019-10-09 DIAGNOSIS — K219 Gastro-esophageal reflux disease without esophagitis: Secondary | ICD-10-CM | POA: Diagnosis present

## 2019-10-09 DIAGNOSIS — I10 Essential (primary) hypertension: Secondary | ICD-10-CM | POA: Diagnosis present

## 2019-10-09 DIAGNOSIS — Z880 Allergy status to penicillin: Secondary | ICD-10-CM | POA: Diagnosis not present

## 2019-10-09 DIAGNOSIS — Z7984 Long term (current) use of oral hypoglycemic drugs: Secondary | ICD-10-CM | POA: Diagnosis not present

## 2019-10-09 DIAGNOSIS — J9600 Acute respiratory failure, unspecified whether with hypoxia or hypercapnia: Secondary | ICD-10-CM | POA: Diagnosis present

## 2019-10-09 DIAGNOSIS — R0602 Shortness of breath: Secondary | ICD-10-CM | POA: Diagnosis not present

## 2019-10-09 DIAGNOSIS — K224 Dyskinesia of esophagus: Secondary | ICD-10-CM | POA: Diagnosis not present

## 2019-10-09 DIAGNOSIS — E785 Hyperlipidemia, unspecified: Secondary | ICD-10-CM | POA: Diagnosis present

## 2019-10-09 DIAGNOSIS — Z8249 Family history of ischemic heart disease and other diseases of the circulatory system: Secondary | ICD-10-CM

## 2019-10-09 DIAGNOSIS — E7800 Pure hypercholesterolemia, unspecified: Secondary | ICD-10-CM | POA: Diagnosis present

## 2019-10-09 DIAGNOSIS — J1282 Pneumonia due to coronavirus disease 2019: Secondary | ICD-10-CM | POA: Diagnosis present

## 2019-10-09 DIAGNOSIS — E669 Obesity, unspecified: Secondary | ICD-10-CM | POA: Diagnosis not present

## 2019-10-09 DIAGNOSIS — R0902 Hypoxemia: Secondary | ICD-10-CM | POA: Diagnosis not present

## 2019-10-09 DIAGNOSIS — Z87891 Personal history of nicotine dependence: Secondary | ICD-10-CM

## 2019-10-09 DIAGNOSIS — R05 Cough: Secondary | ICD-10-CM | POA: Diagnosis not present

## 2019-10-09 LAB — COMPREHENSIVE METABOLIC PANEL
ALT: 49 U/L — ABNORMAL HIGH (ref 0–44)
AST: 65 U/L — ABNORMAL HIGH (ref 15–41)
Albumin: 3 g/dL — ABNORMAL LOW (ref 3.5–5.0)
Alkaline Phosphatase: 45 U/L (ref 38–126)
Anion gap: 12 (ref 5–15)
BUN: 21 mg/dL (ref 8–23)
CO2: 21 mmol/L — ABNORMAL LOW (ref 22–32)
Calcium: 8.5 mg/dL — ABNORMAL LOW (ref 8.9–10.3)
Chloride: 102 mmol/L (ref 98–111)
Creatinine, Ser: 1.05 mg/dL (ref 0.61–1.24)
GFR calc Af Amer: >60 mL/min (ref >60)
GFR calc non Af Amer: >60 mL/min (ref >60)
Glucose, Bld: 103 mg/dL — ABNORMAL HIGH (ref 70–99)
Potassium: 3.7 mmol/L (ref 3.5–5.1)
Sodium: 135 mmol/L (ref 135–145)
Total Bilirubin: 1 mg/dL (ref 0.3–1.2)
Total Protein: 6.5 g/dL (ref 6.5–8.1)

## 2019-10-09 LAB — CBC WITH DIFFERENTIAL/PLATELET
Abs Immature Granulocytes: 0.02 10*3/uL (ref 0.00–0.07)
Basophils Absolute: 0 10*3/uL (ref 0.0–0.1)
Basophils Relative: 0 %
Eosinophils Absolute: 0 10*3/uL (ref 0.0–0.5)
Eosinophils Relative: 0 %
HCT: 43.7 % (ref 39.0–52.0)
Hemoglobin: 14.3 g/dL (ref 13.0–17.0)
Immature Granulocytes: 1 %
Lymphocytes Relative: 16 %
Lymphs Abs: 0.6 10*3/uL — ABNORMAL LOW (ref 0.7–4.0)
MCH: 30 pg (ref 26.0–34.0)
MCHC: 32.7 g/dL (ref 30.0–36.0)
MCV: 91.8 fL (ref 80.0–100.0)
Monocytes Absolute: 0.1 10*3/uL (ref 0.1–1.0)
Monocytes Relative: 4 %
Neutro Abs: 2.8 10*3/uL (ref 1.7–7.7)
Neutrophils Relative %: 79 %
Platelets: 192 10*3/uL (ref 150–400)
RBC: 4.76 MIL/uL (ref 4.22–5.81)
RDW: 14.1 % (ref 11.5–15.5)
WBC: 3.6 10*3/uL — ABNORMAL LOW (ref 4.0–10.5)
nRBC: 0 % (ref 0.0–0.2)

## 2019-10-09 LAB — FERRITIN: Ferritin: 1027 ng/mL — ABNORMAL HIGH (ref 24–336)

## 2019-10-09 LAB — FIBRINOGEN: Fibrinogen: 568 mg/dL — ABNORMAL HIGH (ref 210–475)

## 2019-10-09 LAB — PROCALCITONIN: Procalcitonin: <0.1 ng/mL

## 2019-10-09 LAB — LACTATE DEHYDROGENASE: LDH: 368 U/L — ABNORMAL HIGH (ref 98–192)

## 2019-10-09 LAB — D-DIMER, QUANTITATIVE: D-Dimer, Quant: 1 ug/mL-FEU — ABNORMAL HIGH (ref 0.00–0.50)

## 2019-10-09 LAB — C-REACTIVE PROTEIN: CRP: 8.7 mg/dL — ABNORMAL HIGH (ref <1.0)

## 2019-10-09 LAB — LACTIC ACID, PLASMA: Lactic Acid, Venous: 1.8 mmol/L (ref 0.5–1.9)

## 2019-10-09 LAB — TRIGLYCERIDES: Triglycerides: 66 mg/dL (ref <150)

## 2019-10-09 MED ORDER — DEXAMETHASONE SODIUM PHOSPHATE 10 MG/ML IJ SOLN: 6.0000 mg | INTRAMUSCULAR | Status: DC

## 2019-10-09 MED ORDER — SODIUM CHLORIDE 0.9% FLUSH: 3.0000 mL | INTRAVENOUS | Status: DC | PRN

## 2019-10-09 MED ORDER — SODIUM CHLORIDE 0.9% FLUSH
3.0000 mL | Freq: Two times a day (BID) | INTRAVENOUS | Status: DC
  Administered 2019-10-09 – 2019-10-10 (×3): 3 mL via INTRAVENOUS

## 2019-10-09 MED ORDER — HYDRALAZINE HCL 20 MG/ML IJ SOLN: 5.0000 mg | INTRAMUSCULAR | Status: DC | PRN

## 2019-10-09 MED ORDER — AEROCHAMBER PLUS FLO-VU LARGE MISC
1.0000 | Freq: Once | Status: AC
  Administered 2019-10-09: 1

## 2019-10-09 MED ORDER — ONDANSETRON HCL 4 MG PO TABS: 4.0000 mg | ORAL_TABLET | Freq: Four times a day (QID) | ORAL | Status: DC | PRN

## 2019-10-09 MED ORDER — ALBUTEROL SULFATE HFA 108 (90 BASE) MCG/ACT IN AERS
2.0000 | INHALATION_SPRAY | Freq: Once | RESPIRATORY_TRACT | Status: AC
  Administered 2019-10-09: 2 via RESPIRATORY_TRACT
  Filled 2019-10-09: qty 6.7

## 2019-10-09 MED ORDER — ACETAMINOPHEN 325 MG PO TABS: 650.0000 mg | ORAL_TABLET | Freq: Four times a day (QID) | ORAL | Status: DC | PRN

## 2019-10-09 MED ORDER — ONDANSETRON HCL 4 MG/2ML IJ SOLN: 4.0000 mg | Freq: Four times a day (QID) | INTRAMUSCULAR | Status: DC | PRN

## 2019-10-09 MED ORDER — POTASSIUM CHLORIDE IN NACL 20-0.9 MEQ/L-% IV SOLN
INTRAVENOUS | Status: DC
  Filled 2019-10-09 (×2): qty 1000

## 2019-10-09 MED ORDER — INSULIN ASPART 100 UNIT/ML ~~LOC~~ SOLN
0.0000 [IU] | Freq: Three times a day (TID) | SUBCUTANEOUS | Status: DC
  Administered 2019-10-10 (×2): 2 [IU] via SUBCUTANEOUS
  Administered 2019-10-10 – 2019-10-11 (×2): 3 [IU] via SUBCUTANEOUS
  Administered 2019-10-11: 8 [IU] via SUBCUTANEOUS
  Administered 2019-10-11 – 2019-10-12 (×3): 5 [IU] via SUBCUTANEOUS
  Administered 2019-10-12 – 2019-10-13 (×2): 8 [IU] via SUBCUTANEOUS
  Administered 2019-10-13 – 2019-10-14 (×3): 5 [IU] via SUBCUTANEOUS

## 2019-10-09 MED ORDER — EMPAGLIFLOZIN 25 MG PO TABS
25.0000 mg | ORAL_TABLET | Freq: Every day | ORAL | Status: DC
  Filled 2019-10-09: qty 1

## 2019-10-09 MED ORDER — SODIUM CHLORIDE 0.9 % IV SOLN
100.0000 mg | Freq: Every day | INTRAVENOUS | Status: AC
  Administered 2019-10-10 – 2019-10-13 (×4): 100 mg via INTRAVENOUS
  Filled 2019-10-09 (×5): qty 20

## 2019-10-09 MED ORDER — ASPIRIN EC 81 MG PO TBEC
81.0000 mg | DELAYED_RELEASE_TABLET | Freq: Every day | ORAL | Status: DC
  Administered 2019-10-10 – 2019-10-14 (×5): 81 mg via ORAL
  Filled 2019-10-09 (×5): qty 1

## 2019-10-09 MED ORDER — ENOXAPARIN SODIUM 40 MG/0.4ML ~~LOC~~ SOLN
40.0000 mg | Freq: Every day | SUBCUTANEOUS | Status: DC
  Administered 2019-10-10 – 2019-10-13 (×4): 40 mg via SUBCUTANEOUS
  Filled 2019-10-09 (×4): qty 0.4

## 2019-10-09 MED ORDER — INSULIN ASPART 100 UNIT/ML ~~LOC~~ SOLN
0.0000 [IU] | Freq: Every day | SUBCUTANEOUS | Status: DC
  Administered 2019-10-10 – 2019-10-13 (×3): 2 [IU] via SUBCUTANEOUS

## 2019-10-09 MED ORDER — DEXAMETHASONE SODIUM PHOSPHATE 10 MG/ML IJ SOLN
10.0000 mg | Freq: Once | INTRAMUSCULAR | Status: AC
  Administered 2019-10-09: 10 mg via INTRAVENOUS
  Filled 2019-10-09: qty 1

## 2019-10-09 MED ORDER — ISOSORBIDE MONONITRATE ER 60 MG PO TB24
60.0000 mg | ORAL_TABLET | Freq: Every day | ORAL | Status: DC
  Administered 2019-10-10 – 2019-10-14 (×5): 60 mg via ORAL
  Filled 2019-10-09 (×5): qty 1

## 2019-10-09 MED ORDER — SODIUM CHLORIDE 0.9 % IV SOLN
200.0000 mg | Freq: Once | INTRAVENOUS | Status: AC
  Administered 2019-10-09: 200 mg via INTRAVENOUS
  Filled 2019-10-09: qty 40

## 2019-10-09 MED ORDER — LISINOPRIL 10 MG PO TABS
10.0000 mg | ORAL_TABLET | Freq: Every day | ORAL | Status: DC
  Administered 2019-10-10 – 2019-10-14 (×5): 10 mg via ORAL
  Filled 2019-10-09 (×5): qty 1

## 2019-10-09 MED ORDER — SODIUM CHLORIDE 0.9 % IV SOLN: 250.0000 mL | INTRAVENOUS | Status: DC | PRN

## 2019-10-09 MED ORDER — ACETAMINOPHEN 650 MG RE SUPP: 650.0000 mg | Freq: Four times a day (QID) | RECTAL | Status: DC | PRN

## 2019-10-09 NOTE — ED Notes (Signed)
Pt increased to 4L due to hypoxia

## 2019-10-09 NOTE — ED Provider Notes (Signed)
[facility]MENT Provider Note   CSN: [id number] Arrival date & time: 10/09/19  1755     History Chief Complaint  Patient presents with  . Shortness of Breath    [facility] is a 74 y.o. male.  HPI   This patient is a 75 year old male, known history of diabetes, on Metformin and Jardiance, he has a history of acid reflux hypercholesterol and fatty liver disease.  He presents to the hospital today with a complaint of increasing coughing and shortness of breath which started about a week ago.  His oxygen level was 78% when the paramedics arrived at the house today.  He was very short of breath and required a nonrebreather prehospital.  Frequent coughing, frequent diarrhea and increasing weakness.  Symptoms are persistent, he is not having any swelling of his legs, not having any headache, he does not have a sore throat.  The patient did not have a vaccination for Covid and has tested positive for Covid along with the rest of his family.  According to the paramedics no medications were given prehospital Past Medical History:  Diagnosis Date  . Chronic sinusitis   . Diabetes mellitus without complication ([place])   . GERD (gastroesophageal reflux disease)   . Hepatic steatosis   . Hypercholesterolemia   . Jackhammer esophagus    [facility], botox, dilation, isosorbide    Patient Active Problem List   Diagnosis Date Noted  . Controlled type 2 diabetes mellitus without complication, without long-term current use of insulin ([place]) 09/06/2017  . Jackhammer esophagus   . GERD (gastroesophageal reflux disease) 12/31/2012  . Atypical chest pain 08/28/2010  . Hypercholesteremia 08/28/2010    Past Surgical History:  Procedure Laterality Date  . COLONOSCOPY  2007  . CYSTECTOMY     BENIGN CYST REMOVAL FROM RIGHT SHOULDER  . ESOPHAGEAL MANOMETRY N/A 02/02/2013   Procedure: ESOPHAGEAL MANOMETRY (EM);  Surgeon: Cleotis Nipper, MD;  Location: [facility];   Service: Endoscopy;  Laterality: N/A;  . 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 HAND DEBRIDEMENT  2009   left       Family History  Problem Relation Age of Onset  . Hypertension Mother   . Hepatitis C Sister     Social History   Tobacco Use  . Smoking status: Former Smoker    Types: Cigarettes    Quit date: 11/27/1968    Years since quitting: 50.8  . Smokeless tobacco: Never Used  . Tobacco comment: QUIT 1970  Substance Use Topics  . Alcohol use: No  . Drug use: No    Home Medications Prior to Admission medications   Medication Sig Start Date End Date Taking? Authorizing Provider  aspirin EC 81 MG tablet Take 81 mg by mouth daily.    [provider]  atorvastatin (LIPITOR) 20 MG tablet TAKE 1 TABLET BY MOUTH EVERY DAY 06/05/19   Susy Frizzle, MD  empagliflozin (JARDIANCE) 25 MG TABS tablet Take 1 tablet (25 mg total) by mouth daily before breakfast. 08/26/19   Susy Frizzle, MD  FREESTYLE LITE test strip USE TO TEST BLOOD SUGAR ONCE DAILY 12/09/18   Susy Frizzle, MD  isosorbide mononitrate (IMDUR) 60 MG 24 hr tablet Take 60 mg by mouth daily.  [provider]  Lancets (FREESTYLE) lancets Check BS BID - DX: E11.9 01/20/18   Susy Frizzle, MD  lisinopril (ZESTRIL) 10 MG tablet TAKE 1 TABLET BY MOUTH EVERY DAY 02/25/19   Susy Frizzle, MD  metFORMIN (GLUCOPHAGE-XR) 500 MG 24 hr tablet TAKE 4 TABLETS BY MOUTH EVERY DAY WITH BREAKFAST 09/17/19   Susy Frizzle, MD  nitroGLYCERIN (NITROSTAT) 0.4 MG SL tablet Place 0.4 mg under the tongue every 5 (five) minutes as needed for chest pain.    [provider]    Allergies    Ampicillin  Review of Systems   Review of  Systems  All other systems reviewed and are negative.   Physical Exam Updated Vital Signs There were no vitals taken for this visit.  Physical Exam Vitals and nursing note reviewed.  Constitutional:      General: He is in acute distress.     Appearance: He is well-developed.  HENT:     Head: Normocephalic and atraumatic.     Mouth/Throat:     Pharynx: No oropharyngeal exudate.  Eyes:     General: No scleral icterus.       Right eye: No discharge.        Left eye: No discharge.     Conjunctiva/sclera: Conjunctivae normal.     Pupils: Pupils are equal, round, and reactive to light.  Neck:     Thyroid: No thyromegaly.     Vascular: No JVD.  Cardiovascular:     Rate and Rhythm: Regular rhythm. Tachycardia present.     Heart sounds: Normal heart sounds. No murmur heard.  No friction rub. No gallop.   Pulmonary:     Effort: Tachypnea and respiratory distress present.     Breath sounds: Rhonchi and rales present. No wheezing.  Abdominal:     General: Bowel sounds are normal. There is no distension.     Palpations: Abdomen is soft. There is no mass.     Tenderness: There is no abdominal tenderness.  Musculoskeletal:        General: No tenderness. Normal range of motion.     Cervical back: Normal range of motion and neck supple.  Lymphadenopathy:     Cervical: No cervical adenopathy.  Skin:    General: Skin is warm and dry.     Findings: No erythema or rash.  Neurological:     Mental Status: He is alert.     Coordination: Coordination normal.  Psychiatric:        Behavior: Behavior normal.     ED Results / Procedures / Treatments   Labs (all labs ordered are listed, but only abnormal results are displayed) Labs Reviewed  CBC WITH DIFFERENTIAL/PLATELET - Abnormal; Notable for the following components:      Result Value   WBC 3.6 (*)    Lymphs Abs 0.6 (*)    All other components within normal limits  COMPREHENSIVE METABOLIC PANEL - Abnormal; Notable for the  following components:   CO2 21 (*)    Glucose, Bld 103 (*)    Calcium 8.5 (*)    Albumin 3.0 (*)    AST 65 (*)    ALT 49 (*)    All other components within normal limits  D-DIMER, QUANTITATIVE (NOT AT [facility]) - Abnormal; Notable for the following components:   D-Dimer, Quant 1.00 (*)    All other components within normal limits  LACTATE DEHYDROGENASE - Abnormal; Notable for the following components:   LDH 368 (*)  All other components within normal limits  FERRITIN - Abnormal; Notable for the following components:   Ferritin 1,027 (*)    All other components within normal limits  FIBRINOGEN - Abnormal; Notable for the following components:   Fibrinogen 568 (*)    All other components within normal limits  C-REACTIVE PROTEIN - Abnormal; Notable for the following components:   CRP 8.7 (*)    All other components within normal limits  CULTURE, BLOOD (ROUTINE X 2)  CULTURE, BLOOD (ROUTINE X 2)  LACTIC ACID, PLASMA  TRIGLYCERIDES  LACTIC ACID, PLASMA  PROCALCITONIN    EKG EKG Interpretation  Date/Time:  Friday October 09 2019 18:06:19 EDT Ventricular Rate:  97 PR Interval:    QRS Duration: 77 QT Interval:  329 QTC Calculation: 418 R Axis:   39 Text Interpretation: Sinus rhythm Abnormal R-wave progression, early transition ECG OTHERWISE WITHIN NORMAL LIMITS Confirmed by Noemi Chapel [phone]) on 10/09/2019 6:12:00 PM   Radiology DG Chest Port 1 View  Result Date: 10/09/2019 CLINICAL DATA:  Cough and shortness of breath, COVID-19 positivity EXAM: PORTABLE CHEST 1 VIEW COMPARISON:  10/04/2019 FINDINGS: Cardiac shadow is enlarged. Aortic calcifications are again seen. Lungs are well aerated but now demonstrate diffuse increased airspace opacities bilaterally consistent with the given clinical history. No bony abnormality is noted. IMPRESSION: Diffuse increase in airspace opacities bilaterally consistent with the given clinical history of COVID-19 positivity. Electronically Signed    By: Inez Catalina M.D.   On: 10/09/2019 19:06    Procedures .Critical Care Performed by: Noemi Chapel, MD Authorized by: Noemi Chapel, MD   Critical care provider statement:    Critical care time (minutes):  35   Critical care time was exclusive of:  Separately billable procedures and treating other patients and teaching time   Critical care was necessary to treat or prevent imminent or life-threatening deterioration of the following conditions:  Respiratory failure   Critical care was time spent personally by me on the following activities:  Blood draw for specimens, development of treatment plan with patient or surrogate, discussions with consultants, evaluation of patient's response to treatment, examination of patient, obtaining history from patient or surrogate, ordering and performing treatments and interventions, ordering and review of laboratory studies, ordering and review of radiographic studies, pulse oximetry, re-evaluation of patient's condition and review of old charts   (including critical care time)  Medications Ordered in ED Medications - No data to display  ED Course  I have reviewed the triage vital signs and the nursing notes.  Pertinent labs & imaging results that were available during my care of the patient were reviewed by me and considered in my medical decision making (see chart for details).  Clinical Course as of Oct 09 1943  Fri Oct 09, 2019  1919 I have personally viewed the anterior posterior x-ray which shows diffuse bilateral infiltrates in a viral or atypical pattern.  There is what appears to be some cardiomegaly but the patient does have some scoliosis.  No signs of pneumothorax or subdiaphragmatic air, skin and soft tissues otherwise appear unremarkable.   [BM]  1919 Laboratory work-up shows a slight leukopenia, elevated D-dimer and fibrinogen, all this is consistent with the patient's inflammatory Covid pneumonia.   [BM]  1919 Decadron added, will  admit to the hospitalist service   [BM]    Clinical Course User Index [BM] Noemi Chapel, MD   MDM Rules/Calculators/A&P  This patient is a ill-appearing 74 year old male presenting with Covid respiratory distress.  I suspect he has Covid pneumonia.  He is a diabetic, he has not been on any specific medications for his symptoms this week other than some over-the-counter supportive medications.  At this time the patient will need to have further evaluation with a chest x-ray and labs, he will likely need to be admitted to the hospital due to his hypoxia.  He is no longer in the severe distress that he was in because of the supplemental oxygen he was given however when I take him off of the oxygen he becomes more tachypneic and symptomatic.  I suspect you will need to be admitted to the hospital for acute hypoxic respiratory failure.  His EKG shows heart rate of 97 with normal axis intervals and ST segments, no signs of acute ischemia.  Lab work pending, chest x-ray pending.  Discussed the case with hospitalist who is been kind enough to admit this patient to the hospital  [facility] was evaluated in Emergency Department on 10/09/2019 for the symptoms described in the history of present illness. He was evaluated in the context of the global COVID-19 pandemic, which necessitated consideration that the patient might be at risk for infection with the SARS-CoV-2 virus that causes COVID-19. Institutional protocols and algorithms that pertain to the evaluation of patients at risk for COVID-19 are in a state of rapid change based on information released by regulatory bodies including the CDC and federal and state organizations. These policies and algorithms were followed during the patient's care in the ED.   Final Clinical Impression(s) / ED Diagnoses Final diagnoses:  Acute respiratory failure with hypoxia ([place])      Noemi Chapel, MD 10/09/19 1946

## 2019-10-09 NOTE — H&P (Signed)
PCP:   Susy Frizzle, MD   Chief Complaint:  Shortness of breath  HPI: This is a 74 year old male who was diagnosed with Covid approximately a week ago.  He was brought in because of worsening shortness of breath.  The patient dyspneic.  He does not appear confused but is unable to give much history except that he is short of breath.  He has a cough that appears dry.  In the ER he is hypoxemic.  Currently he is on 6 L satting 91%.  The patient is unvaccinated.  There is report of diarrhea.   Review of Systems:  The patient denies anorexia, fever, weight loss,, vision loss, decreased hearing, hoarseness, chest pain, syncope, dyspnea on exertion, peripheral edema, balance deficits, hemoptysis, abdominal pain, melena, hematochezia, severe indigestion/heartburn, hematuria, incontinence, genital sores, muscle weakness, suspicious skin lesions, transient blindness, difficulty walking, depression, unusual weight change, abnormal bleeding, enlarged lymph nodes, angioedema, and breast masses. Positives: Shortness of breath, diarrhea, weakness  Past Medical History: Past Medical History:  Diagnosis Date  . Chronic sinusitis   . Diabetes mellitus without complication ([place])   . GERD (gastroesophageal reflux disease)   . Hepatic steatosis   . Hypercholesterolemia   . Jackhammer esophagus    [facility], botox, dilation, isosorbide   Past Surgical History:  Procedure Laterality Date  . COLONOSCOPY  2007  . CYSTECTOMY     BENIGN CYST REMOVAL FROM RIGHT SHOULDER  . ESOPHAGEAL MANOMETRY N/A 02/02/2013   Procedure: ESOPHAGEAL MANOMETRY (EM);  Surgeon: Cleotis Nipper, MD;  Location: [facility];  Service: Endoscopy;  Laterality: N/A;  . 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 HAND DEBRIDEMENT  2009   left    Medications: Prior to Admission medications   Medication Sig Start Date End Date Taking? Authorizing Provider  aspirin EC 81 MG tablet Take 81 mg by mouth daily.    [provider]  atorvastatin (LIPITOR) 20 MG tablet TAKE 1 TABLET BY MOUTH EVERY DAY 06/05/19   Susy Frizzle, MD  empagliflozin (JARDIANCE) 25 MG TABS tablet Take 1 tablet (25 mg total) by mouth daily before breakfast. 08/26/19   Susy Frizzle, MD  FREESTYLE LITE test strip USE TO TEST BLOOD SUGAR ONCE DAILY 12/09/18   Susy Frizzle, MD  isosorbide mononitrate (IMDUR) 60 MG 24 hr tablet Take 60 mg by mouth daily.    [provider]  Lancets (FREESTYLE) lancets Check BS BID - DX: E11.9 01/20/18   Susy Frizzle, MD  lisinopril (ZESTRIL) 10 MG tablet TAKE 1 TABLET BY MOUTH EVERY DAY 02/25/19   Susy Frizzle, MD  metFORMIN (GLUCOPHAGE-XR) 500 MG 24 hr tablet TAKE 4 TABLETS BY MOUTH EVERY DAY WITH BREAKFAST 09/17/19   Susy Frizzle, MD  nitroGLYCERIN (NITROSTAT) 0.4 MG SL tablet Place 0.4 mg under the tongue every 5 (five) minutes as needed for chest pain.    [provider]    Allergies:   Allergies  Allergen Reactions  . Ampicillin Rash    Social History:  reports that he quit smoking about 50 years ago. His smoking use included cigarettes. He has never used smokeless tobacco. He reports that he does not drink alcohol and does not use drugs.  Family History: Family History  Problem Relation  Age of Onset  . Hypertension Mother   . Hepatitis C Sister     Physical Exam: Vitals:   10/09/19 1811  BP: 125/77  Pulse: 92  Resp: 20  Temp: 98.8 F (37.1 C)  TempSrc: Oral  SpO2: 93%    General:  Alert and oriented times three, obese, dyspneic Eyes: PERRLA, pink conjunctiva, no scleral icterus ENT: Moist oral mucosa, neck supple, no thyromegaly Lungs: clear to ascultation, no wheeze, no crackles,  no use of accessory muscles Cardiovascular: regular rate and rhythm, no regurgitation, no gallops, no murmurs. No carotid bruits, no JVD Abdomen: soft, positive BS, non-tender, non-distended, no organomegaly, not an acute abdomen GU: not examined Neuro: CN II - XII grossly intact, sensation intact Musculoskeletal: strength 5/5 all extremities, no clubbing, cyanosis or edema Skin: no rash, no subcutaneous crepitation, no decubitus Psych:    Labs on Admission:  Recent Labs    10/09/19 1837  NA 135  K 3.7  CL 102  CO2 21*  GLUCOSE 103*  BUN 21  CREATININE 1.05  CALCIUM 8.5*   Recent Labs    10/09/19 1837  AST 65*  ALT 49*  ALKPHOS 45  BILITOT 1.0  PROT 6.5  ALBUMIN 3.0*   No results for input(s): LIPASE, AMYLASE in the last 72 hours. Recent Labs    10/09/19 1837  WBC 3.6*  NEUTROABS 2.8  HGB 14.3  HCT 43.7  MCV 91.8  PLT 192   No results for input(s): CKTOTAL, CKMB, CKMBINDEX, TROPONINI in the last 72 hours. Invalid input(s): [place]    10/09/19 1837  DDIMER 1.00*   No results for input(s): HGBA1C in the last 72 hours. Recent Labs    10/09/19 1837  TRIG 66   No results for input(s): TSH, T4TOTAL, T3FREE, THYROIDAB in the last 72 hours.  Invalid input(s): FREET3 Recent Labs    10/09/19 1837  FERRITIN 1,027*    Micro Results: No results found for this or any previous visit (from the past 240 hour(s)).   Radiological Exams on Admission: DG Chest Port 1 View  Result Date: 10/09/2019 CLINICAL DATA:  Cough and shortness of breath, COVID-19 positivity EXAM: PORTABLE CHEST 1 VIEW COMPARISON:  10/04/2019 FINDINGS: Cardiac shadow is enlarged. Aortic calcifications are again seen. Lungs are well aerated but now demonstrate diffuse increased airspace opacities bilaterally consistent with the given clinical history. No bony abnormality is noted. IMPRESSION: Diffuse increase in airspace opacities bilaterally consistent with the given clinical history  of COVID-19 positivity. Electronically Signed   By: Inez Catalina M.D.   On: 10/09/2019 19:06    Assessment/Plan Present on Admission: . Acute respiratory failure due to COVID-19 [facility]) -Admit to progressive care -IV Decadron and remdesivir ordered.  Pharmacy to dose remdesivir -DuoNebs as needed, respiratory evaluation and treat -Oxygen to keep sats greater than 88% -Inflammatory markers -Patient appears at a high risk of decompensation -IV fluid hydration  Diabetes mellitus type 2 -Glucose surveillance -Continue home dose of Jardiance.  Metformin held  . Hypercholesteremia -Stable, home meds were held  . Jackhammer esophagus -  Janya Eveland 10/09/2019, 8:14 PM

## 2019-10-09 NOTE — ED Triage Notes (Signed)
Pt here from home for eval of shortness of breath and productive cough secondary to covid, which he was dx with on Sunday, along with all of his family. Today pt developed difficulty breathing and cough continues. 78% SpO2 on room air.

## 2019-10-09 NOTE — ED Notes (Signed)
Please call [name] (daughter) [phone] for an update on pt

## 2019-10-10 ENCOUNTER — Encounter (HOSPITAL_COMMUNITY): Payer: Self-pay | Admitting: Family Medicine

## 2019-10-10 DIAGNOSIS — J9600 Acute respiratory failure, unspecified whether with hypoxia or hypercapnia: Secondary | ICD-10-CM

## 2019-10-10 DIAGNOSIS — I10 Essential (primary) hypertension: Secondary | ICD-10-CM

## 2019-10-10 LAB — COMPREHENSIVE METABOLIC PANEL
ALT: 49 U/L — ABNORMAL HIGH (ref 0–44)
AST: 67 U/L — ABNORMAL HIGH (ref 15–41)
Albumin: 2.5 g/dL — ABNORMAL LOW (ref 3.5–5.0)
Alkaline Phosphatase: 47 U/L (ref 38–126)
Anion gap: 12 (ref 5–15)
BUN: 18 mg/dL (ref 8–23)
CO2: 22 mmol/L (ref 22–32)
Calcium: 8.3 mg/dL — ABNORMAL LOW (ref 8.9–10.3)
Chloride: 104 mmol/L (ref 98–111)
Creatinine, Ser: 0.81 mg/dL (ref 0.61–1.24)
GFR calc Af Amer: >60 mL/min (ref >60)
GFR calc non Af Amer: >60 mL/min (ref >60)
Glucose, Bld: 160 mg/dL — ABNORMAL HIGH (ref 70–99)
Potassium: 4.2 mmol/L (ref 3.5–5.1)
Sodium: 138 mmol/L (ref 135–145)
Total Bilirubin: 0.7 mg/dL (ref 0.3–1.2)
Total Protein: 5.9 g/dL — ABNORMAL LOW (ref 6.5–8.1)

## 2019-10-10 LAB — MAGNESIUM: Magnesium: 2.1 mg/dL (ref 1.7–2.4)

## 2019-10-10 LAB — CREATININE, SERUM
Creatinine, Ser: 0.94 mg/dL (ref 0.61–1.24)
GFR calc Af Amer: >60 mL/min (ref >60)
GFR calc non Af Amer: >60 mL/min (ref >60)

## 2019-10-10 LAB — CBC
HCT: 41 % (ref 39.0–52.0)
HCT: 42.5 % (ref 39.0–52.0)
Hemoglobin: 13.7 g/dL (ref 13.0–17.0)
Hemoglobin: 14 g/dL (ref 13.0–17.0)
MCH: 29.9 pg (ref 26.0–34.0)
MCH: 30.2 pg (ref 26.0–34.0)
MCHC: 32.9 g/dL (ref 30.0–36.0)
MCHC: 33.4 g/dL (ref 30.0–36.0)
MCV: 90.5 fL (ref 80.0–100.0)
MCV: 90.6 fL (ref 80.0–100.0)
Platelets: 197 10*3/uL (ref 150–400)
Platelets: 208 10*3/uL (ref 150–400)
RBC: 4.53 MIL/uL (ref 4.22–5.81)
RBC: 4.69 MIL/uL (ref 4.22–5.81)
RDW: 13.9 % (ref 11.5–15.5)
RDW: 14 % (ref 11.5–15.5)
WBC: 2.3 10*3/uL — ABNORMAL LOW (ref 4.0–10.5)
WBC: 4.4 10*3/uL (ref 4.0–10.5)
nRBC: 0 % (ref 0.0–0.2)
nRBC: 0 % (ref 0.0–0.2)

## 2019-10-10 LAB — GLUCOSE, CAPILLARY
Glucose-Capillary: 143 mg/dL — ABNORMAL HIGH (ref 70–99)
Glucose-Capillary: 142 mg/dL — ABNORMAL HIGH (ref 70–99)
Glucose-Capillary: 192 mg/dL — ABNORMAL HIGH (ref 70–99)
Glucose-Capillary: 220 mg/dL — ABNORMAL HIGH (ref 70–99)

## 2019-10-10 LAB — CBG MONITORING, ED: Glucose-Capillary: 130 mg/dL — ABNORMAL HIGH (ref 70–99)

## 2019-10-10 LAB — D-DIMER, QUANTITATIVE: D-Dimer, Quant: 1 ug/mL-FEU — ABNORMAL HIGH (ref 0.00–0.50)

## 2019-10-10 LAB — PROTIME-INR
INR: 1.1 (ref 0.8–1.2)
Prothrombin Time: 13.3 seconds (ref 11.4–15.2)

## 2019-10-10 LAB — C-REACTIVE PROTEIN: CRP: 9.9 mg/dL — ABNORMAL HIGH (ref <1.0)

## 2019-10-10 LAB — LACTIC ACID, PLASMA: Lactic Acid, Venous: 1.2 mmol/L (ref 0.5–1.9)

## 2019-10-10 LAB — FERRITIN: Ferritin: 790 ng/mL — ABNORMAL HIGH (ref 24–336)

## 2019-10-10 MED ORDER — PANTOPRAZOLE SODIUM 40 MG PO TBEC
40.0000 mg | DELAYED_RELEASE_TABLET | Freq: Every day | ORAL | Status: DC
  Administered 2019-10-11 – 2019-10-14 (×4): 40 mg via ORAL
  Filled 2019-10-10 (×4): qty 1

## 2019-10-10 MED ORDER — METHYLPREDNISOLONE SODIUM SUCC 125 MG IJ SOLR
60.0000 mg | Freq: Two times a day (BID) | INTRAMUSCULAR | Status: DC
  Administered 2019-10-10 – 2019-10-14 (×9): 60 mg via INTRAVENOUS
  Filled 2019-10-10 (×10): qty 2

## 2019-10-10 MED ORDER — ORAL CARE MOUTH RINSE
15.0000 mL | Freq: Two times a day (BID) | OROMUCOSAL | Status: DC
  Administered 2019-10-10 – 2019-10-13 (×7): 15 mL via OROMUCOSAL

## 2019-10-10 MED ORDER — BARICITINIB 2 MG PO TABS
4.0000 mg | ORAL_TABLET | Freq: Every day | ORAL | Status: DC
  Administered 2019-10-10 – 2019-10-14 (×5): 4 mg via ORAL
  Filled 2019-10-10 (×5): qty 2

## 2019-10-10 NOTE — Plan of Care (Signed)
  Problem: Education: Goal: Knowledge of risk factors and measures for prevention of condition will improve Outcome: Progressing   Problem: Coping: Goal: Psychosocial and spiritual needs will be supported Outcome: Progressing   Problem: Respiratory: Goal: Will maintain a patent airway Outcome: Progressing Goal: Complications related to the disease process, condition or treatment will be avoided or minimized Outcome: Progressing   

## 2019-10-10 NOTE — Progress Notes (Addendum)
PROGRESS NOTE                                                                                                                                                                                                             Patient Demographics:    Patrick Luna, is a 74 y.o. male, DOB - 12-03-1945, FYB:[id number]  Outpatient Primary MD for the patient is Susy Frizzle, MD   Admit date - 10/09/2019   LOS - 1  Chief Complaint  Patient presents with  . Shortness of Breath       Brief Narrative: Patient is a 74 y.o. male with PMHx of HTN, HLD, DM-2-who apparently was diagnosed with COVID-19 approximately 8-9 days prior to this hospitalization-presented to the ED on 8/27 with shortness of breath-found to have acute hypoxic respiratory failure secondary to COVID-19 pneumonia.  COVID-19 vaccinated status: Unvaccinated  Significant Events: 8/22>> ED visit-known Covid +ve-presented with weakness-not hypoxic-no pneumonia on x-ray.  Discharged home. 8/27>> Admit to [facility] for hypoxia due to COVID-19 pneumonia.  Significant studies: 8/22>> chest x-ray: No acute cardiopulmonary disease. 8/27>>Chest x-ray: Views increasing airspace opacities bilaterally consistent with Covid pneumonia.  COVID-19 medications: Steroids: 8/27>> Remdesivir: 8/27>> Baricitinib:  Antibiotics: None  Microbiology data: 8/27>> blood culture: No growth  Procedures: None  Consults: None  DVT prophylaxis: enoxaparin (LOVENOX) injection 40 mg Start: 10/10/19 1000 SCDs Start: 10/09/19 2327    Subjective:    Charna Archer today feels essentially the same-cough but otherwise stable at rest.  On 5 L of oxygen when I first saw him-titrated down to 3 L with O2 saturation in the 90s.   Assessment  & Plan :   Acute Hypoxic Resp Failure due to Covid 19 Viral pneumonia: Has moderate hypoxemia-titrated down to 3 L of oxygen this morning.  Looks  comfortable-change Decadron to Solu-Medrol, continue Remdesivir.  RN aware that if hypoxemia worsens or if O2 requirement approaches 5 L-she is to let me know.    Consent for baricitinib taken-he understands rationale/risk/benefits of baricitinib-he has no history of TB-and consents to the use of baricitinib.   Addendum: FiO2 back up to 5 L-we will go and start baricitinib.  Fever: afebrile O2 requirements:  SpO2: 92 % O2 Flow Rate (L/min): 5 L/min   COVID-19 Labs: Recent Labs    10/09/19 1837 10/10/19 0729 10/10/19 0740  DDIMER 1.00* 1.00*  --   FERRITIN 1,027* 790*  --   LDH 368*  --   --   CRP 8.7*  --  9.9*       Component Value Date/Time   BNP 11.3 08/28/2010 0000    Recent Labs  Lab 10/09/19 1837  PROCALCITON <0.10    No results found for: SARSCOV2NAA    Prone/Incentive Spirometry: encouraged incentive spirometry use 3-4/hour.  Leukopenia: Secondary to COVID-19 infection-should improve with time.  Transaminitis: Mild-likely secondary to COVID-19-stable for follow-up-no need for further work-up at this time.  HTN: BP controlled-continue Imdur, lisinopril  DM-2 (A1c 7.7 on 08/21/2019): CBG stable-continue SSI-continue to hold all oral hypoglycemic agents.  Recent Labs    10/09/19 2349 10/10/19 0744 10/10/19 1211  GLUCAP 130* 143* 142*   Obesity: Estimated body mass index is 33.27 kg/m as calculated from the following:   Height as of this encounter: 5\' 6"  (1.676 m).   Weight as of this encounter: 93.5 kg.   Vent Settings: N/A  Condition - Guarded  Family Communication  :  Spouse Sunday Spillers [phone])-called on 8/28-mailbox full-unable to leave voicemail.  Code Status :  Full Code  Diet :  Diet Order            Diet heart healthy/carb modified Room service appropriate? Yes; Fluid consistency: Thin  Diet effective now                  Disposition Plan  :   Status is: Inpatient  Remains inpatient appropriate because:Inpatient level of  care appropriate due to severity of illness   Dispo: The patient is from: Home              Anticipated d/c is to: Home              Anticipated d/c date is: > 3 days              Patient currently is not medically stable to d/c.   Barriers to discharge: Hypoxia requiring O2 supplementation/complete 5 days of IV Remdesivir  Antimicorbials  :    Anti-infectives (From admission, onward)   Start     Dose/Rate Route Frequency Ordered Stop   10/10/19 1000  remdesivir 100 mg in sodium chloride 0.9 % 100 mL IVPB       "Followed by" Linked Group Details   100 mg 200 mL/hr over 30 Minutes Intravenous Daily 10/09/19 2100 10/14/19 0959   10/09/19 2130  remdesivir 200 mg in sodium chloride 0.9% 250 mL IVPB       "Followed by" Linked Group Details   200 mg 580 mL/hr over 30 Minutes Intravenous Once 10/09/19 2100 10/10/19 0132      Inpatient Medications  Scheduled Meds: . aspirin EC  81 mg Oral Daily  . enoxaparin (LOVENOX) injection  40 mg Subcutaneous Daily  . insulin aspart  0-15 Units Subcutaneous TID WC  . insulin aspart  0-5 Units Subcutaneous QHS  . isosorbide mononitrate  60 mg Oral Daily  . lisinopril  10 mg Oral Daily  . mouth rinse  15 mL Mouth Rinse BID  . methylPREDNISolone (SOLU-MEDROL) injection  60 mg Intravenous BID  . sodium chloride flush  3 mL Intravenous Q12H   Continuous Infusions: . sodium chloride    . 0.9 % NaCl with KCl 20 mEq / L 75 mL/hr at 10/09/19 2353  . remdesivir 100 mg in NS 100 mL 100 mg (10/10/19 1022)   PRN Meds:.sodium  chloride, acetaminophen **OR** acetaminophen, hydrALAZINE, ondansetron **OR** ondansetron (ZOFRAN) IV, sodium chloride flush   Time Spent in minutes  25 See all Orders from today for further details   Oren Binet M.D on 10/10/2019 at 2:58 PM  To page go to [email or web address] - use universal password  [facility] -  Office  [phone]    Objective:   Vitals:   10/10/19 0615 10/10/19 0740 10/10/19 1207 10/10/19  1216  BP:  110/68 95/62   Pulse: 83 91 77   Resp: 20 20 (!) 21 20  Temp:  98.4 F (36.9 C) 98.2 F (36.8 C)   TempSrc:  Oral Oral   SpO2: 91% 93% 92%   Weight:      Height:        Wt Readings from Last 3 Encounters:  10/10/19 93.5 kg  10/05/19 95.3 kg  04/07/19 101.2 kg     Intake/Output Summary (Last 24 hours) at 10/10/2019 1458 Last data filed at 10/10/2019 1300 Gross per 24 hour  Intake 1168.78 ml  Output 650 ml  Net 518.78 ml     Physical Exam Gen Exam:Alert awake-not in any distress HEENT:atraumatic, normocephalic Chest: B/L clear to auscultation anteriorly CVS:S1S2 regular Abdomen:soft non tender, non distended Extremities:no edema Neurology: Non focal Skin: no rash   Data Review:    CBC Recent Labs  Lab 10/04/19 1256 10/09/19 1837 10/09/19 2350 10/10/19 0729  WBC 3.7* 3.6* 4.4 2.3*  HGB 14.6 14.3 14.0 13.7  HCT 45.1 43.7 42.5 41.0  PLT 133* 192 208 197  MCV 92.4 91.8 90.6 90.5  MCH 29.9 30.0 29.9 30.2  MCHC 32.4 32.7 32.9 33.4  RDW 14.2 14.1 14.0 13.9  LYMPHSABS 0.6* 0.6*  --   --   MONOABS 0.3 0.1  --   --   EOSABS 0.0 0.0  --   --   BASOSABS 0.0 0.0  --   --     Chemistries  Recent Labs  Lab 10/04/19 1256 10/09/19 1837 10/09/19 2350 10/10/19 0729  NA 133* 135  --  138  K 4.0 3.7  --  4.2  CL 101 102  --  104  CO2 21* 21*  --  22  GLUCOSE 180* 103*  --  160*  BUN 14 21  --  18  CREATININE 1.04 1.05 0.94 0.81  CALCIUM 8.5* 8.5*  --  8.3*  MG  --   --   --  2.1  AST 60* 65*  --  67*  ALT 56* 49*  --  49*  ALKPHOS 48 45  --  47  BILITOT 0.6 1.0  --  0.7   ------------------------------------------------------------------------------------------------------------------ Recent Labs    10/09/19 1837  TRIG 66    Lab Results  Component Value Date   HGBA1C 7.7 (H) 08/21/2019   ------------------------------------------------------------------------------------------------------------------ No results for input(s): TSH,  T4TOTAL, T3FREE, THYROIDAB in the last 72 hours.  Invalid input(s): FREET3 ------------------------------------------------------------------------------------------------------------------ Recent Labs    10/09/19 1837 10/10/19 0729  FERRITIN 1,027* 790*    Coagulation profile Recent Labs  Lab 10/10/19 0729  INR 1.1    Recent Labs    10/09/19 1837 10/10/19 0729  DDIMER 1.00* 1.00*    Cardiac Enzymes No results for input(s): CKMB, TROPONINI, MYOGLOBIN in the last 168 hours.  Invalid input(s): CK ------------------------------------------------------------------------------------------------------------------    Component Value Date/Time   BNP 11.3 08/28/2010 0000    Micro Results Recent Results (from the past 240 hour(s))  Blood Culture (routine x 2)  Status: None (Preliminary result)   Collection Time: 10/09/19  6:38 PM   Specimen: BLOOD  Result Value Ref Range Status   Specimen Description BLOOD RIGHT ANTECUBITAL  Final   Special Requests   Final    BOTTLES DRAWN AEROBIC AND ANAEROBIC Blood Culture adequate volume   Culture   Final    NO GROWTH < 24 HOURS Performed at [facility], [address], [place], [place] [address]    Report Status PENDING  Incomplete  Blood Culture (routine x 2)     Status: None (Preliminary result)   Collection Time: 10/09/19  7:15 PM   Specimen: BLOOD  Result Value Ref Range Status   Specimen Description BLOOD LEFT ANTECUBITAL  Final   Special Requests   Final    BOTTLES DRAWN AEROBIC AND ANAEROBIC Blood Culture results may not be optimal due to an inadequate volume of blood received in culture bottles   Culture   Final    NO GROWTH < 24 HOURS Performed at [facility], [place] [address] [place], [place] [address]    Report Status PENDING  Incomplete    Radiology Reports DG Chest Port 1 View  Result Date: 10/09/2019 CLINICAL DATA:  Cough and shortness of breath, COVID-19 positivity EXAM: PORTABLE CHEST 1 VIEW  COMPARISON:  10/04/2019 FINDINGS: Cardiac shadow is enlarged. Aortic calcifications are again seen. Lungs are well aerated but now demonstrate diffuse increased airspace opacities bilaterally consistent with the given clinical history. No bony abnormality is noted. IMPRESSION: Diffuse increase in airspace opacities bilaterally consistent with the given clinical history of COVID-19 positivity. Electronically Signed   By: Inez Catalina M.D.   On: 10/09/2019 19:06   DG Chest Portable 1 View  Result Date: 10/04/2019 CLINICAL DATA:  COVID, shortness of breath EXAM: PORTABLE CHEST 1 VIEW COMPARISON:  Chest x-ray from 2012 FINDINGS: Image rotated to the LEFT. Accounting for this cardiomediastinal contours and hilar structures are normal. Lungs are clear.  No evidence of effusion on frontal view. On limited assessment skeletal structures are unremarkable. IMPRESSION: No acute cardiopulmonary disease. Electronically Signed   By: Zetta Bills M.D.   On: 10/04/2019 13:46

## 2019-10-11 LAB — COMPREHENSIVE METABOLIC PANEL
ALT: 45 U/L — ABNORMAL HIGH (ref 0–44)
AST: 55 U/L — ABNORMAL HIGH (ref 15–41)
Albumin: 2.4 g/dL — ABNORMAL LOW (ref 3.5–5.0)
Alkaline Phosphatase: 35 U/L — ABNORMAL LOW (ref 38–126)
Anion gap: 10 (ref 5–15)
BUN: 28 mg/dL — ABNORMAL HIGH (ref 8–23)
CO2: 24 mmol/L (ref 22–32)
Calcium: 8 mg/dL — ABNORMAL LOW (ref 8.9–10.3)
Chloride: 101 mmol/L (ref 98–111)
Creatinine, Ser: 0.9 mg/dL (ref 0.61–1.24)
GFR calc Af Amer: >60 mL/min (ref >60)
GFR calc non Af Amer: >60 mL/min (ref >60)
Glucose, Bld: 185 mg/dL — ABNORMAL HIGH (ref 70–99)
Potassium: 4.1 mmol/L (ref 3.5–5.1)
Sodium: 135 mmol/L (ref 135–145)
Total Bilirubin: 0.7 mg/dL (ref 0.3–1.2)
Total Protein: 5.3 g/dL — ABNORMAL LOW (ref 6.5–8.1)

## 2019-10-11 LAB — GLUCOSE, CAPILLARY
Glucose-Capillary: 189 mg/dL — ABNORMAL HIGH (ref 70–99)
Glucose-Capillary: 258 mg/dL — ABNORMAL HIGH (ref 70–99)
Glucose-Capillary: 218 mg/dL — ABNORMAL HIGH (ref 70–99)
Glucose-Capillary: 243 mg/dL — ABNORMAL HIGH (ref 70–99)
Glucose-Capillary: 228 mg/dL — ABNORMAL HIGH (ref 70–99)

## 2019-10-11 LAB — C-REACTIVE PROTEIN: CRP: 6.3 mg/dL — ABNORMAL HIGH (ref <1.0)

## 2019-10-11 LAB — CBC
HCT: 36.3 % — ABNORMAL LOW (ref 39.0–52.0)
Hemoglobin: 11.9 g/dL — ABNORMAL LOW (ref 13.0–17.0)
MCH: 29.4 pg (ref 26.0–34.0)
MCHC: 32.8 g/dL (ref 30.0–36.0)
MCV: 89.6 fL (ref 80.0–100.0)
Platelets: 241 10*3/uL (ref 150–400)
RBC: 4.05 MIL/uL — ABNORMAL LOW (ref 4.22–5.81)
RDW: 13.7 % (ref 11.5–15.5)
WBC: 4.2 10*3/uL (ref 4.0–10.5)
nRBC: 0 % (ref 0.0–0.2)

## 2019-10-11 LAB — FERRITIN: Ferritin: 710 ng/mL — ABNORMAL HIGH (ref 24–336)

## 2019-10-11 LAB — D-DIMER, QUANTITATIVE: D-Dimer, Quant: 0.68 ug/mL-FEU — ABNORMAL HIGH (ref 0.00–0.50)

## 2019-10-11 LAB — BRAIN NATRIURETIC PEPTIDE: B Natriuretic Peptide: 36.7 pg/mL (ref 0.0–100.0)

## 2019-10-11 NOTE — Evaluation (Signed)
Physical Therapy Evaluation Patient Details Name: Patrick Luna MRN: [id number] DOB: 1945/03/07 Today's Date: 10/11/2019   History of Present Illness  Pt is a 74 y.o. M with significant PMH of HTN, HLD, DM2, diagnosed with COVID-19 8-9 days prior to this hospitalization who presents 8/27 with SOB and found to have acute hypoxic respiratory failure secondary to COVID-19 pneumonia. Pt is unvaccinated.  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 Pt presents with decreased cardiopulmonary endurance in setting of COVID-19 infection, but overall is moving fairly well. Ambulating 400 feet on 6L O2, SpO2 90-94%, HR 74-97 bpm. BP fairly low (prior to mobility 93/55, post mobility 98/57), but pt asymptomatic. Suspect steady progress. Education provided regarding activity progression/recommendations, incentive spirometer use, monitoring SpO2. Will continue to follow acutely to promote mobility.     Follow Up Recommendations No PT follow up    Equipment Recommendations  None recommended by PT    Recommendations for Other Services       Precautions / Restrictions Precautions Precautions: None Restrictions Weight Bearing Restrictions: No      Mobility  Bed Mobility               General bed mobility comments: OOB in chair  Transfers Overall transfer level: Independent Equipment used: None                Ambulation/Gait Ambulation/Gait assistance: Modified independent (Device/Increase time) ((increased time)) Gait Distance (Feet): 400 Feet Assistive device: None Gait Pattern/deviations: Step-through pattern;Decreased stride length Gait velocity: decreased   General Gait Details: Slow and steady pace, no gross imbalance  Stairs            Wheelchair Mobility    Modified Rankin (Stroke Patients Only)       Balance Overall balance assessment: No apparent balance deficits (not formally assessed)                                            Pertinent Vitals/Pain Pain Assessment: No/denies pain    Home Living Family/patient expects to be discharged to:: Private residence Living Arrangements: Spouse/significant other Available Help at Discharge: Family Type of Home: House Home Access: Stairs to enter   Technical brewer of Steps: 3 Home Layout: One level Home Equipment: None      Prior Function Level of Independence: Independent         Comments: 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 Pt and his wife both have COVID; pt wife is at home recovering.     Hand Dominance        Extremity/Trunk Assessment   Upper Extremity Assessment Upper Extremity Assessment: Overall WFL for tasks assessed    Lower Extremity Assessment Lower Extremity Assessment: Overall WFL for tasks assessed    Cervical / Trunk Assessment Cervical / Trunk Assessment: Normal  Communication   Communication: HOH  Cognition Arousal/Alertness: Awake/alert Behavior During Therapy: WFL for tasks assessed/performed Overall Cognitive Status: Within Functional Limits for tasks assessed                                        General Comments      Exercises Other Exercises Other Exercises: Incentive spirometer x 5   Assessment/Plan  PT Assessment Patient needs continued PT services  PT Problem List Decreased activity tolerance;Decreased mobility;Cardiopulmonary status limiting activity       PT Treatment Interventions Gait training;Functional mobility training;Therapeutic activities;Therapeutic exercise;Balance training;Patient/family education    PT Goals (Current goals can be found in the Care Plan section)  Acute Rehab PT Goals Patient Stated Goal: return to independence PT Goal Formulation: With patient Time For Goal Achievement: 10/25/19 Potential to Achieve Goals: Good    Frequency Min 3X/week   Barriers to  discharge        Co-evaluation               AM-PAC PT "6 Clicks" Mobility  Outcome Measure Help needed turning from your back to your side while in a flat bed without using bedrails?: None Help needed moving from lying on your back to sitting on the side of a flat bed without using bedrails?: None Help needed moving to and from a bed to a chair (including a wheelchair)?: None Help needed standing up from a chair using your arms (e.g., wheelchair or bedside chair)?: None Help needed to walk in hospital room?: None Help needed climbing 3-5 steps with a railing? : A Little 6 Click Score: 23    End of Session Equipment Utilized During Treatment: Oxygen Activity Tolerance: Patient tolerated treatment well Patient left: in chair;with call bell/phone within reach Nurse Communication: Mobility status PT Visit Diagnosis: Difficulty in walking, not elsewhere classified (R26.2)    Time: 1410-1430 PT Time Calculation (min) (ACUTE ONLY): 20 min   Charges:   PT Evaluation $PT Eval Moderate Complexity: 1 Mod            Wyona Almas, PT, DPT Acute Rehabilitation Services Pager [phone] Office [phone]   Deno Etienne 10/11/2019, 4:33 PM

## 2019-10-11 NOTE — Progress Notes (Signed)
PROGRESS NOTE                                                                                                                                                                                                             Patient Demographics:    Patrick Luna, is a 74 y.o. male, DOB - April 14, 1945, [id number]  Outpatient Primary MD for the patient is Susy Frizzle, MD   Admit date - 10/09/2019   LOS - 2  Chief Complaint  Patient presents with  . Shortness of Breath       Brief Narrative: Patient is a 74 y.o. male with PMHx of HTN, HLD, DM-2-who apparently was diagnosed with COVID-19 approximately 8-9 days prior to this hospitalization-presented to the ED on 8/27 with shortness of breath-found to have acute hypoxic respiratory failure secondary to COVID-19 pneumonia.  COVID-19 vaccinated status: Unvaccinated  Significant Events: 8/22>> ED visit-known Covid +ve-presented with weakness-not hypoxic-no pneumonia on x-ray.  Discharged home. 8/27>> Admit to [facility] for hypoxia due to COVID-19 pneumonia.  Significant studies: 8/22>> chest x-ray: No acute cardiopulmonary disease. 8/27>>Chest x-ray: Views increasing airspace opacities bilaterally consistent with Covid pneumonia.  COVID-19 medications: Steroids: 8/27>> Remdesivir: 8/27>> Baricitinib:  Antibiotics: None  Microbiology data: 8/27>> blood culture: No growth  Procedures: None  Consults: None  DVT prophylaxis: enoxaparin (LOVENOX) injection 40 mg Start: 10/10/19 1000 SCDs Start: 10/09/19 2327    Subjective:   Patient in bed, appears comfortable, denies any headache, no fever, no chest pain or pressure, no shortness of breath , no abdominal pain. No focal weakness.    Assessment  & Plan :   Acute Hypoxic Resp Failure due to Covid 19 Viral pneumonia: Has moderate hypoxemia-titrated down to 3 L of oxygen this morning.  Looks comfortable-change  Decadron to Solu-Medrol, continue Remdesivir.  RN aware that if hypoxemia worsens or if O2 requirement approaches 5 L-she is to let me know.    Consent for baricitinib taken-he understands rationale/risk/benefits of baricitinib-he has no history of TB-and consents to the use of baricitinib.   Addendum: FiO2 back up to 5 L-we will go and start baricitinib.  Fever: afebrile O2 requirements:  SpO2: 93 % O2 Flow Rate (L/min): 5 L/min   Recent Labs  Lab 10/04/19 1256 10/09/19 1837 10/09/19 2350 10/10/19 0729 10/10/19 0740 10/11/19 0304  WBC 3.7* 3.6* 4.4  2.3*  --  4.2  PLT 133* 192 208 197  --  241  CRP  --  8.7*  --   --  9.9* 6.3*  DDIMER  --  1.00*  --  1.00*  --  0.68*  PROCALCITON  --  <0.10  --   --   --   --   LATICACIDVEN 1.2 1.8 1.2  --   --   --      Prone/Incentive Spirometry: encouraged incentive spirometry use 3-4/hour.  Leukopenia: Secondary to COVID-19 infection-should improve with time.  Transaminitis: Mild-likely secondary to COVID-19-stable for follow-up-no need for further work-up at this time.  HTN: BP controlled-continue Imdur, lisinopril  DM-2 (A1c 7.7 on 08/21/2019): CBG stable-continue SSI-continue to hold all oral hypoglycemic agents.  Recent Labs    10/10/19 2110 10/11/19 0751 10/11/19 1115  GLUCAP 220* 189* 258*   Obesity: Estimated body mass index is 33.27 kg/m as calculated from the following:   Height as of this encounter: 5\' 6"  (1.676 m).   Weight as of this encounter: 93.5 kg.   Vent Settings: N/A  Condition - Guarded  Family Communication  :  Spouse Sunday Spillers [phone])-called on 8/28-mailbox full-unable to leave voicemail.  Code Status :  Full Code  Diet :  Diet Order            Diet heart healthy/carb modified Room service appropriate? Yes; Fluid consistency: Thin  Diet effective now                  Disposition Plan  :   Status is: Inpatient  Remains inpatient appropriate because:Inpatient level of care appropriate  due to severity of illness   Dispo: The patient is from: Home              Anticipated d/c is to: Home              Anticipated d/c date is: > 3 days              Patient currently is not medically stable to d/c.   Barriers to discharge: Hypoxia requiring O2 supplementation/complete 5 days of IV Remdesivir  Antimicorbials  :    Anti-infectives (From admission, onward)   Start     Dose/Rate Route Frequency Ordered Stop   10/10/19 1000  remdesivir 100 mg in sodium chloride 0.9 % 100 mL IVPB       "Followed by" Linked Group Details   100 mg 200 mL/hr over 30 Minutes Intravenous Daily 10/09/19 2100 10/14/19 0959   10/09/19 2130  remdesivir 200 mg in sodium chloride 0.9% 250 mL IVPB       "Followed by" Linked Group Details   200 mg 580 mL/hr over 30 Minutes Intravenous Once 10/09/19 2100 10/10/19 0132      Inpatient Medications  Scheduled Meds: . aspirin EC  81 mg Oral Daily  . baricitinib  4 mg Oral Daily  . enoxaparin (LOVENOX) injection  40 mg Subcutaneous Daily  . insulin aspart  0-15 Units Subcutaneous TID WC  . insulin aspart  0-5 Units Subcutaneous QHS  . isosorbide mononitrate  60 mg Oral Daily  . lisinopril  10 mg Oral Daily  . mouth rinse  15 mL Mouth Rinse BID  . methylPREDNISolone (SOLU-MEDROL) injection  60 mg Intravenous BID  . pantoprazole  40 mg Oral Q1200   Continuous Infusions: . 0.9 % NaCl with KCl 20 mEq / L 75 mL/hr at 10/09/19 2353  . remdesivir 100 mg  in NS 100 mL 100 mg (10/11/19 0915)   PRN Meds:.acetaminophen **OR** [DISCONTINUED] acetaminophen, hydrALAZINE, [DISCONTINUED] ondansetron **OR** ondansetron (ZOFRAN) IV   Time Spent in minutes  25 See all Orders from today for further details   Lala Lund M.D on 10/11/2019 at 11:34 AM  To page go to [email or web address] - use universal password  [facility] -  Office  [phone]    Objective:   Vitals:   10/11/19 0000 10/11/19 0400 10/11/19 0746 10/11/19 1113  BP: (!) 91/56 (!) 96/58  100/63 (!) 88/54  Pulse: 65 68 67 71  Resp: 20 18 20  (!) 24  Temp: 98.1 F (36.7 C) 98.4 F (36.9 C) 98 F (36.7 C) 97.7 F (36.5 C)  TempSrc: Oral Oral Oral Axillary  SpO2: 92% 93% 90% 93%  Weight:      Height:        Wt Readings from Last 3 Encounters:  10/10/19 93.5 kg  10/05/19 95.3 kg  04/07/19 101.2 kg     Intake/Output Summary (Last 24 hours) at 10/11/2019 1134 Last data filed at 10/11/2019 1121 Gross per 24 hour  Intake 897 ml  Output 1330 ml  Net -433 ml     Physical Exam  Awake Alert, No new F.N deficits, Normal affect [place].AT,PERRAL Supple Neck,No JVD, No cervical lymphadenopathy appriciated.  Symmetrical Chest wall movement, Good air movement bilaterally, CTAB RRR,No Gallops, Rubs or new Murmurs, No Parasternal Heave +ve B.Sounds, Abd Soft, No tenderness, No organomegaly appriciated, No rebound - guarding or rigidity. No Cyanosis, Clubbing or edema, No new Rash or bruise    Data Review:    CBC Recent Labs  Lab 10/04/19 1256 10/09/19 1837 10/09/19 2350 10/10/19 0729 10/11/19 0304  WBC 3.7* 3.6* 4.4 2.3* 4.2  HGB 14.6 14.3 14.0 13.7 11.9*  HCT 45.1 43.7 42.5 41.0 36.3*  PLT 133* 192 208 197 241  MCV 92.4 91.8 90.6 90.5 89.6  MCH 29.9 30.0 29.9 30.2 29.4  MCHC 32.4 32.7 32.9 33.4 32.8  RDW 14.2 14.1 14.0 13.9 13.7  LYMPHSABS 0.6* 0.6*  --   --   --   MONOABS 0.3 0.1  --   --   --   EOSABS 0.0 0.0  --   --   --   BASOSABS 0.0 0.0  --   --   --     Chemistries  Recent Labs  Lab 10/04/19 1256 10/09/19 1837 10/09/19 2350 10/10/19 0729 10/11/19 0304  NA 133* 135  --  138 135  K 4.0 3.7  --  4.2 4.1  CL 101 102  --  104 101  CO2 21* 21*  --  22 24  GLUCOSE 180* 103*  --  160* 185*  BUN 14 21  --  18 28*  CREATININE 1.04 1.05 0.94 0.81 0.90  CALCIUM 8.5* 8.5*  --  8.3* 8.0*  MG  --   --   --  2.1  --   AST 60* 65*  --  67* 55*  ALT 56* 49*  --  49* 45*  ALKPHOS 48 45  --  47 35*  BILITOT 0.6 1.0  --  0.7 0.7    ------------------------------------------------------------------------------------------------------------------ Recent Labs    10/09/19 1837  TRIG 66    Lab Results  Component Value Date   HGBA1C 7.7 (H) 08/21/2019   ------------------------------------------------------------------------------------------------------------------ No results for input(s): TSH, T4TOTAL, T3FREE, THYROIDAB in the last 72 hours.  Invalid input(s): FREET3 ------------------------------------------------------------------------------------------------------------------ Recent Labs    10/10/19 0729 10/11/19 0304  FERRITIN 790* 710*    Coagulation profile Recent Labs  Lab 10/10/19 0729  INR 1.1    Recent Labs    10/10/19 0729 10/11/19 0304  DDIMER 1.00* 0.68*    Cardiac Enzymes No results for input(s): CKMB, TROPONINI, MYOGLOBIN in the last 168 hours.  Invalid input(s): CK ------------------------------------------------------------------------------------------------------------------    Component Value Date/Time   BNP 36.7 10/11/2019 0304   BNP 11.3 08/28/2010 0000    Micro Results Recent Results (from the past 240 hour(s))  Blood Culture (routine x 2)     Status: None (Preliminary result)   Collection Time: 10/09/19  6:38 PM   Specimen: BLOOD  Result Value Ref Range Status   Specimen Description BLOOD RIGHT ANTECUBITAL  Final   Special Requests   Final    BOTTLES DRAWN AEROBIC AND ANAEROBIC Blood Culture adequate volume   Culture   Final    NO GROWTH < 24 HOURS Performed at [facility], [address], [place], [place] [address]    Report Status PENDING  Incomplete  Blood Culture (routine x 2)     Status: None (Preliminary result)   Collection Time: 10/09/19  7:15 PM   Specimen: BLOOD  Result Value Ref Range Status   Specimen Description BLOOD LEFT ANTECUBITAL  Final   Special Requests   Final    BOTTLES DRAWN AEROBIC AND ANAEROBIC Blood Culture results may  not be optimal due to an inadequate volume of blood received in culture bottles   Culture   Final    NO GROWTH < 24 HOURS Performed at [facility], [place] [address], [place], [place] [address]    Report Status PENDING  Incomplete    Radiology Reports DG Chest Port 1 View  Result Date: 10/09/2019 CLINICAL DATA:  Cough and shortness of breath, COVID-19 positivity EXAM: PORTABLE CHEST 1 VIEW COMPARISON:  10/04/2019 FINDINGS: Cardiac shadow is enlarged. Aortic calcifications are again seen. Lungs are well aerated but now demonstrate diffuse increased airspace opacities bilaterally consistent with the given clinical history. No bony abnormality is noted. IMPRESSION: Diffuse increase in airspace opacities bilaterally consistent with the given clinical history of COVID-19 positivity. Electronically Signed   By: Inez Catalina M.D.   On: 10/09/2019 19:06   DG Chest Portable 1 View  Result Date: 10/04/2019 CLINICAL DATA:  COVID, shortness of breath EXAM: PORTABLE CHEST 1 VIEW COMPARISON:  Chest x-ray from 2012 FINDINGS: Image rotated to the LEFT. Accounting for this cardiomediastinal contours and hilar structures are normal. Lungs are clear.  No evidence of effusion on frontal view. On limited assessment skeletal structures are unremarkable. IMPRESSION: No acute cardiopulmonary disease. Electronically Signed   By: Zetta Bills M.D.   On: 10/04/2019 13:46

## 2019-10-11 NOTE — Plan of Care (Signed)
  Problem: Education: Goal: Knowledge of risk factors and measures for prevention of condition will improve Outcome: Progressing   Problem: Coping: Goal: Psychosocial and spiritual needs will be supported Outcome: Progressing   Problem: Respiratory: Goal: Will maintain a patent airway Outcome: Progressing Goal: Complications related to the disease process, condition or treatment will be avoided or minimized Outcome: Progressing   Problem: Education: Goal: Knowledge of General Education information will improve Description: Including pain rating scale, medication(s)/side effects and non-pharmacologic comfort measures Outcome: Progressing   Problem: Health Behavior/Discharge Planning: Goal: Ability to manage health-related needs will improve Outcome: Progressing   Problem: Clinical Measurements: Goal: Respiratory complications will improve Outcome: Progressing   Problem: Activity: Goal: Risk for activity intolerance will decrease Outcome: Progressing

## 2019-10-11 NOTE — Progress Notes (Signed)
Pt sitting up in recliner chair in no acute distress. Pt alert and oriented x4. Pt on phone with wife updating her on current hospital stay. Respirations even and unlabored on 5 L/min O2 with SpO2 maintaining at 89-93%. Pt was able to ambulate around the room with no c/o SOB or weakness. Standby assist only. Call bell within reach. All questions and concerns addressed with pt at this time.

## 2019-10-12 LAB — CBC
HCT: 37.3 % — ABNORMAL LOW (ref 39.0–52.0)
Hemoglobin: 12.2 g/dL — ABNORMAL LOW (ref 13.0–17.0)
MCH: 29.2 pg (ref 26.0–34.0)
MCHC: 32.7 g/dL (ref 30.0–36.0)
MCV: 89.2 fL (ref 80.0–100.0)
Platelets: 250 10*3/uL (ref 150–400)
RBC: 4.18 MIL/uL — ABNORMAL LOW (ref 4.22–5.81)
RDW: 13.5 % (ref 11.5–15.5)
WBC: 9.5 10*3/uL (ref 4.0–10.5)
nRBC: 0 % (ref 0.0–0.2)

## 2019-10-12 LAB — URINALYSIS, ROUTINE W REFLEX MICROSCOPIC
Bacteria, UA: NONE SEEN
Bilirubin Urine: NEGATIVE
Glucose, UA: >=500 mg/dL — AB
Hgb urine dipstick: NEGATIVE
Ketones, ur: NEGATIVE mg/dL
Leukocytes,Ua: NEGATIVE
Nitrite: NEGATIVE
Protein, ur: NEGATIVE mg/dL
Specific Gravity, Urine: 1.029 (ref 1.005–1.030)
pH: 5 (ref 5.0–8.0)

## 2019-10-12 LAB — COMPREHENSIVE METABOLIC PANEL
ALT: 46 U/L — ABNORMAL HIGH (ref 0–44)
AST: 47 U/L — ABNORMAL HIGH (ref 15–41)
Albumin: 2.4 g/dL — ABNORMAL LOW (ref 3.5–5.0)
Alkaline Phosphatase: 39 U/L (ref 38–126)
Anion gap: 8 (ref 5–15)
BUN: 35 mg/dL — ABNORMAL HIGH (ref 8–23)
CO2: 24 mmol/L (ref 22–32)
Calcium: 8.1 mg/dL — ABNORMAL LOW (ref 8.9–10.3)
Chloride: 100 mmol/L (ref 98–111)
Creatinine, Ser: 1 mg/dL (ref 0.61–1.24)
GFR calc Af Amer: >60 mL/min (ref >60)
GFR calc non Af Amer: >60 mL/min (ref >60)
Glucose, Bld: 240 mg/dL — ABNORMAL HIGH (ref 70–99)
Potassium: 4.4 mmol/L (ref 3.5–5.1)
Sodium: 132 mmol/L — ABNORMAL LOW (ref 135–145)
Total Bilirubin: 0.7 mg/dL (ref 0.3–1.2)
Total Protein: 5.4 g/dL — ABNORMAL LOW (ref 6.5–8.1)

## 2019-10-12 LAB — GLUCOSE, CAPILLARY
Glucose-Capillary: 218 mg/dL — ABNORMAL HIGH (ref 70–99)
Glucose-Capillary: 233 mg/dL — ABNORMAL HIGH (ref 70–99)
Glucose-Capillary: 257 mg/dL — ABNORMAL HIGH (ref 70–99)
Glucose-Capillary: 188 mg/dL — ABNORMAL HIGH (ref 70–99)

## 2019-10-12 LAB — CREATININE, URINE, RANDOM: Creatinine, Urine: 87.69 mg/dL

## 2019-10-12 LAB — OSMOLALITY, URINE: Osmolality, Ur: 905 mOsm/kg — ABNORMAL HIGH (ref 300–900)

## 2019-10-12 LAB — SODIUM, URINE, RANDOM: Sodium, Ur: <10 mmol/L

## 2019-10-12 LAB — URIC ACID: Uric Acid, Serum: 5.1 mg/dL (ref 3.7–8.6)

## 2019-10-12 LAB — BRAIN NATRIURETIC PEPTIDE: B Natriuretic Peptide: 43.9 pg/mL (ref 0.0–100.0)

## 2019-10-12 LAB — C-REACTIVE PROTEIN: CRP: 2.4 mg/dL — ABNORMAL HIGH (ref <1.0)

## 2019-10-12 LAB — OSMOLALITY: Osmolality: 304 mOsm/kg — ABNORMAL HIGH (ref 275–295)

## 2019-10-12 LAB — D-DIMER, QUANTITATIVE: D-Dimer, Quant: 0.5 ug/mL-FEU (ref 0.00–0.50)

## 2019-10-12 NOTE — Evaluation (Addendum)
Occupational Therapy Evaluation Patient Details Name: Patrick Luna MRN: [id number] DOB: [birth year] Today's Date: 10/12/2019    History of Present Illness Pt is a 74 y.o. M with significant PMH of HTN, HLD, DM2, diagnosed with COVID-19 8-9 days prior to this hospitalization who presents 8/27 with SOB and found to have acute hypoxic respiratory failure secondary to COVID-19 pneumonia. Pt is unvaccinated.   Clinical Impression   This 74 y/o male presents with the above. 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 Pt very pleasant and willing to participate in therapy session, presents seated in recliner. Pt performing mobility tasks without AD with minguard assist, standing grooming ADL with supervision throughout. Initially using 6L for mobility tasks with SpO2 maintaining >/=95%, titrated down to 4L with additional mobility/standing ADL tasks with SpO2 maintaining >/=89%. Pt denies dizziness and with no overt dyspnea noted during session. Pt to benefit from continued acute OT services to maximize his safety and independence with ADL and mobility. Do not anticipate he will require follow up OT services after discharge.   BP start of session 93/61 End of session 100/60     Follow Up Recommendations  Supervision - Intermittent;No OT follow up    Equipment Recommendations  None recommended by OT           Precautions / Restrictions Precautions Precautions: None Restrictions Weight Bearing Restrictions: No      Mobility Bed Mobility               General bed mobility comments: OOB in chair  Transfers Overall transfer level: Modified independent Equipment used: None                  Balance Overall balance assessment: Mild deficits observed, not formally tested                                         ADL either performed or assessed with clinical judgement   ADL Overall ADL's :  Needs assistance/impaired Eating/Feeding: Modified independent;Sitting   Grooming: Wash/dry face;Wash/dry hands;Oral care;Supervision/safety;Standing Grooming Details (indicate cue type and reason): prolonged time standing at sink, supervision throughout  Upper Body Bathing: Modified independent;Sitting   Lower Body Bathing: Min guard;Sit to/from stand   Upper Body Dressing : Modified independent;Sitting   Lower Body Dressing: Min guard;Sit to/from stand   Toilet Transfer: Supervision/safety;Ambulation Toilet Transfer Details (indicate cue type and reason): simulated via transfer to/from recliner Toileting- Clothing Manipulation and Hygiene: Min guard;Sitting/lateral lean;Sit to/from stand       Functional mobility during ADLs: Min guard                           Pertinent Vitals/Pain Pain Assessment: No/denies pain     Hand Dominance     Extremity/Trunk Assessment Upper Extremity Assessment Upper Extremity Assessment: Overall WFL for tasks assessed   Lower Extremity Assessment Lower Extremity Assessment: Defer to PT evaluation;Overall [facility] for tasks assessed   Cervical / Trunk Assessment Cervical / Trunk Assessment: Normal   Communication Communication Communication: HOH   Cognition Arousal/Alertness: Awake/alert Behavior During Therapy: WFL for tasks assessed/performed Overall Cognitive Status: Within Functional Limits for tasks assessed  General Comments       Exercises Exercises: Other exercises Other Exercises Other Exercises: Incentive spirometer x 5, pulling to 1250   Shoulder Instructions      Home Living Family/patient expects to be discharged to:: Private residence Living Arrangements: Spouse/significant other Available Help at Discharge: Family Type of Home: House Home Access: Stairs to enter Technical brewer of Steps: 3   Home Layout: One level     Bathroom Shower/Tub:  Occupational psychologist: Standard     Home Equipment: None          Prior Functioning/Environment Level of Independence: Independent        Comments: 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 Pt and his wife both have COVID; pt wife is at home recovering.        OT Problem List: Decreased activity tolerance;Decreased strength;Cardiopulmonary status limiting activity;Obesity;Decreased knowledge of use of DME or AE      OT Treatment/Interventions: Self-care/ADL training;Therapeutic exercise;Energy conservation;DME and/or AE instruction;Therapeutic activities;Patient/family education;Balance training    OT Goals(Current goals can be found in the care plan section) Acute Rehab OT Goals Patient Stated Goal: return to independence OT Goal Formulation: With patient Time For Goal Achievement: 10/26/19 Potential to Achieve Goals: Good  OT Frequency: Min 2X/week   Barriers to D/C:            Co-evaluation              AM-PAC OT "6 Clicks" Daily Activity     Outcome Measure Help from another person eating meals?: None Help from another person taking care of personal grooming?: A Little Help from another person toileting, which includes using toliet, bedpan, or urinal?: A Little Help from another person bathing (including washing, rinsing, drying)?: A Little Help from another person to put on and taking off regular upper body clothing?: None Help from another person to put on and taking off regular lower body clothing?: A Little 6 Click Score: 20   End of Session Equipment Utilized During Treatment: Oxygen Nurse Communication: Mobility status  Activity Tolerance: Patient tolerated treatment well Patient left: in chair;with call bell/phone within reach  OT Visit Diagnosis: Muscle weakness (generalized) (M62.81);Other (comment) (decreased activity tolerance)                Time: 8115-7262 OT Time Calculation (min): 33  min Charges:  OT General Charges $OT Visit: 1 Visit OT Evaluation $OT Eval Moderate Complexity: 1 Mod OT Treatments $Self Care/Home Management : 8-22 mins  Lou Cal, OT Acute Rehabilitation Services Pager [phone] Office [phone]   Raymondo Band 10/12/2019, 12:57 PM

## 2019-10-12 NOTE — Progress Notes (Signed)
PROGRESS NOTE                                                                                                                                                                                                             Patient Demographics:    Patrick Luna, is a 74 y.o. male, DOB - 16-Feb-1945, WVP:[id number]  Outpatient Primary MD for the patient is Susy Frizzle, MD   Admit date - 10/09/2019   LOS - 3  Chief Complaint  Patient presents with  . Shortness of Breath       Brief Narrative: Patient is a 74 y.o. male with PMHx of HTN, HLD, DM-2-who apparently was diagnosed with COVID-19 approximately 8-9 days prior to this hospitalization-presented to the ED on 8/27 with shortness of breath-found to have acute hypoxic respiratory failure secondary to COVID-19 pneumonia.  COVID-19 vaccinated status: Unvaccinated  Significant Events: 8/22>> ED visit-known Covid +ve-presented with weakness-not hypoxic-no pneumonia on x-ray.  Discharged home. 8/27>> Admit to [facility] for hypoxia due to COVID-19 pneumonia.  Significant studies: 8/22>> chest x-ray: No acute cardiopulmonary disease. 8/27>>Chest x-ray: Views increasing airspace opacities bilaterally consistent with Covid pneumonia.  COVID-19 medications: Steroids: 8/27>> Remdesivir: 8/27>> Baricitinib:  Antibiotics: None  Microbiology data: 8/27>> blood culture: No growth  Procedures: None  Consults: None  DVT prophylaxis: enoxaparin (LOVENOX) injection 40 mg Start: 10/10/19 1000 SCDs Start: 10/09/19 2327    Subjective:   Patient in bed, appears comfortable, denies any headache, no fever, no chest pain or pressure, no shortness of breath , no abdominal pain. No focal weakness.    Assessment  & Plan :   Acute Hypoxic Resp Failure due to Covid 19 Viral pneumonia: Has moderate hypoxemia-titrated down to 3 L of oxygen this morning.  Looks comfortable-change  Decadron to Solu-Medrol, continue Remdesivir, also started on baricitinib.  Indicates seems to have stabilized will advance activity and titrate down oxygen.   O2 requirements:  SpO2: 94 % O2 Flow Rate (L/min): 5 L/min   Recent Labs  Lab 10/09/19 1837 10/09/19 2350 10/10/19 0729 10/10/19 0740 10/11/19 0304 10/12/19 0349  WBC 3.6* 4.4 2.3*  --  4.2 9.5  PLT 192 208 197  --  241 250  CRP 8.7*  --   --  9.9* 6.3* 2.4*  DDIMER 1.00*  --  1.00*  --  0.68* 0.50  PROCALCITON <0.10  --   --   --   --   --   LATICACIDVEN 1.8 1.2  --   --   --   --      Prone/Incentive Spirometry: encouraged incentive spirometry use 3-4/hour.  Leukopenia: Secondary to COVID-19 infection-should improve with time.  Transaminitis: Mild-likely secondary to COVID-19-stable for follow-up-no need for further work-up at this time.  HTN: BP controlled-continue Imdur, lisinopril  DM-2 (A1c 7.7 on 08/21/2019): CBG stable-continue SSI-continue to hold all oral hypoglycemic agents.  Recent Labs    10/11/19 2017 10/12/19 0753 10/12/19 1150  GLUCAP 228* 218* 233*   Obesity: Estimated body mass index is 33.27 kg/m as calculated from the following:   Height as of this encounter: 5\' 6"  (1.676 m).   Weight as of this encounter: 93.5 kg.   Vent Settings: N/A  Condition - Guarded  Family Communication  :  Spouse Sunday Spillers [phone])-called on 8/28-mailbox full-unable to leave voicemail.  Code Status :  Full Code  Diet :  Diet Order            Diet heart healthy/carb modified Room service appropriate? Yes; Fluid consistency: Thin  Diet effective now                  Disposition Plan  :   Status is: Inpatient  Remains inpatient appropriate because:Inpatient level of care appropriate due to severity of illness   Dispo: The patient is from: Home              Anticipated d/c is to: Home              Anticipated d/c date is: > 3 days              Patient currently is not medically stable to  d/c.   Barriers to discharge: Hypoxia requiring O2 supplementation/complete 5 days of IV Remdesivir  Antimicorbials  :    Anti-infectives (From admission, onward)   Start     Dose/Rate Route Frequency Ordered Stop   10/10/19 1000  remdesivir 100 mg in sodium chloride 0.9 % 100 mL IVPB       "Followed by" Linked Group Details   100 mg 200 mL/hr over 30 Minutes Intravenous Daily 10/09/19 2100 10/14/19 0959   10/09/19 2130  remdesivir 200 mg in sodium chloride 0.9% 250 mL IVPB       "Followed by" Linked Group Details   200 mg 580 mL/hr over 30 Minutes Intravenous Once 10/09/19 2100 10/10/19 0132      Inpatient Medications  Scheduled Meds: . aspirin EC  81 mg Oral Daily  . baricitinib  4 mg Oral Daily  . enoxaparin (LOVENOX) injection  40 mg Subcutaneous Daily  . insulin aspart  0-15 Units Subcutaneous TID WC  . insulin aspart  0-5 Units Subcutaneous QHS  . isosorbide mononitrate  60 mg Oral Daily  . lisinopril  10 mg Oral Daily  . mouth rinse  15 mL Mouth Rinse BID  . methylPREDNISolone (SOLU-MEDROL) injection  60 mg Intravenous BID  . pantoprazole  40 mg Oral Q1200   Continuous Infusions: . remdesivir 100 mg in NS 100 mL Stopped (10/12/19 0951)   PRN Meds:.acetaminophen **OR** [DISCONTINUED] acetaminophen, hydrALAZINE, [DISCONTINUED] ondansetron **OR** ondansetron (ZOFRAN) IV   Time Spent in minutes  25 See all Orders from today for further details   Lala Lund M.D on 10/12/2019 at 12:24 PM  To page go to [email or web address] - use universal password  [facility] -  Office  [phone]    Objective:   Vitals:   10/12/19 0000 10/12/19 0429 10/12/19 0735 10/12/19 1143  BP: (!) 87/62 105/64 103/69 91/60  Pulse: (!) 57 73 63 62  Resp: 18 20 18 18   Temp: 97.7 F (36.5 C) 97.6 F (36.4 C) 97.7 F (36.5 C) 97.9 F (36.6 C)  TempSrc: Axillary Axillary Oral Oral  SpO2: 93% 90% 92% 94%  Weight:      Height:        Wt Readings from Last 3 Encounters:   10/10/19 93.5 kg  10/05/19 95.3 kg  04/07/19 101.2 kg     Intake/Output Summary (Last 24 hours) at 10/12/2019 1224 Last data filed at 10/12/2019 0916 Gross per 24 hour  Intake 776 ml  Output 855 ml  Net -79 ml     Physical Exam  Awake Alert, No new F.N deficits, Normal affect [address].AT,PERRAL Supple Neck,No JVD, No cervical lymphadenopathy appriciated.  Symmetrical Chest wall movement, Good air movement bilaterally, CTAB RRR,No Gallops, Rubs or new Murmurs, No Parasternal Heave +ve B.Sounds, Abd Soft, No tenderness, No organomegaly appriciated, No rebound - guarding or rigidity. No Cyanosis, Clubbing or edema, No new Rash or bruise     Data Review:    CBC Recent Labs  Lab 10/09/19 1837 10/09/19 2350 10/10/19 0729 10/11/19 0304 10/12/19 0349  WBC 3.6* 4.4 2.3* 4.2 9.5  HGB 14.3 14.0 13.7 11.9* 12.2*  HCT 43.7 42.5 41.0 36.3* 37.3*  PLT 192 208 197 241 250  MCV 91.8 90.6 90.5 89.6 89.2  MCH 30.0 29.9 30.2 29.4 29.2  MCHC 32.7 32.9 33.4 32.8 32.7  RDW 14.1 14.0 13.9 13.7 13.5  LYMPHSABS 0.6*  --   --   --   --   MONOABS 0.1  --   --   --   --   EOSABS 0.0  --   --   --   --   BASOSABS 0.0  --   --   --   --     Chemistries  Recent Labs  Lab 10/09/19 1837 10/09/19 2350 10/10/19 0729 10/11/19 0304 10/12/19 0349  NA 135  --  138 135 132*  K 3.7  --  4.2 4.1 4.4  CL 102  --  104 101 100  CO2 21*  --  22 24 24   GLUCOSE 103*  --  160* 185* 240*  BUN 21  --  18 28* 35*  CREATININE 1.05 0.94 0.81 0.90 1.00  CALCIUM 8.5*  --  8.3* 8.0* 8.1*  MG  --   --  2.1  --   --   AST 65*  --  67* 55* 47*  ALT 49*  --  49* 45* 46*  ALKPHOS 45  --  47 35* 39  BILITOT 1.0  --  0.7 0.7 0.7   ------------------------------------------------------------------------------------------------------------------ Recent Labs    10/09/19 1837  TRIG 66    Lab Results  Component Value Date   HGBA1C 7.7 (H) 08/21/2019    ------------------------------------------------------------------------------------------------------------------ No results for input(s): TSH, T4TOTAL, T3FREE, THYROIDAB in the last 72 hours.  Invalid input(s): FREET3 ------------------------------------------------------------------------------------------------------------------ Recent Labs    10/10/19 0729 10/11/19 0304  FERRITIN 790* 710*    Coagulation profile Recent Labs  Lab 10/10/19 0729  INR 1.1    Recent Labs    10/11/19 0304 10/12/19 0349  DDIMER 0.68* 0.50    Cardiac Enzymes No results for input(s): CKMB, TROPONINI, MYOGLOBIN in the last 168 hours.  Invalid input(s): CK ------------------------------------------------------------------------------------------------------------------    Component Value Date/Time   BNP 43.9 10/12/2019 0349   BNP 11.3 08/28/2010 0000    Micro Results Recent Results (from the past 240 hour(s))  Blood Culture (routine x 2)     Status: None (Preliminary result)   Collection Time: 10/09/19  6:38 PM   Specimen: BLOOD  Result Value Ref Range Status   Specimen Description BLOOD RIGHT ANTECUBITAL  Final   Special Requests   Final    BOTTLES DRAWN AEROBIC AND ANAEROBIC Blood Culture adequate volume   Culture   Final    NO GROWTH 3 DAYS Performed at [facility], [address], [place], [place] [address]    Report Status PENDING  Incomplete  Blood Culture (routine x 2)     Status: None (Preliminary result)   Collection Time: 10/09/19  7:15 PM   Specimen: BLOOD  Result Value Ref Range Status   Specimen Description BLOOD LEFT ANTECUBITAL  Final   Special Requests   Final    BOTTLES DRAWN AEROBIC AND ANAEROBIC Blood Culture results may not be optimal due to an inadequate volume of blood received in culture bottles   Culture   Final    NO GROWTH 3 DAYS Performed at [facility], [address], [place], [place] [address]    Report Status PENDING  Incomplete     Radiology Reports DG Chest Port 1 View  Result Date: 10/09/2019 CLINICAL DATA:  Cough and shortness of breath, COVID-19 positivity EXAM: PORTABLE CHEST 1 VIEW COMPARISON:  10/04/2019 FINDINGS: Cardiac shadow is enlarged. Aortic calcifications are again seen. Lungs are well aerated but now demonstrate diffuse increased airspace opacities bilaterally consistent with the given clinical history. No bony abnormality is noted. IMPRESSION: Diffuse increase in airspace opacities bilaterally consistent with the given clinical history of COVID-19 positivity. Electronically Signed   By: Inez Catalina M.D.   On: 10/09/2019 19:06   DG Chest Portable 1 View  Result Date: 10/04/2019 CLINICAL DATA:  COVID, shortness of breath EXAM: PORTABLE CHEST 1 VIEW COMPARISON:  Chest x-ray from 2012 FINDINGS: Image rotated to the LEFT. Accounting for this cardiomediastinal contours and hilar structures are normal. Lungs are clear.  No evidence of effusion on frontal view. On limited assessment skeletal structures are unremarkable. IMPRESSION: No acute cardiopulmonary disease. Electronically Signed   By: Zetta Bills M.D.   On: 10/04/2019 13:46

## 2019-10-12 NOTE — Progress Notes (Signed)
Pt sitting up in chair eating dinner tray. Pt alert and oriented x4. Respirations even and unlabored on 3 L/min O2 Lane. Pt was able to ambulate approx 200 ft today while on 5 L/ min without dyspnea. Pt's SpO2 maintained above 93% while ambulation. All questions/concern addressed with pt at this time. Call beach within reach.

## 2019-10-12 NOTE — Progress Notes (Signed)
Inpatient Diabetes Program Recommendations  AACE/ADA: New Consensus Statement on Inpatient Glycemic Control (2015)  Target Ranges:  Prepandial:   less than 140 mg/dL      Peak postprandial:   less than 180 mg/dL (1-2 hours)      Critically ill patients:  140 - 180 mg/dL   Lab Results  Component Value Date   GLUCAP 218 (H) 10/12/2019   HGBA1C 7.7 (H) 08/21/2019    Review of Glycemic Control Results for CEDRICK, PARTAIN (MRN [id number]) as of 10/12/2019 10:28  Ref. Range 10/11/2019 07:51 10/11/2019 11:15 10/11/2019 16:49 10/11/2019 17:53 10/11/2019 20:17 10/12/2019 07:53  Glucose-Capillary Latest Ref Range: 70 - 99 mg/dL 189 (H) 258 (H) 218 (H) 243 (H) 228 (H) 218 (H)   Diabetes history: DM 2 Outpatient Diabetes medications: Metformin 2000 mg Daily, Jardiance 25 mg Daily Current orders for Inpatient glycemic control:  Novolog 0-15 units tid + hs  Solumedrol 60 mg QQ12  Inpatient Diabetes Program Recommendations:    -  Pt may benefit from basal insulin, Levemir 5 bid -  may benefit from DPP-4 such as Tradjenta   Thanks,  Tama Headings RN, MSN, BC-ADM Inpatient Diabetes Coordinator Team Pager [phone] (8a-5p)

## 2019-10-13 LAB — C-REACTIVE PROTEIN: CRP: 1.1 mg/dL — ABNORMAL HIGH (ref <1.0)

## 2019-10-13 LAB — COMPREHENSIVE METABOLIC PANEL
ALT: 48 U/L — ABNORMAL HIGH (ref 0–44)
AST: 41 U/L (ref 15–41)
Albumin: 2.5 g/dL — ABNORMAL LOW (ref 3.5–5.0)
Alkaline Phosphatase: 39 U/L (ref 38–126)
Anion gap: 10 (ref 5–15)
BUN: 29 mg/dL — ABNORMAL HIGH (ref 8–23)
CO2: 23 mmol/L (ref 22–32)
Calcium: 8.3 mg/dL — ABNORMAL LOW (ref 8.9–10.3)
Chloride: 101 mmol/L (ref 98–111)
Creatinine, Ser: 0.89 mg/dL (ref 0.61–1.24)
GFR calc Af Amer: >60 mL/min (ref >60)
GFR calc non Af Amer: >60 mL/min (ref >60)
Glucose, Bld: 215 mg/dL — ABNORMAL HIGH (ref 70–99)
Potassium: 4.5 mmol/L (ref 3.5–5.1)
Sodium: 134 mmol/L — ABNORMAL LOW (ref 135–145)
Total Bilirubin: 0.7 mg/dL (ref 0.3–1.2)
Total Protein: 5.5 g/dL — ABNORMAL LOW (ref 6.5–8.1)

## 2019-10-13 LAB — BRAIN NATRIURETIC PEPTIDE: B Natriuretic Peptide: 67.3 pg/mL (ref 0.0–100.0)

## 2019-10-13 LAB — CBC
HCT: 38.3 % — ABNORMAL LOW (ref 39.0–52.0)
Hemoglobin: 12.8 g/dL — ABNORMAL LOW (ref 13.0–17.0)
MCH: 29.8 pg (ref 26.0–34.0)
MCHC: 33.4 g/dL (ref 30.0–36.0)
MCV: 89.3 fL (ref 80.0–100.0)
Platelets: 273 10*3/uL (ref 150–400)
RBC: 4.29 MIL/uL (ref 4.22–5.81)
RDW: 13.6 % (ref 11.5–15.5)
WBC: 11.1 10*3/uL — ABNORMAL HIGH (ref 4.0–10.5)
nRBC: 0 % (ref 0.0–0.2)

## 2019-10-13 LAB — GLUCOSE, CAPILLARY
Glucose-Capillary: 211 mg/dL — ABNORMAL HIGH (ref 70–99)
Glucose-Capillary: 263 mg/dL — ABNORMAL HIGH (ref 70–99)
Glucose-Capillary: 222 mg/dL — ABNORMAL HIGH (ref 70–99)
Glucose-Capillary: 251 mg/dL — ABNORMAL HIGH (ref 70–99)

## 2019-10-13 LAB — D-DIMER, QUANTITATIVE: D-Dimer, Quant: 0.48 ug/mL-FEU (ref 0.00–0.50)

## 2019-10-13 NOTE — Progress Notes (Signed)
Physical Therapy Treatment & Discharge Patient Details Name: Patrick Luna MRN: [id number] DOB: March 26, 1945 Today's Date: 10/13/2019    History of Present Illness Pt is a 75 y.o. M with significant PMH of HTN, HLD, DM2, diagnosed with COVID-19 8-9 days prior to this hospitalization who presents 8/27 with SOB and found to have acute hypoxic respiratory failure secondary to COVID-19 pneumonia. Pt is unvaccinated.   PT Comments    Pt progressing well with mobility. Pt independent with mobility and ADL tasks, only requiring assist for lines/O2 set-up, then pt able to manage himself. SpO2 down to 83% on RA (unsure if reliable reading via finger probe; pt asymptomatic without notable increase in WOB). Increased to 2L O2 for rest of session. Pt maintaining >/94% on 2L at rest. Pt has met short-term acute PT goals. Reviewed education. Will d/c acute PT. Encouraged more frequent ambulation, pt only needs staff assist to set-up lines/O2 tank.    Follow Up Recommendations  No PT follow up     Equipment Recommendations  None recommended by PT    Recommendations for Other Services       Precautions / Restrictions Precautions Precautions: None Restrictions Weight Bearing Restrictions: No    Mobility  Bed Mobility               General bed mobility comments: OOB in chair  Transfers Overall transfer level: Independent Equipment used: None                Ambulation/Gait Ambulation/Gait assistance: Independent Gait Distance (Feet): 700 Feet Assistive device: None Gait Pattern/deviations: WFL(Within Functional Limits)   Gait velocity interpretation: 1.31 - 2.62 ft/sec, indicative of limited community ambulator General Gait Details: Slow and steady pace, no gross imbalance   Stairs             Wheelchair Mobility    Modified Rankin (Stroke Patients Only)       Balance Overall balance assessment: No apparent balance deficits (not formally assessed)    Sitting balance-Leahy Scale: Normal       Standing balance-Leahy Scale: Good               High level balance activites: Side stepping;Backward walking;Direction changes;Turns;Sudden stops;Head turns High Level Balance Comments: No overt instability or LOB with higher level balance tasks            Cognition Arousal/Alertness: Awake/alert Behavior During Therapy: WFL for tasks assessed/performed Overall Cognitive Status: Within Functional Limits for tasks assessed                                        Exercises Other Exercises Other Exercises: Incentive spirometer x10, pulling to 1250-1581mL    General Comments General comments (skin integrity, edema, etc.): SpO2 maintaining 88-92% on RA with ambulation ~450 ft, then down to 83% (good pleth via finger probe) pt asymptomatic without significant WOB. Returning to 85-91% on 2L O2 with rest of walk. Maintaining >/94% on 2L with seated rest. Post-ambulation BP 97/60 (pt denies dizziness)      Pertinent Vitals/Pain Pain Assessment: No/denies pain    Home Living                      Prior Function            PT Goals (current goals can now be found in the care plan section) Progress towards PT goals: Goals  met/education completed, patient discharged from PT    Frequency    Min 3X/week      PT Plan Current plan remains appropriate    Co-evaluation              AM-PAC PT "6 Clicks" Mobility   Outcome Measure  Help needed turning from your back to your side while in a flat bed without using bedrails?: None Help needed moving from lying on your back to sitting on the side of a flat bed without using bedrails?: None Help needed moving to and from a bed to a chair (including a wheelchair)?: None Help needed standing up from a chair using your arms (e.g., wheelchair or bedside chair)?: None Help needed to walk in hospital room?: None Help needed climbing 3-5 steps with a railing? :  None 6 Click Score: 24    End of Session Equipment Utilized During Treatment: Oxygen Activity Tolerance: Patient tolerated treatment well Patient left: in chair;with call bell/phone within reach Nurse Communication: Mobility status PT Visit Diagnosis: Difficulty in walking, not elsewhere classified (R26.2)     Time: 0029-8473 PT Time Calculation (min) (ACUTE ONLY): 26 min  Charges:  $Therapeutic Exercise: 23-37 mins                    Mabeline Caras, PT, DPT Acute Rehabilitation Services  Pager [phone] Office [place] 10/13/2019, 10:27 AM

## 2019-10-13 NOTE — Progress Notes (Signed)
Inpatient Diabetes Program Recommendations  AACE/ADA: New Consensus Statement on Inpatient Glycemic Control (2015)  Target Ranges:  Prepandial:   less than 140 mg/dL      Peak postprandial:   less than 180 mg/dL (1-2 hours)      Critically ill patients:  140 - 180 mg/dL   Lab Results  Component Value Date   GLUCAP 211 (H) 10/13/2019   HGBA1C 7.7 (H) 08/21/2019    Review of Glycemic Control Results for CARMINO, OCAIN (MRN [id number]) as of 10/12/2019 10:28  Ref. Range 10/11/2019 07:51 10/11/2019 11:15 10/11/2019 16:49 10/11/2019 17:53 10/11/2019 20:17 10/12/2019 07:53  Glucose-Capillary Latest Ref Range: 70 - 99 mg/dL 189 (H) 258 (H) 218 (H) 243 (H) 228 (H) 218 (H)  Results for ARMEN, WARING (MRN [id number]) as of 10/13/2019 11:55  Ref. Range 10/12/2019 07:53 10/12/2019 11:50 10/12/2019 16:14 10/12/2019 21:05 10/13/2019 07:46  Glucose-Capillary Latest Ref Range: 70 - 99 mg/dL 218 (H) 233 (H) 257 (H) 188 (H) 211 (H)   Diabetes history: DM 2 Outpatient Diabetes medications: Metformin 2000 mg Daily, Jardiance 25 mg Daily Current orders for Inpatient glycemic control:  Novolog 0-15 units tid + hs  Solumedrol 60 mg QQ12  Inpatient Diabetes Program Recommendations:    -  Pt may benefit from basal insulin, Levemir 5 bid -  may benefit from DPP-4 such as Tradjenta   Thanks,  Tama Headings RN, MSN, BC-ADM Inpatient Diabetes Coordinator Team Pager [phone] (8a-5p)

## 2019-10-13 NOTE — TOC Initial Note (Addendum)
Transition of Care [facility]) - Initial/Assessment Note    Patient Details  Name: Patrick Luna MRN: [id number] Date of Birth: 1945/05/31  Transition of [facility]) CM/SW Contact:    Patrick Collet, RN Phone Number: 10/13/2019, 11:45 AM  Clinical Narrative:              Patrick Luna w patient over the phone to discuss DC plan and DME set up. Patient will return home w wife, will have daughter available to provide transport home, will have home O2 through [employer]. [employer] has delivered POC to unit. Explained use of POC to patient. Reached out to wife at numbers listed earlier today and just now with no answer. Will explain POC to her if I get a return call. Anticipate DC for Wednesday  Spoke w wife, she states that they will be staying w his mother at [facility] in [place]. Provided this information to [employer]. Explained use of POC to wife.        Expected Discharge Plan: Home/Self Care Barriers to Discharge: Continued Medical Work up   Patient Goals and CMS Choice Patient states their goals for this hospitalization and ongoing recovery are:: to go home      Expected Discharge Plan and Services Expected Discharge Plan: Home/Self Care   Discharge Planning Services: CM Consult Post Acute Care Choice: Durable Medical Equipment                   DME Arranged: Oxygen DME Agency: [employer] Date DME Agency Contacted: 10/13/19 Time DME Agency Contacted: 1000 Representative spoke with at DME Agency: Thedore Mins            Prior Living Arrangements/Services   Lives with:: Spouse                   Activities of Daily Living Home Assistive Devices/Equipment: CBG Meter ADL Screening (condition at time of admission) Patient's cognitive ability adequate to safely complete daily activities?: Yes Is the patient deaf or have difficulty hearing?: Yes Does the patient have difficulty seeing, even when wearing glasses/contacts?: No Does the patient have difficulty concentrating,  remembering, or making decisions?: No Patient able to express need for assistance with ADLs?: Yes Does the patient have difficulty dressing or bathing?: No Independently performs ADLs?: Yes (appropriate for developmental age) Does the patient have difficulty walking or climbing stairs?: No Weakness of Legs: Both Weakness of Arms/Hands: None  Permission Sought/Granted                  Emotional Assessment              Admission diagnosis:  Acute respiratory failure with hypoxia ([place]) [J96.01] Acute hypoxemic respiratory failure due to COVID-19 ([place]) [U07.1, J96.01] Patient Active Problem List   Diagnosis Date Noted  . Acute respiratory failure due to COVID-19 ([place]) 10/09/2019  . Acute hypoxemic respiratory failure due to COVID-19 ([place]) 10/09/2019  . Controlled type 2 diabetes mellitus without complication, without long-term current use of insulin ([place]) 09/06/2017  . [place] esophagus   . GERD (gastroesophageal reflux disease) 12/31/2012  . Atypical chest pain 08/28/2010  . Hypercholesteremia 08/28/2010   PCP:  Susy Frizzle, MD Pharmacy:   [id number] - [place], [address] [place] [address] Phone: [phone] Fax: [phone]  [id number] - [place], [place] - [address] [place] [address] Phone: [phone] Fax: [phone]  Social Determinants of Health (SDOH) Interventions    Readmission Risk Interventions No flowsheet data found.

## 2019-10-13 NOTE — Progress Notes (Signed)
SATURATION QUALIFICATIONS: (This note is used to comply with regulatory documentation for home oxygen)  Patient Saturations on Room Air at Rest = 98%  Patient Saturations on Room Air while Ambulating = 84%  Patient Saturations on 2 Liters of oxygen while Ambulating = 93%  Please briefly explain why patient needs home oxygen: Patient needs at home oxygen because he is not able to maintain a saturation above 88% without oxygen.

## 2019-10-14 LAB — CULTURE, BLOOD (ROUTINE X 2)
Culture: NO GROWTH
Culture: NO GROWTH
Special Requests: ADEQUATE

## 2019-10-14 LAB — GLUCOSE, CAPILLARY: Glucose-Capillary: 202 mg/dL — ABNORMAL HIGH (ref 70–99)

## 2019-10-14 MED ORDER — ALBUTEROL SULFATE HFA 108 (90 BASE) MCG/ACT IN AERS
2.0000 | INHALATION_SPRAY | Freq: Four times a day (QID) | RESPIRATORY_TRACT | 0 refills | Status: DC | PRN
Start: 2019-10-14 — End: 2022-01-12

## 2019-10-14 NOTE — Discharge Instructions (Signed)
Follow with Primary MD Susy Frizzle, MD in 7 days   Get CBC, CMP, 2 view Chest X ray -  checked next visit within 1 week by Primary MD    Activity: As tolerated with Full fall precautions use walker/cane & assistance as needed  Disposition Home    Diet: Heart Healthy - Low Carb  Accuchecks 4 times/day, Once in AM empty stomach and then before each meal. Log in all results and show them to your Prim.MD in 3 days. If any glucose reading is under 80 or above 300 call your Prim MD immidiately. Follow Low glucose instructions for glucose under 80 as instructed.   Special Instructions: If you have smoked or chewed Tobacco  in the last 2 yrs please stop smoking, stop any regular Alcohol  and or any Recreational drug use.  On your next visit with your primary care physician please Get Medicines reviewed and adjusted.  Please request your Prim.MD to go over all Hospital Tests and Procedure/Radiological results at the follow up, please get all Hospital records sent to your Prim MD by signing hospital release before you go home.  If you experience worsening of your admission symptoms, develop shortness of breath, life threatening emergency, suicidal or homicidal thoughts you must seek medical attention immediately by calling 911 or calling your MD immediately  if symptoms less severe.  You Must read complete instructions/literature along with all the possible adverse reactions/side effects for all the Medicines you take and that have been prescribed to you. Take any new Medicines after you have completely understood and accpet all the possible adverse reactions/side effects.         Person Under Monitoring Name: Patrick Luna  Location: [facility] [place] [address]   Infection Prevention Recommendations for Individuals Confirmed to have, or Being Evaluated for, 2019 Novel Coronavirus (COVID-19) Infection Who Receive Care at Home  Individuals who are confirmed to  have, or are being evaluated for, COVID-19 should follow the prevention steps below until a healthcare provider or local or state health department says they can return to normal activities.  Stay home except to get medical care You should restrict activities outside your home, except for getting medical care. Do not go to work, school, or public areas, and do not use public transportation or taxis.  Call ahead before visiting your doctor Before your medical appointment, call the healthcare provider and tell them that you have, or are being evaluated for, COVID-19 infection. This will help the healthcare provider's office take steps to keep other people from getting infected. Ask your healthcare provider to call the local or state health department.  Monitor your symptoms Seek prompt medical attention if your illness is worsening (e.g., difficulty breathing). Before going to your medical appointment, call the healthcare provider and tell them that you have, or are being evaluated for, COVID-19 infection. Ask your healthcare provider to call the local or state health department.  Wear a facemask You should wear a facemask that covers your nose and mouth when you are in the same room with other people and when you visit a healthcare provider. People who live with or visit you should also wear a facemask while they are in the same room with you.  Separate yourself from other people in your home As much as possible, you should stay in a different room from other people in your home. Also, you should use a separate bathroom, if available.  Avoid sharing household items  You should not share dishes, drinking glasses, cups, eating utensils, towels, bedding, or other items with other people in your home. After using these items, you should wash them thoroughly with soap and water.  Cover your coughs and sneezes Cover your mouth and nose with a tissue when you cough or sneeze, or you can cough  or sneeze into your sleeve. Throw used tissues in a lined trash can, and immediately wash your hands with soap and water for at least 20 seconds or use an alcohol-based hand rub.  Wash your [facility] your hands often and thoroughly with soap and water for at least 20 seconds. You can use an alcohol-based hand sanitizer if soap and water are not available and if your hands are not visibly dirty. Avoid touching your eyes, nose, and mouth with unwashed hands.   Prevention Steps for Caregivers and Household Members of Individuals Confirmed to have, or Being Evaluated for, COVID-19 Infection Being Cared for in the Home  If you live with, or provide care at home for, a person confirmed to have, or being evaluated for, COVID-19 infection please follow these guidelines to prevent infection:  Follow healthcare provider's instructions Make sure that you understand and can help the patient follow any healthcare provider instructions for all care.  Provide for the patient's basic needs You should help the patient with basic needs in the home and provide support for getting groceries, prescriptions, and other personal needs.  Monitor the patient's symptoms If they are getting sicker, call his or her medical provider and tell them that the patient has, or is being evaluated for, COVID-19 infection. This will help the healthcare provider's office take steps to keep other people from getting infected. Ask the healthcare provider to call the local or state health department.  Limit the number of people who have contact with the patient  If possible, have only one caregiver for the patient.  Other household members should stay in another home or place of residence. If this is not possible, they should stay  in another room, or be separated from the patient as much as possible. Use a separate bathroom, if available.  Restrict visitors who do not have an essential need to be in the home.  Keep  older adults, very young children, and other sick people away from the patient Keep older adults, very young children, and those who have compromised immune systems or chronic health conditions away from the patient. This includes people with chronic heart, lung, or kidney conditions, diabetes, and cancer.  Ensure good ventilation Make sure that shared spaces in the home have good air flow, such as from an air conditioner or an opened window, weather permitting.  Wash your hands often  Wash your hands often and thoroughly with soap and water for at least 20 seconds. You can use an alcohol based hand sanitizer if soap and water are not available and if your hands are not visibly dirty.  Avoid touching your eyes, nose, and mouth with unwashed hands.  Use disposable paper towels to dry your hands. If not available, use dedicated cloth towels and replace them when they become wet.  Wear a facemask and gloves  Wear a disposable facemask at all times in the room and gloves when you touch or have contact with the patient's blood, body fluids, and/or secretions or excretions, such as sweat, saliva, sputum, nasal mucus, vomit, urine, or feces.  Ensure the mask fits over your nose and mouth tightly, and do  not touch it during use.  Throw out disposable facemasks and gloves after using them. Do not reuse.  Wash your hands immediately after removing your facemask and gloves.  If your personal clothing becomes contaminated, carefully remove clothing and launder. Wash your hands after handling contaminated clothing.  Place all used disposable facemasks, gloves, and other waste in a lined container before disposing them with other household waste.  Remove gloves and wash your hands immediately after handling these items.  Do not share dishes, glasses, or other household items with the patient  Avoid sharing household items. You should not share dishes, drinking glasses, cups, eating utensils, towels,  bedding, or other items with a patient who is confirmed to have, or being evaluated for, COVID-19 infection.  After the person uses these items, you should wash them thoroughly with soap and water.  Wash laundry thoroughly  Immediately remove and wash clothes or bedding that have blood, body fluids, and/or secretions or excretions, such as sweat, saliva, sputum, nasal mucus, vomit, urine, or feces, on them.  Wear gloves when handling laundry from the patient.  Read and follow directions on labels of laundry or clothing items and detergent. In general, wash and dry with the warmest temperatures recommended on the label.  Clean all areas the individual has used often  Clean all touchable surfaces, such as counters, tabletops, doorknobs, bathroom fixtures, toilets, phones, keyboards, tablets, and bedside tables, every day. Also, clean any surfaces that may have blood, body fluids, and/or secretions or excretions on them.  Wear gloves when cleaning surfaces the patient has come in contact with.  Use a diluted bleach solution (e.g., dilute bleach with 1 part bleach and 10 parts water) or a household disinfectant with a label that says EPA-registered for coronaviruses. To make a bleach solution at home, add 1 tablespoon of bleach to 1 quart (4 cups) of water. For a larger supply, add  cup of bleach to 1 gallon (16 cups) of water.  Read labels of cleaning products and follow recommendations provided on product labels. Labels contain instructions for safe and effective use of the cleaning product including precautions you should take when applying the product, such as wearing gloves or eye protection and making sure you have good ventilation during use of the product.  Remove gloves and wash hands immediately after cleaning.  Monitor yourself for signs and symptoms of illness Caregivers and household members are considered close contacts, should monitor their health, and will be asked to  limit movement outside of the home to the extent possible. Follow the monitoring steps for close contacts listed on the symptom monitoring form.   ? If you have additional questions, contact your local health department or call the epidemiologist on call at [phone] (available 24/7). ? This guidance is subject to change. For the most up-to-date guidance from [facility], please refer to their website: [email or web address]

## 2019-10-14 NOTE — Discharge Summary (Signed)
                                                                                 Linda C Raspberry MRN:[id number] DOB: 12/17/1945 DOA: 10/09/2019  PCP: Pickard, Warren T, MD  Admit date: 10/09/2019  Discharge date: 10/14/2019  Admitted From: *Home  Disposition:  Home   Recommendations for Outpatient Follow-up:   Follow up with PCP in 1-2 weeks  PCP Please obtain BMP/CBC, 2 view CXR in 1week,  (see Discharge instructions)   PCP Please follow up on the following pending results:    Home Health: None   Equipment/Devices: 2lit o2 PRN  Consultations: None  Discharge Condition: Stable    CODE STATUS: Full    Diet Recommendation: Heart Healthy Low Carb  Diet Order            Diet heart healthy/carb modified Room service appropriate? Yes; Fluid consistency: Thin  Diet effective now                  Chief Complaint  Patient presents with  . Shortness of Breath     Brief history of present illness from the day of admission and additional interim summary    Patient is a 74 y.o. male with PMHx of HTN, HLD, DM-2-who apparently was diagnosed with COVID-19 approximately 8-9 days prior to this hospitalization-presented to the ED on 8/27 with shortness of breath-found to have acute hypoxic respiratory failure secondary to COVID-19 pneumonia.  COVID-19 vaccinated status: Unvaccinated  Significant Events: 8/22>> ED visit-known Covid +ve-presented with weakness-not hypoxic-no pneumonia on x-ray.  Discharged home. 8/27>> Admit to [facility] for hypoxia due to COVID-19 pneumonia.  Significant studies: 8/22>> chest x-ray: No acute cardiopulmonary disease. 8/27>>Chest x-ray: Views increasing airspace opacities bilaterally consistent with Covid pneumonia.  COVID-19 medications: Steroids: 8/27>> Remdesivir: 8/27>> Baricitinib:                                                                 Hospital  Course   Acute Hypoxic Resp Failure due to Covid 19 Viral pneumonia: Had moderate hypoxemia-he was treated with combination of IV steroids, remdesivir along with Baricitinib, clinically much improved and stable at rest on room air and symptom-free.  Has completed his course of treatment will be discharged home with 2 L nasal cannula oxygen as needed if needed on ambulation.  Follow with PCP in a week..    Recent Labs  Lab 10/09/19 1837 10/10/19 0729 10/10/19 0740 10/11/19 0304 10/12/19 0349 10/13/19 0234  CRP 8.7*  --  9.9* 6.3* 2.4* 1.1*  DDIMER 1.00* 1.00*  --  0.68* 0.50 0.48  FERRITIN 1,027* 790*  --  710*  --   --   BNP  --   --   --  36.7 43.9 67.3  PROCALCITON <0.10  --   --   --   --   --     Hepatic Function Latest Ref Rng & Units 10/13/2019 10/12/2019 10/11/2019    Total Protein 6.5 - 8.1 g/dL 5.5(L) 5.4(L) 5.3(L)  Albumin 3.5 - 5.0 g/dL 2.5(L) 2.4(L) 2.4(L)  AST 15 - 41 U/L 41 47(H) 55(H)  ALT 0 - 44 U/L 48(H) 46(H) 45(H)  Alk Phosphatase 38 - 126 U/L 39 39 35(L)  Total Bilirubin 0.3 - 1.2 mg/dL 0.7 0.7 0.7    Leukopenia: Secondary to COVID-19 infection-PCP to repeat CBC in 7 to 10 days along with CMP.  Transaminitis: Mild-likely secondary to COVID-19-stable for follow-up-no need for further work-up at this time.  HTN:-Continue home regimen.  DM-2 (A1c 7.7 on 08/21/2019):  Continue home regimen.  Obesity with a BMI of 33.  Follow with PCP.   Discharge diagnosis     Principal Problem:   Acute respiratory failure due to COVID-19 [facility]) Active Problems:   Hypercholesteremia   [place] esophagus   Controlled type 2 diabetes mellitus without complication, without long-term current use of insulin (HCC)   Acute hypoxemic respiratory failure due to COVID-19 [facility])    Discharge instructions    Discharge Instructions    Discharge instructions   Complete by: As directed    Follow with Primary MD Susy Frizzle, MD in 7 days   Get CBC, CMP, 2 view Chest X ray  -  checked next visit within 1 week by Primary MD    Activity: As tolerated with Full fall precautions use walker/cane & assistance as needed  Disposition Home    Diet: Heart Healthy - Low Carb  Accuchecks 4 times/day, Once in AM empty stomach and then before each meal. Log in all results and show them to your Prim.MD in 3 days. If any glucose reading is under 80 or above 300 call your Prim MD immidiately. Follow Low glucose instructions for glucose under 80 as instructed.   Special Instructions: If you have smoked or chewed Tobacco  in the last 2 yrs please stop smoking, stop any regular Alcohol  and or any Recreational drug use.  On your next visit with your primary care physician please Get Medicines reviewed and adjusted.  Please request your Prim.MD to go over all Hospital Tests and Procedure/Radiological results at the follow up, please get all Hospital records sent to your Prim MD by signing hospital release before you go home.  If you experience worsening of your admission symptoms, develop shortness of breath, life threatening emergency, suicidal or homicidal thoughts you must seek medical attention immediately by calling 911 or calling your MD immediately  if symptoms less severe.  You Must read complete instructions/literature along with all the possible adverse reactions/side effects for all the Medicines you take and that have been prescribed to you. Take any new Medicines after you have completely understood and accpet all the possible adverse reactions/side effects.   Increase activity slowly   Complete by: As directed    MyChart COVID-19 home monitoring program   Complete by: Oct 14, 2019    Is the patient willing to use the [place] for home monitoring?: Yes   Temperature monitoring   Complete by: Oct 14, 2019    After how many days would you like to receive a notification of this patient's flowsheet entries?: 1      Discharge Medications   Allergies as  of 10/14/2019      Reactions   Ampicillin Rash      Medication List    TAKE these medications   albuterol 108 (90 Base) MCG/ACT inhaler Commonly known as: VENTOLIN HFA Inhale 2 puffs into  the lungs every 6 (six) hours as needed for wheezing or shortness of breath.   aspirin EC 81 MG tablet Take 81 mg by mouth daily.   atorvastatin 20 MG tablet Commonly known as: LIPITOR TAKE 1 TABLET BY MOUTH EVERY DAY   empagliflozin 25 MG Tabs tablet Commonly known as: Jardiance Take 1 tablet (25 mg total) by mouth daily before breakfast.   freestyle lancets Check BS BID - DX: E11.9 What changed:   how much to take  how to take this  when to take this   FREESTYLE LITE test strip Generic drug: glucose blood USE TO TEST BLOOD SUGAR ONCE DAILY What changed: See the new instructions.   lisinopril 10 MG tablet Commonly known as: ZESTRIL TAKE 1 TABLET BY MOUTH EVERY DAY   metFORMIN 500 MG 24 hr tablet Commonly known as: GLUCOPHAGE-XR TAKE 4 TABLETS BY MOUTH EVERY DAY WITH BREAKFAST What changed: See the new instructions.   nitroGLYCERIN 0.4 MG SL tablet Commonly known as: NITROSTAT Place 0.4 mg under the tongue every 5 (five) minutes as needed for chest pain.   vitamin C 1000 MG tablet Take 1,000 mg by mouth daily.   Vitamin D3 25 MCG tablet Commonly known as: Vitamin D Take 2,000 Units by mouth daily.   zinc gluconate 50 MG tablet Take 30 mg by mouth daily.            Durable Medical Equipment  (From admission, onward)         Start     Ordered   10/13/19 0758  For home use only DME oxygen  Once       Question Answer Comment  Length of Need 6 Months   Mode or (Route) Nasal cannula   Liters per Minute 2   Frequency Continuous (stationary and portable oxygen unit needed)   Oxygen conserving device Yes   Oxygen delivery system Gas      10/13/19 0757           Follow-up Information    Susy Frizzle, MD. Schedule an appointment as soon as possible for  a visit in 1 week(s).   Specialty: Family Medicine Contact information: [address] [place] [address] [phone]               Major procedures and Radiology Reports - PLEASE review detailed and final reports thoroughly  -        DG Chest Port 1 View  Result Date: 10/09/2019 CLINICAL DATA:  Cough and shortness of breath, COVID-19 positivity EXAM: PORTABLE CHEST 1 VIEW COMPARISON:  10/04/2019 FINDINGS: Cardiac shadow is enlarged. Aortic calcifications are again seen. Lungs are well aerated but now demonstrate diffuse increased airspace opacities bilaterally consistent with the given clinical history. No bony abnormality is noted. IMPRESSION: Diffuse increase in airspace opacities bilaterally consistent with the given clinical history of COVID-19 positivity. Electronically Signed   By: Inez Catalina M.D.   On: 10/09/2019 19:06   DG Chest Portable 1 View  Result Date: 10/04/2019 CLINICAL DATA:  COVID, shortness of breath EXAM: PORTABLE CHEST 1 VIEW COMPARISON:  Chest x-ray from 2012 FINDINGS: Image rotated to the LEFT. Accounting for this cardiomediastinal contours and hilar structures are normal. Lungs are clear.  No evidence of effusion on frontal view. On limited assessment skeletal structures are unremarkable. IMPRESSION: No acute cardiopulmonary disease. Electronically Signed   By: Zetta Bills M.D.   On: 10/04/2019 13:46    Micro Results  Recent Results (from the past 240 hour(s))  Blood Culture (routine x 2)     Status: None   Collection Time: 10/09/19  6:38 PM   Specimen: BLOOD  Result Value Ref Range Status   Specimen Description BLOOD RIGHT ANTECUBITAL  Final   Special Requests   Final    BOTTLES DRAWN AEROBIC AND ANAEROBIC Blood Culture adequate volume   Culture   Final    NO GROWTH 5 DAYS Performed at [facility], [address] [place], [place] [address]    Report Status 10/14/2019 FINAL  Final  Blood Culture (routine x 2)     Status:  None   Collection Time: 10/09/19  7:15 PM   Specimen: BLOOD  Result Value Ref Range Status   Specimen Description BLOOD LEFT ANTECUBITAL  Final   Special Requests   Final    BOTTLES DRAWN AEROBIC AND ANAEROBIC Blood Culture results may not be optimal due to an inadequate volume of blood received in culture bottles   Culture   Final    NO GROWTH 5 DAYS Performed at [facility], [place] [address], [place], [place] [address]    Report Status 10/14/2019 FINAL  Final    Today   Subjective    Terrian Sentell today has no headache,no chest abdominal pain,no new weakness tingling or numbness, feels much better wants to go home today.     Objective   Blood pressure 121/60, pulse 62, temperature 97.6 F (36.4 C), temperature source Oral, resp. rate 18, height 5' 6" (1.676 m), weight 93.5 kg, SpO2 92 %.   Intake/Output Summary (Last 24 hours) at 10/14/2019 0944 Last data filed at 10/14/2019 0834 Gross per 24 hour  Intake 840 ml  Output 1100 ml  Net -260 ml    Exam  Awake Alert, No new F.N deficits, Normal affect [place].AT,PERRAL Supple Neck,No JVD, No cervical lymphadenopathy appriciated.  Symmetrical Chest wall movement, Good air movement bilaterally, CTAB RRR,No Gallops,Rubs or new Murmurs, No Parasternal Heave +ve B.Sounds, Abd Soft, Non tender, No organomegaly appriciated, No rebound -guarding or rigidity. No Cyanosis, Clubbing or edema, No new Rash or bruise   Data Review   CBC w Diff:  Lab Results  Component Value Date   WBC 11.1 (H) 10/13/2019   HGB 12.8 (L) 10/13/2019   HCT 38.3 (L) 10/13/2019   PLT 273 10/13/2019   LYMPHOPCT 16 10/09/2019   MONOPCT 4 10/09/2019   EOSPCT 0 10/09/2019   BASOPCT 0 10/09/2019    CMP:  Lab Results  Component Value Date   NA 134 (L) 10/13/2019   K 4.5 10/13/2019   CL 101 10/13/2019   CO2 23 10/13/2019   BUN 29 (H) 10/13/2019   CREATININE 0.89 10/13/2019   CREATININE 0.90 04/15/2019   PROT 5.5 (L) 10/13/2019   ALBUMIN 2.5 (L)  10/13/2019   BILITOT 0.7 10/13/2019   ALKPHOS 39 10/13/2019   AST 41 10/13/2019   ALT 48 (H) 10/13/2019  .   Total Time in preparing paper work, data evaluation and todays exam - 41 minutes  Lala Lund M.D on 10/14/2019 at 9:44 AM  [facility]   Office  [phone]

## 2019-10-16 ENCOUNTER — Telehealth: Payer: Self-pay

## 2019-10-16 NOTE — Telephone Encounter (Signed)
Mrs. Ambrosini called [name] needs refill on his oxygen, he came home from hospital with a portable and an tank, the tank is what's empty. Needs refill for oxygen.

## 2019-10-16 NOTE — Telephone Encounter (Signed)
Received following message from [employer]: At this time we are unable to accept this order due to the [employer] on DME. Thanks   Call placed to [facility] to determine if DME order would be able to be filled. Was advised that [employer] has already delivered O2 concentrator to patient.   Call placed to [employer]. Was advised that patient has been delivered a portable oxygen concentrator for home and away use, and an emergency use tank.   Call placed to family. Education given on how to use concentrator and that tank is only used for emergencies.   Patient wife and daughter verbalized understanding.

## 2019-10-16 NOTE — Telephone Encounter (Signed)
We are going to have to call a oxygen provider and arrange oxygen.  We need to know how many liters/min, who was he scheduled to follow up with from hospital ([facility], [facility]?)  We have no records to send, what do we do?

## 2019-10-16 NOTE — Telephone Encounter (Addendum)
In Patient Hospital Oxygen Therapy Testing performed by Pennelope Bracken, RN.   SATURATION QUALIFICATIONS: (This note is used to comply with regulatory documentation for home oxygen)   Patient Saturations on Room Air at Rest = 98%   Patient Saturations on Room Air while Ambulating = 84%   Patient Saturations on 2 Liters of oxygen while Ambulating = 93%   Patient needs at home oxygen because he is not able to maintain a saturation above 88% without oxygen.

## 2019-10-16 NOTE — Telephone Encounter (Signed)
Order placed for home O2 therapy with [facility] [facility] via [facility].

## 2019-10-20 ENCOUNTER — Other Ambulatory Visit: Payer: Self-pay | Admitting: Family Medicine

## 2019-10-22 ENCOUNTER — Other Ambulatory Visit: Payer: Self-pay

## 2019-10-22 ENCOUNTER — Ambulatory Visit (INDEPENDENT_AMBULATORY_CARE_PROVIDER_SITE_OTHER): Payer: PPO | Admitting: Family Medicine

## 2019-10-22 VITALS — BP 110/70 | HR 100 | Temp 98.7°F | Ht 66.0 in | Wt 205.0 lb

## 2019-10-22 DIAGNOSIS — E7800 Pure hypercholesterolemia, unspecified: Secondary | ICD-10-CM

## 2019-10-22 DIAGNOSIS — U071 COVID-19: Secondary | ICD-10-CM

## 2019-10-22 DIAGNOSIS — E1169 Type 2 diabetes mellitus with other specified complication: Secondary | ICD-10-CM | POA: Diagnosis not present

## 2019-10-22 DIAGNOSIS — I10 Essential (primary) hypertension: Secondary | ICD-10-CM | POA: Diagnosis not present

## 2019-10-22 DIAGNOSIS — J9601 Acute respiratory failure with hypoxia: Secondary | ICD-10-CM | POA: Diagnosis not present

## 2019-10-22 NOTE — Progress Notes (Signed)
Subjective:    Patient ID: Patrick Luna, male    DOB: 11-May-1945, 74 y.o.   MRN: [id number]  HPI   Patient was admitted to the hospital due to [place] date: 10/09/2019  Discharge date: 10/14/2019  Admitted From: *Home  Disposition:  Home   Recommendations for Outpatient Follow-up:   Follow up with PCP in 1-2 weeks  PCP Please obtain BMP/CBC, 2 view CXR in 1week,  (see Discharge instructions)    Home Health: None   Equipment/Devices: 2lit o2 PRN    Brief history of present illness from the day of admission and additional interim summary    Patient is a73 y.o.malewith PMHx of HTN, HLD, DM-2-who apparently was diagnosed with COVID-19 approximately 8-9 days prior to this hospitalization-presented to the ED on 8/27 with shortness of breath-found to have acute hypoxic respiratory failure secondary to COVID-19 pneumonia.  COVID-19 vaccinated status: Unvaccinated  Significant Events: 8/22>> ED visit-knownCovid +ve-presented with weakness-not hypoxic-no pneumonia on x-ray. Discharged home. 8/27>> Admit to [facility] for hypoxia due to COVID-19 pneumonia.  Significant studies: 8/22>> chest x-ray: No acute cardiopulmonary disease. 8/27>>Chest x-ray:Views increasing airspace opacities bilaterally consistent with Covid pneumonia.  COVID-19 medications: Steroids: 8/27>> Remdesivir: 8/27>> Baricitinib:                                                                 Hospital Course   Acute Hypoxic Resp Failure due to Covid 19 Viral pneumonia: Had moderate hypoxemia-he was treated with combination of IV steroids, remdesivir along with Baricitinib, clinically much improved and stable at rest on room air and symptom-free.  Has completed his course of treatment will be discharged home with 2 L nasal cannula oxygen as needed if needed on ambulation.  Follow with PCP in a week..   10/22/19 Patient is here today for follow-up.  All things considered, the patient  seems to be doing remarkably well given what he has been through.  He is 95% on room air today.  He is no longer requiring oxygen at home.  He states that his oxygen level is consistently running between 93 and 95.  He denies any chest pain.  He denies any pleurisy.  He denies any hemoptysis.  He does have a tickle cough that affects him frequently but this is more of a nuisance than anything else.  He does have some mild swelling in both feet distal to the ankle.  There is no pitting edema in his lower legs.  There is no evidence of erythema or swelling to suggest a DVT.  Patient lost weight while in the hospital due to his illness however his appetite is recovering.  He denies any nausea or vomiting.  He denies any abdominal pain.  He denies any dysuria or hematuria.  His last hemoglobin A1c was 2 months ago and was elevated at 7.7.  Given his current situation having recently been on steroids and battling a life-threatening illness, I see no reason to repeat that today.  He is compliant with Jardiance and Metformin Past Medical History:  Diagnosis Date  . Chronic sinusitis   . Diabetes mellitus without complication ([place])   . GERD (gastroesophageal reflux disease)   . Hepatic steatosis   . Hypercholesterolemia   . [place] esophagus  [facility], botox, dilation, isosorbide   Past Surgical History:  Procedure Laterality Date  . COLONOSCOPY  2007  . CYSTECTOMY     BENIGN CYST REMOVAL FROM RIGHT SHOULDER  . ESOPHAGEAL MANOMETRY N/A 02/02/2013   Procedure: ESOPHAGEAL MANOMETRY (EM);  Surgeon: Cleotis Nipper, MD;  Location: [facility];  Service: Endoscopy;  Laterality: N/A;  . 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 HAND DEBRIDEMENT   2009   left   Current Outpatient Medications on File Prior to Visit  Medication Sig Dispense Refill  . albuterol (VENTOLIN HFA) 108 (90 Base) MCG/ACT inhaler Inhale 2 puffs into the lungs every 6 (six) hours as needed for wheezing or shortness of breath. 6.7 g 0  . Ascorbic Acid (VITAMIN C) 1000 MG tablet Take 1,000 mg by mouth daily.    Marland Kitchen aspirin EC 81 MG tablet Take 81 mg by mouth daily.    Marland Kitchen atorvastatin (LIPITOR) 20 MG tablet TAKE 1 TABLET BY MOUTH EVERY DAY (Patient taking differently: Take 20 mg by mouth daily. ) 90 tablet 3  . empagliflozin (JARDIANCE) 25 MG TABS tablet Take 1 tablet (25 mg total) by mouth daily before breakfast. 30 tablet 3  . FREESTYLE LITE test strip USE TO TEST BLOOD SUGAR ONCE DAILY (Patient taking differently: 1 each by Other route daily. ) 100 strip 6  . Lancets (FREESTYLE) lancets Check BS BID - DX: E11.9 (Patient taking differently: 1 each by Other route as directed. Check BS BID - DX: E11.9) 200 each 3  . lisinopril (ZESTRIL) 10 MG tablet TAKE 1 TABLET BY MOUTH EVERY DAY (Patient taking differently: Take 10 mg by mouth daily. ) 90 tablet 3  . metFORMIN (GLUCOPHAGE-XR) 500 MG 24 hr tablet TAKE 4 TABLETS BY MOUTH EVERY DAY WITH BREAKFAST 120 tablet 1  . nitroGLYCERIN (NITROSTAT) 0.4 MG SL tablet Place 0.4 mg under the tongue every 5 (five) minutes as needed for chest pain.    . Vitamin D3 (VITAMIN D) 25 MCG tablet Take 2,000 Units by mouth daily.    Marland Kitchen zinc gluconate 50 MG tablet Take 30 mg by mouth daily.     No current facility-administered medications on file prior to visit.   Allergies  Allergen Reactions  . Ampicillin Rash   Social History   Socioeconomic History  . Marital status: Married    Spouse name: Not on file  . Number of children: Not on file  . Years of education: Not on file  . Highest education level: Not on file  Occupational History  . Not on file  Tobacco Use  . Smoking status: Former Smoker    Types: Cigarettes    Quit date:  11/27/1968    Years since quitting: 50.9  . Smokeless tobacco: Never Used  . Tobacco comment: QUIT 1970  Vaping Use  . Vaping Use: Never used  Substance and Sexual Activity  . Alcohol use: No  . Drug use: No  . Sexual activity: Not Currently  Other Topics Concern  . Not on file  Social History Narrative  . Not on file   Social Determinants of Health   Financial Resource Strain:   . Difficulty of Paying Living Expenses: Not on file  Food Insecurity:   . Worried About Charity fundraiser in the  Last Year: Not on file  . Ran Out of Food in the Last Year: Not on file  Transportation Needs:   . Lack of Transportation (Medical): Not on file  . Lack of Transportation (Non-Medical): Not on file  Physical Activity:   . Days of Exercise per Week: Not on file  . Minutes of Exercise per Session: Not on file  Stress:   . Feeling of Stress : Not on file  Social Connections:   . Frequency of Communication with Friends and Family: Not on file  . Frequency of Social Gatherings with Friends and Family: Not on file  . Attends Religious Services: Not on file  . Active Member of Clubs or Organizations: Not on file  . Attends Archivist Meetings: Not on file  . Marital Status: Not on file  Intimate Partner Violence:   . Fear of Current or Ex-Partner: Not on file  . Emotionally Abused: Not on file  . Physically Abused: Not on file  . Sexually Abused: Not on file      Review of Systems  All other systems reviewed and are negative.      Objective:   Physical Exam Vitals reviewed.  Constitutional:      Appearance: He is well-developed.  Neck:     Thyroid: No thyromegaly.     Vascular: No JVD.  Cardiovascular:     Rate and Rhythm: Normal rate and regular rhythm.     Heart sounds: Normal heart sounds. No murmur heard.   Pulmonary:     Effort: Pulmonary effort is normal. No respiratory distress.     Breath sounds: Normal breath sounds. No wheezing or rales.  Abdominal:       General: Bowel sounds are normal. There is no distension.     Palpations: Abdomen is soft.     Tenderness: There is no abdominal tenderness. There is no guarding or rebound.           Assessment & Plan:  Acute hypoxemic respiratory failure due to COVID-19 [facility]) - Plan: CBC with Differential/Platelet, COMPLETE METABOLIC PANEL WITH GFR  Hypercholesteremia  Benign essential HTN  Type 2 diabetes mellitus with other specified complication, without long-term current use of insulin (HCC)  Patient's lungs sound remarkably clear given his chest x-ray from August 27.  There are no wheezes or crackles appreciated on exam.  Aside from an occasional aggravating cough, the patient seems to be doing much better and be approaching his baseline.  His biggest issue as I see right now is deconditioning due to the severity of the illness.  He does feel weak.  Unfortunately his mother passed away while he was in the hospital.  Her funeral was yesterday.  Therefore he has not been able to start any type of exercise to try to build back his strength.  Otherwise he is doing well.  I have recommended that he discontinue oxygen and gradually advance activity as tolerated.  I will check a CBC and a CMP today.  His lungs sound completely clear so I see no reason to repeat an x-ray today.

## 2019-10-23 LAB — CBC WITH DIFFERENTIAL/PLATELET
Absolute Monocytes: 828 cells/uL (ref 200–950)
Basophils Absolute: 21 cells/uL (ref 0–200)
Basophils Relative: 0.3 %
Eosinophils Absolute: 138 cells/uL (ref 15–500)
Eosinophils Relative: 2 %
HCT: 41.8 % (ref 38.5–50.0)
Hemoglobin: 13.8 g/dL (ref 13.2–17.1)
Lymphs Abs: 1352 cells/uL (ref 850–3900)
MCH: 29.9 pg (ref 27.0–33.0)
MCHC: 33 g/dL (ref 32.0–36.0)
MCV: 90.7 fL (ref 80.0–100.0)
MPV: 11.4 fL (ref 7.5–12.5)
Monocytes Relative: 12 %
Neutro Abs: 4561 cells/uL (ref 1500–7800)
Neutrophils Relative %: 66.1 %
Platelets: 300 10*3/uL (ref 140–400)
RBC: 4.61 10*6/uL (ref 4.20–5.80)
RDW: 13.4 % (ref 11.0–15.0)
Total Lymphocyte: 19.6 %
WBC: 6.9 10*3/uL (ref 3.8–10.8)

## 2019-10-23 LAB — COMPLETE METABOLIC PANEL WITH GFR
AG Ratio: 1.4 (calc) (ref 1.0–2.5)
ALT: 41 U/L (ref 9–46)
AST: 25 U/L (ref 10–35)
Albumin: 3.6 g/dL (ref 3.6–5.1)
Alkaline phosphatase (APISO): 60 U/L (ref 35–144)
BUN: 10 mg/dL (ref 7–25)
CO2: 27 mmol/L (ref 20–32)
Calcium: 8.9 mg/dL (ref 8.6–10.3)
Chloride: 104 mmol/L (ref 98–110)
Creat: 0.8 mg/dL (ref 0.70–1.18)
GFR, Est African American: 103 mL/min/{1.73_m2} (ref >=60)
GFR, Est Non African American: 89 mL/min/{1.73_m2} (ref >=60)
Globulin: 2.5 g/dL (calc) (ref 1.9–3.7)
Glucose, Bld: 110 mg/dL — ABNORMAL HIGH (ref 65–99)
Potassium: 4.3 mmol/L (ref 3.5–5.3)
Sodium: 139 mmol/L (ref 135–146)
Total Bilirubin: 0.7 mg/dL (ref 0.2–1.2)
Total Protein: 6.1 g/dL (ref 6.1–8.1)

## 2019-10-26 ENCOUNTER — Telehealth: Payer: Self-pay | Admitting: Family Medicine

## 2019-10-26 NOTE — Telephone Encounter (Signed)
Patient stopped by to let us know that the oxygent tank issued to him from [facility] was through [facility] their number is [phone]. He was instructed by you to let him know who the oxygen was supplied from. He no longer needs the oxygen.  CB# [phone]

## 2019-10-27 NOTE — Telephone Encounter (Signed)
Order faxed to [facility] to D/C

## 2019-10-27 NOTE — Telephone Encounter (Signed)
Ok to d/c oxygen

## 2019-11-02 ENCOUNTER — Telehealth: Payer: Self-pay | Admitting: Family Medicine

## 2019-11-02 NOTE — Telephone Encounter (Signed)
Pt stop by the office [facility] hasn't pick his oxygen. [facility] said that never received then order to pick oxygen the number is([employer])  [phone]

## 2019-11-09 DIAGNOSIS — H25811 Combined forms of age-related cataract, right eye: Secondary | ICD-10-CM | POA: Diagnosis not present

## 2019-11-09 DIAGNOSIS — E119 Type 2 diabetes mellitus without complications: Secondary | ICD-10-CM | POA: Diagnosis not present

## 2019-11-09 DIAGNOSIS — Z961 Presence of intraocular lens: Secondary | ICD-10-CM | POA: Diagnosis not present

## 2019-11-09 LAB — HM DIABETES EYE EXAM

## 2019-11-12 DIAGNOSIS — U071 COVID-19: Secondary | ICD-10-CM | POA: Diagnosis not present

## 2019-11-12 NOTE — Telephone Encounter (Signed)
Prescription faxed to [facility] again.

## 2019-12-03 ENCOUNTER — Other Ambulatory Visit: Payer: Self-pay | Admitting: Family Medicine

## 2019-12-04 DIAGNOSIS — H25811 Combined forms of age-related cataract, right eye: Secondary | ICD-10-CM | POA: Diagnosis not present

## 2019-12-13 DIAGNOSIS — U071 COVID-19: Secondary | ICD-10-CM | POA: Diagnosis not present

## 2019-12-26 ENCOUNTER — Other Ambulatory Visit: Payer: Self-pay | Admitting: Family Medicine

## 2020-01-04 ENCOUNTER — Other Ambulatory Visit: Payer: Self-pay

## 2020-01-04 ENCOUNTER — Other Ambulatory Visit: Payer: Self-pay | Admitting: Family Medicine

## 2020-01-04 ENCOUNTER — Telehealth: Payer: Self-pay | Admitting: Family Medicine

## 2020-01-04 MED ORDER — METFORMIN HCL ER 500 MG PO TB24: ORAL_TABLET | ORAL | 1 refills | Status: DC

## 2020-01-04 NOTE — Telephone Encounter (Signed)
Refill Metformin.

## 2020-01-04 NOTE — Telephone Encounter (Signed)
Patient refill sent to pharmacy

## 2020-01-12 DIAGNOSIS — U071 COVID-19: Secondary | ICD-10-CM | POA: Diagnosis not present

## 2020-01-22 ENCOUNTER — Ambulatory Visit: Payer: Self-pay | Admitting: Pharmacist

## 2020-01-22 NOTE — Chronic Care Management (AMB) (Signed)
Chronic Care Management   Follow Up Note   01/25/2020 Name: Patrick Luna MRN: [id number] DOB: 08-29-1945  Referred by: Susy Frizzle, MD Reason for referral : No chief complaint on file.   KASHMERE STAFFA is a 74 y.o. year old male who is a primary care patient of Pickard, Cammie Mcgee, MD. The CCM team was consulted for assistance with chronic disease management and care coordination needs.    Review of patient status, including review of consultants reports, relevant laboratory and other test results, and collaboration with appropriate care team members and the patient's provider was performed as part of comprehensive patient evaluation and provision of chronic care management services.    SDOH (Social Determinants of Health) assessments performed: No See Care Plan activities for detailed interventions related to [facility])     Outpatient Encounter Medications as of 01/22/2020  Medication Sig Note  . albuterol (VENTOLIN HFA) 108 (90 Base) MCG/ACT inhaler Inhale 2 puffs into the lungs every 6 (six) hours as needed for wheezing or shortness of breath.   . Ascorbic Acid (VITAMIN C) 1000 MG tablet Take 1,000 mg by mouth daily.   Marland Kitchen aspirin EC 81 MG tablet Take 81 mg by mouth daily.   Marland Kitchen atorvastatin (LIPITOR) 20 MG tablet TAKE 1 TABLET BY MOUTH EVERY DAY (Patient taking differently: Take 20 mg by mouth daily. ) 10/09/2019: #90 filled 09/10/2019 [facility] per external pharmacy records  . FREESTYLE LITE test strip USE TO TEST BLOOD SUGAR ONCE DAILY (Patient taking differently: 1 each by Other route daily. )   . JARDIANCE 25 MG TABS tablet TAKE 1 TABLET (25 MG TOTAL) BY MOUTH DAILY BEFORE BREAKFAST.   Marland Kitchen Lancets (FREESTYLE) lancets Check BS BID - DX: E11.9 (Patient taking differently: 1 each by Other route as directed. Check BS BID - DX: E11.9)   . lisinopril (ZESTRIL) 10 MG tablet TAKE 1 TABLET BY MOUTH EVERY DAY (Patient taking differently: Take 10 mg by mouth daily. ) 10/09/2019: #90 filled  09/10/2019 [facility] per external pharmacy records  . metFORMIN (GLUCOPHAGE-XR) 500 MG 24 hr tablet TAKE 4 TABLETS BY MOUTH EVERY DAY WITH BREAKFAST   . nitroGLYCERIN (NITROSTAT) 0.4 MG SL tablet Place 0.4 mg under the tongue every 5 (five) minutes as needed for chest pain.   . Vitamin D3 (VITAMIN D) 25 MCG tablet Take 2,000 Units by mouth daily.   Marland Kitchen zinc gluconate 50 MG tablet Take 30 mg by mouth daily.    No facility-administered encounter medications on file as of 01/22/2020.  Reviewed chart ahead of call regarding diabetes control and monitoring.  10/04/2019 - Unfortunately patient was diagnosed with COVID-19 and hospitalized his mother also passed away while he was in the hospital.  Patient is trying to build back his strength and wean off oxygen, lungs are completely clear on exam today.  Recent Relevant Labs: Lab Results  Component Value Date/Time   HGBA1C 7.7 (H) 08/21/2019 02:11 PM   HGBA1C 7.8 (H) 04/15/2019 09:53 AM   HGBA1C 5.8 (H) 01/14/2013 10:26 AM   MICROALBUR 0.7 04/15/2019 09:53 AM   MICROALBUR 0.8 09/06/2017 03:21 PM    Kidney Function Lab Results  Component Value Date/Time   CREATININE 0.80 10/22/2019 02:24 PM   CREATININE 0.89 10/13/2019 02:34 AM   CREATININE 1.00 10/12/2019 03:49 AM   CREATININE 0.90 04/15/2019 09:53 AM   GFRNONAA 89 10/22/2019 02:24 PM   GFRAA 103 10/22/2019 02:24 PM    . Current antihyperglycemic regimen:  o Jardiance 25mg  daily o  Metformin XR 500mg  4 tablets with breakfast . Have there been any recent hospitalizations or ED visits since last visit with CPP? Yes  o Patient was hospitalized in August with COVID-19.  Adherence Review: Is the patient currently on a STATIN medication? Yes Is the patient currently on ACE/ARB medication? Yes Does the patient have >5 day gap between last estimated fill dates? No  Unfortunately unable to reach patient for the third time today to assess diabetes control and management.  Will move follow up to the  next month and contact accordingly.  Beverly Milch, PharmD Clinical Pharmacist [facility] [phone]

## 2020-01-26 ENCOUNTER — Other Ambulatory Visit: Payer: Self-pay | Admitting: Family Medicine

## 2020-01-28 DIAGNOSIS — L821 Other seborrheic keratosis: Secondary | ICD-10-CM | POA: Diagnosis not present

## 2020-01-28 DIAGNOSIS — Z85828 Personal history of other malignant neoplasm of skin: Secondary | ICD-10-CM | POA: Diagnosis not present

## 2020-02-11 ENCOUNTER — Telehealth: Payer: Self-pay | Admitting: Pharmacist

## 2020-02-11 NOTE — Progress Notes (Signed)
Verified Adherence Gap Information. Per insurance data, the patient is compliant with the CHOL medication at 100%, the DM medication at 100% and HTN medication at 90-99%. Patient total gaps equaled to 3.   Patrick Luna, RMA Clinical Pharmacist Assistant [phone]

## 2020-02-12 DIAGNOSIS — U071 COVID-19: Secondary | ICD-10-CM | POA: Diagnosis not present

## 2020-02-24 ENCOUNTER — Other Ambulatory Visit: Payer: Self-pay | Admitting: Family Medicine

## 2020-02-29 ENCOUNTER — Other Ambulatory Visit: Payer: Self-pay | Admitting: Family Medicine

## 2020-03-05 ENCOUNTER — Other Ambulatory Visit: Payer: Self-pay | Admitting: Family Medicine

## 2020-03-14 DIAGNOSIS — U071 COVID-19: Secondary | ICD-10-CM | POA: Diagnosis not present

## 2020-04-11 DIAGNOSIS — U071 COVID-19: Secondary | ICD-10-CM | POA: Diagnosis not present

## 2020-04-27 ENCOUNTER — Other Ambulatory Visit: Payer: Self-pay | Admitting: Family Medicine

## 2020-05-01 ENCOUNTER — Other Ambulatory Visit: Payer: Self-pay | Admitting: Family Medicine

## 2020-05-02 DIAGNOSIS — L570 Actinic keratosis: Secondary | ICD-10-CM | POA: Diagnosis not present

## 2020-05-02 DIAGNOSIS — C44311 Basal cell carcinoma of skin of nose: Secondary | ICD-10-CM | POA: Diagnosis not present

## 2020-05-02 DIAGNOSIS — D2262 Melanocytic nevi of left upper limb, including shoulder: Secondary | ICD-10-CM | POA: Diagnosis not present

## 2020-05-02 DIAGNOSIS — D2261 Melanocytic nevi of right upper limb, including shoulder: Secondary | ICD-10-CM | POA: Diagnosis not present

## 2020-05-02 DIAGNOSIS — L821 Other seborrheic keratosis: Secondary | ICD-10-CM | POA: Diagnosis not present

## 2020-05-02 DIAGNOSIS — C4441 Basal cell carcinoma of skin of scalp and neck: Secondary | ICD-10-CM | POA: Diagnosis not present

## 2020-05-02 DIAGNOSIS — Z85828 Personal history of other malignant neoplasm of skin: Secondary | ICD-10-CM | POA: Diagnosis not present

## 2020-05-02 DIAGNOSIS — D225 Melanocytic nevi of trunk: Secondary | ICD-10-CM | POA: Diagnosis not present

## 2020-05-02 DIAGNOSIS — L820 Inflamed seborrheic keratosis: Secondary | ICD-10-CM | POA: Diagnosis not present

## 2020-05-02 DIAGNOSIS — D044 Carcinoma in situ of skin of scalp and neck: Secondary | ICD-10-CM | POA: Diagnosis not present

## 2020-05-02 DIAGNOSIS — D485 Neoplasm of uncertain behavior of skin: Secondary | ICD-10-CM | POA: Diagnosis not present

## 2020-05-12 DIAGNOSIS — U071 COVID-19: Secondary | ICD-10-CM | POA: Diagnosis not present

## 2020-05-30 ENCOUNTER — Other Ambulatory Visit: Payer: Self-pay | Admitting: Family Medicine

## 2020-05-30 DIAGNOSIS — Z85828 Personal history of other malignant neoplasm of skin: Secondary | ICD-10-CM | POA: Diagnosis not present

## 2020-05-30 DIAGNOSIS — C44311 Basal cell carcinoma of skin of nose: Secondary | ICD-10-CM | POA: Diagnosis not present

## 2020-05-31 ENCOUNTER — Telehealth: Payer: Self-pay | Admitting: Family Medicine

## 2020-05-31 NOTE — Telephone Encounter (Signed)
Patient declined the Medicare Wellness Visit with [facility].  Stated that he is going through a lot at the moment and would like to wait about 6 months before scheduling

## 2020-06-06 ENCOUNTER — Telehealth: Payer: Self-pay | Admitting: Pharmacist

## 2020-06-06 NOTE — Progress Notes (Addendum)
Chronic Care Management Pharmacy Assistant   Name: Patrick Luna  MRN: [id number] DOB: 03-21-1945  Reason for Encounter: Disease State For DM.   Conditions to be addressed/monitored:  GERD, HLD, DM II.  Recent office visits:  None since 02/11/20  Recent consult visits:  None since 02/11/20  Hospital visits:  None since 02/11/20  Medications: Outpatient Encounter Medications as of 06/06/2020  Medication Sig Note   albuterol (VENTOLIN HFA) 108 (90 Base) MCG/ACT inhaler Inhale 2 puffs into the lungs every 6 (six) hours as needed for wheezing or shortness of breath.    Ascorbic Acid (VITAMIN C) 1000 MG tablet Take 1,000 mg by mouth daily.    aspirin EC 81 MG tablet Take 81 mg by mouth daily.    atorvastatin (LIPITOR) 20 MG tablet TAKE 1 TABLET BY MOUTH EVERY DAY (Patient taking differently: Take 20 mg by mouth daily. ) 10/09/2019: #90 filled [facility]/10/2019 [facility] records   FREESTYLE LITE test strip USE TO TEST BLOOD SUGAR ONCE DAILY    JARDIANCE 25 MG TABS tablet TAKE 1 TABLET BY MOUTH DAILY BEFORE BREAKFAST.    Lancets (FREESTYLE) lancets Check BS BID - DX: E11.9 (Patient taking differently: 1 each by Other route as directed. Check BS BID - DX: E11.9)    lisinopril (ZESTRIL) 10 MG tablet TAKE 1 TABLET BY MOUTH EVERY DAY    metFORMIN (GLUCOPHAGE-XR) 500 MG 24 hr tablet TAKE 4 TABLETS BY MOUTH EVERY DAY WITH BREAKFAST    nitroGLYCERIN (NITROSTAT) 0.4 MG SL tablet Place 0.4 mg under the tongue every 5 (five) minutes as needed for chest pain.    Vitamin D3 (VITAMIN D) 25 MCG tablet Take 2,000 Units by mouth daily.    zinc gluconate 50 MG tablet Take 30 mg by mouth daily.    No facility-administered encounter medications on file as of 06/06/2020.   Recent Relevant Labs: Lab Results  Component Value Date/Time   HGBA1C 7.7 (H) 08/21/2019 02:11 PM   HGBA1C 7.8 (H) 04/15/2019 09:53 AM   HGBA1C 5.8 (H) 01/14/2013 10:26 AM   MICROALBUR 0.7 04/15/2019 09:53 AM    MICROALBUR 0.8 09/06/2017 03:21 PM    Kidney Function Lab Results  Component Value Date/Time   CREATININE 0.80 10/22/2019 02:24 PM   CREATININE 0.89 10/13/2019 02:34 AM   CREATININE 1.00 10/12/2019 03:49 AM   CREATININE 0.90 04/15/2019 09:53 AM   GFRNONAA 89 10/22/2019 02:24 PM   GFRAA 103 10/22/2019 02:24 PM    Current antihyperglycemic regimen:  Jardiance 25 mg 1 daily before breakfast Metformin 500 mg 4 tablets daily with breakfast.  What recent interventions/DTPs have been made to improve glycemic control:  None  Have there been any recent hospitalizations or ED visits since last visit with CPP? Patient stated no.  Patient denies hypoglycemic symptoms, including None   Patient denies hyperglycemic symptoms, including none   How often are you checking your blood sugar? Patient stated  once daily   What are your blood sugars ranging?  Patient stated his blood sugars are ranging between 100-115.  During the week, how often does your blood glucose drop below 70? Patient stated Never   Are you checking your feet daily/regularly?  Patient stated he checks his feet regularly.   Adherence Review: Is the patient currently on a STATIN medication? Atorvastatin 20 mg   Is the patient currently on ACE/ARB medication? Lisinopril 10 mg  Does the patient have >5 day gap between last estimated fill dates? Per misc rpts,  no.  Star Rating Drugs: Atorvastatin 20 mg 90 DS 04/05/20, Jardiance 25 mg 30 DS 06/03/20, Lisinopril 10 mg 90 DS 05/24/20, Metformin 500 mg 30 DS 05/30/20  Follow-Up:Pharmacist Review   Charlann Lange, RMA Clinical Pharmacist Assistant [phone]  5 minutes spent in review, coordination, and documentation.  Reviewed by: Beverly Milch, PharmD Clinical Pharmacist [facility] [phone]

## 2020-06-20 DIAGNOSIS — U071 COVID-19: Secondary | ICD-10-CM | POA: Diagnosis not present

## 2020-06-21 ENCOUNTER — Other Ambulatory Visit: Payer: Self-pay | Admitting: Family Medicine

## 2020-06-28 DIAGNOSIS — E119 Type 2 diabetes mellitus without complications: Secondary | ICD-10-CM | POA: Diagnosis not present

## 2020-06-28 DIAGNOSIS — Z961 Presence of intraocular lens: Secondary | ICD-10-CM | POA: Diagnosis not present

## 2020-06-28 LAB — HM DIABETES EYE EXAM

## 2020-07-07 ENCOUNTER — Other Ambulatory Visit: Payer: Self-pay | Admitting: Family Medicine

## 2020-07-14 ENCOUNTER — Encounter: Payer: Self-pay | Admitting: Family Medicine

## 2020-07-14 DIAGNOSIS — E119 Type 2 diabetes mellitus without complications: Secondary | ICD-10-CM | POA: Diagnosis not present

## 2020-07-21 DIAGNOSIS — U071 COVID-19: Secondary | ICD-10-CM | POA: Diagnosis not present

## 2020-08-20 ENCOUNTER — Other Ambulatory Visit: Payer: Self-pay | Admitting: Family Medicine

## 2020-08-20 DIAGNOSIS — U071 COVID-19: Secondary | ICD-10-CM | POA: Diagnosis not present

## 2020-09-12 ENCOUNTER — Telehealth: Payer: Self-pay | Admitting: Pharmacist

## 2020-09-12 NOTE — Progress Notes (Addendum)
Chronic Care Management Pharmacy Assistant   Name: Patrick Luna  MRN: [id number] DOB: 11-27-1945  Reason for Encounter: Disease State For DM.   Conditions to be addressed/monitored: GERD, HLD, DM II.  Recent office visits:  None since 06/06/20  Recent consult visits:  06/28/20 Ophthalmology Patrick Luna, Patrick Luna. No information given.  Hospital visits:  None since 06/06/20  Medications: Outpatient Encounter Medications as of 09/12/2020  Medication Sig   albuterol (VENTOLIN HFA) 108 (90 Base) MCG/ACT inhaler Inhale 2 puffs into the lungs every 6 (six) hours as needed for wheezing or shortness of breath.   Ascorbic Acid (VITAMIN C) 1000 MG tablet Take 1,000 mg by mouth daily.   aspirin EC 81 MG tablet Take 81 mg by mouth daily.   atorvastatin (LIPITOR) 20 MG tablet TAKE 1 TABLET BY MOUTH EVERY DAY   FREESTYLE LITE test strip USE TO TEST BLOOD SUGAR ONCE DAILY   JARDIANCE 25 MG TABS tablet TAKE 1 TABLET BY MOUTH DAILY BEFORE BREAKFAST.   Lancets (FREESTYLE) lancets Check BS BID - DX: E11.9 (Patient taking differently: 1 each by Other route as directed. Check BS BID - DX: E11.9)   lisinopril (ZESTRIL) 10 MG tablet TAKE 1 TABLET BY MOUTH EVERY DAY   metFORMIN (GLUCOPHAGE-XR) 500 MG 24 hr tablet TAKE 4 TABLETS BY MOUTH EVERY DAY WITH BREAKFAST   nitroGLYCERIN (NITROSTAT) 0.4 MG SL tablet Place 0.4 mg under the tongue every 5 (five) minutes as needed for chest pain.   Vitamin D3 (VITAMIN D) 25 MCG tablet Take 2,000 Units by mouth daily.   zinc gluconate 50 MG tablet Take 30 mg by mouth daily.   No facility-administered encounter medications on file as of 09/12/2020.    Recent Relevant Labs: Lab Results  Component Value Date/Time   HGBA1C 7.7 (H) 08/21/2019 02:11 PM   HGBA1C 7.8 (H) 04/15/2019 09:53 AM   HGBA1C 5.8 (H) 01/14/2013 10:26 AM   MICROALBUR 0.7 04/15/2019 09:53 AM   MICROALBUR 0.8 09/06/2017 03:21 PM    Kidney Function Lab Results  Component Value Date/Time    CREATININE 0.80 10/22/2019 02:24 PM   CREATININE 0.89 10/13/2019 02:34 AM   CREATININE 1.00 10/12/2019 03:49 AM   CREATININE 0.90 04/15/2019 09:53 AM   GFRNONAA 89 10/22/2019 02:24 PM   GFRAA 103 10/22/2019 02:24 PM    Current antihyperglycemic regimen:  Jardiance 25 mg 1 daily before breakfast Metformin 500 mg 4 tablets daily with breakfast.  What recent interventions/DTPs have been made to improve glycemic control:  None.  Have there been any recent hospitalizations or ED visits since last visit with CPP? Patient stated no.  Patient denies hypoglycemic symptoms, including None  Patient denies hyperglycemic symptoms, including none  How often are you checking your blood sugar? Patient stated once daily  What are your blood sugars ranging?  Patient stated his blood sugar readings usually range around 100-115.  During the week, how often does your blood glucose drop below 70? Patient stated Never  Are you checking your feet daily/regularly?  Patient stated he checks his feet regularly.   Adherence Review: Is the patient currently on a STATIN medication? Atorvastatin 20 mg   Is the patient currently on ACE/ARB medication? Lisinopril 10 mg  Does the patient have >5 day gap between last estimated fill dates? Per misc rpts, no.  Star Rating Drugs: Atorvastatin 20 mg 90 DS 07/07/20, Jardiance 25 mg 30 DS 08/02/20, Lisinopril 10 mg 90 DS 08/18/20, Metformin 500 mg 30 DS 08/22/20.  Follow-Up:Pharmacist Review  Charlann Lange, RMA Clinical Pharmacist Assistant [phone]  10 minutes spent in review, coordination, and documentation.  Reviewed by: Beverly Milch, PharmD Clinical Pharmacist [phone]

## 2020-09-22 ENCOUNTER — Other Ambulatory Visit: Payer: Self-pay | Admitting: Family Medicine

## 2020-10-20 ENCOUNTER — Other Ambulatory Visit: Payer: Self-pay | Admitting: Family Medicine

## 2020-10-21 ENCOUNTER — Other Ambulatory Visit: Payer: Self-pay | Admitting: Family Medicine

## 2020-10-28 ENCOUNTER — Telehealth: Payer: Self-pay | Admitting: *Deleted

## 2020-10-28 NOTE — Telephone Encounter (Signed)
Received fax results of A1C from 07/14/2020.  Noted results read as 7.2 (H)  PCP made aware and recommendations are as follows: A1C has improved but is still nt at goal (7.0)  Add Januvia '100mg'$  PO QD   Call placed to patient. [place].

## 2020-11-02 DIAGNOSIS — D123 Benign neoplasm of transverse colon: Secondary | ICD-10-CM | POA: Diagnosis not present

## 2020-11-02 DIAGNOSIS — Z86010 Personal history of colonic polyps: Secondary | ICD-10-CM | POA: Diagnosis not present

## 2020-11-02 NOTE — Telephone Encounter (Signed)
Call placed to patient and patient made aware.   States that he would like to try to work on diet prior to adding another medication.   Advised to F/U in 6 months for fasting labs.

## 2020-11-04 DIAGNOSIS — D123 Benign neoplasm of transverse colon: Secondary | ICD-10-CM | POA: Diagnosis not present

## 2020-11-08 ENCOUNTER — Encounter: Payer: Self-pay | Admitting: Family Medicine

## 2020-11-08 DIAGNOSIS — L570 Actinic keratosis: Secondary | ICD-10-CM | POA: Diagnosis not present

## 2020-11-08 DIAGNOSIS — D044 Carcinoma in situ of skin of scalp and neck: Secondary | ICD-10-CM | POA: Diagnosis not present

## 2020-11-08 DIAGNOSIS — C44311 Basal cell carcinoma of skin of nose: Secondary | ICD-10-CM | POA: Diagnosis not present

## 2020-11-08 DIAGNOSIS — Z85828 Personal history of other malignant neoplasm of skin: Secondary | ICD-10-CM | POA: Diagnosis not present

## 2020-11-08 DIAGNOSIS — L821 Other seborrheic keratosis: Secondary | ICD-10-CM | POA: Diagnosis not present

## 2020-11-08 DIAGNOSIS — D485 Neoplasm of uncertain behavior of skin: Secondary | ICD-10-CM | POA: Diagnosis not present

## 2020-11-12 ENCOUNTER — Other Ambulatory Visit: Payer: Self-pay | Admitting: Family Medicine

## 2020-11-15 ENCOUNTER — Telehealth: Payer: Self-pay | Admitting: Pharmacist

## 2020-11-15 NOTE — Progress Notes (Signed)
    Chronic Care Management Pharmacy Assistant   Name: Patrick Luna  MRN: [id number] DOB: March 11, 1945  Reason for Encounter: Disease State For HLD.   Conditions to be addressed/monitored: GERD, HLD, DM II.  Recent office visits:  None since 09/12/20  Recent consult visits:  None since 09/12/20  Hospital visits:  None since 09/12/20  Medications: Outpatient Encounter Medications as of 11/15/2020  Medication Sig   albuterol (VENTOLIN HFA) 108 (90 Base) MCG/ACT inhaler Inhale 2 puffs into the lungs every 6 (six) hours as needed for wheezing or shortness of breath.   Ascorbic Acid (VITAMIN C) 1000 MG tablet Take 1,000 mg by mouth daily.   aspirin EC 81 MG tablet Take 81 mg by mouth daily.   atorvastatin (LIPITOR) 20 MG tablet TAKE 1 TABLET BY MOUTH EVERY DAY   FREESTYLE LITE test strip USE TO TEST BLOOD SUGAR ONCE DAILY   JARDIANCE 25 MG TABS tablet TAKE 1 TABLET BY MOUTH EVERY DAY BEFORE BREAKFAST   Lancets (FREESTYLE) lancets Check BS BID - DX: E11.9 (Patient taking differently: 1 each by Other route as directed. Check BS BID - DX: E11.9)   lisinopril (ZESTRIL) 10 MG tablet TAKE 1 TABLET BY MOUTH EVERY DAY   metFORMIN (GLUCOPHAGE-XR) 500 MG 24 hr tablet TAKE 4 TABLETS BY MOUTH EVERY DAY WITH BREAKFAST   nitroGLYCERIN (NITROSTAT) 0.4 MG SL tablet Place 0.4 mg under the tongue every 5 (five) minutes as needed for chest pain.   Vitamin D3 (VITAMIN D) 25 MCG tablet Take 2,000 Units by mouth daily.   zinc gluconate 50 MG tablet Take 30 mg by mouth daily.   No facility-administered encounter medications on file as of 11/15/2020.   11/15/2020 Name: Patrick Luna MRN: [id number] DOB: [birth year] Patrick Luna is a 75 y.o. year old male who is a primary care patient of Susy Frizzle, MD.  Comprehensive medication review performed; Spoke to patient regarding cholesterol  Lipid Panel    Component Value Date/Time   CHOL 99 04/15/2019 0953   TRIG 66 10/09/2019 1837   HDL 38 (L)  04/15/2019 0953   LDLCALC 44 04/15/2019 0953    10-year ASCVD risk score: The ASCVD Risk score (Arnett DK, et al., 2019) failed to calculate for the following reasons:   The systolic blood pressure is missing   The valid total cholesterol range is 130 to 320 mg/dL  Current antihyperlipidemic regimen:  Atorvastatin 20 mg 1 tablet daily  Previous antihyperlipidemic medications tried: N/A.  ASCVD risk enhancing conditions: age >58 and DM  What recent interventions/DTPs have been made by any provider to improve Cholesterol control since last CPP Visit: None.  Any recent hospitalizations or ED visits since last visit with CPP? Patient stated no.  What diet changes have been made to improve Cholesterol?  Patient stated his diet has been about the same. He stated he drinks more than enough of water daily.   What exercise is being done to improve Cholesterol?  Patient stated he is active daily.   Adherence Review: Does the patient have >5 day gap between last estimated fill dates? Per misc rpts, no.    Care Gaps:Patient is over due for AWV. Scheduled it for Oct 27th at [facility]:Atorvastatin 20 mg 90 DS 10/06/20, Jardiance 25 mg 30 DS 10/21/20, Lisinopril 10 mg 90 DS 08/18/20, Metformin 500 mg 30 DS 11/14/20.  Follow-Up:Pharmacist Review  Charlann Lange, [place] Pharmacist Assistant [phone]

## 2020-12-08 ENCOUNTER — Ambulatory Visit (INDEPENDENT_AMBULATORY_CARE_PROVIDER_SITE_OTHER): Payer: PPO

## 2020-12-08 ENCOUNTER — Other Ambulatory Visit: Payer: Self-pay

## 2020-12-08 VITALS — Ht 66.0 in | Wt 212.0 lb

## 2020-12-08 DIAGNOSIS — E1169 Type 2 diabetes mellitus with other specified complication: Secondary | ICD-10-CM

## 2020-12-08 DIAGNOSIS — H9319 Tinnitus, unspecified ear: Secondary | ICD-10-CM | POA: Insufficient documentation

## 2020-12-08 DIAGNOSIS — Z0000 Encounter for general adult medical examination without abnormal findings: Secondary | ICD-10-CM

## 2020-12-08 DIAGNOSIS — N289 Disorder of kidney and ureter, unspecified: Secondary | ICD-10-CM | POA: Insufficient documentation

## 2020-12-08 DIAGNOSIS — J302 Other seasonal allergic rhinitis: Secondary | ICD-10-CM | POA: Insufficient documentation

## 2020-12-08 DIAGNOSIS — E7800 Pure hypercholesterolemia, unspecified: Secondary | ICD-10-CM | POA: Diagnosis not present

## 2020-12-08 NOTE — Patient Instructions (Signed)
Patrick Luna , Thank you for taking time to come for your Medicare Wellness Visit. I appreciate your ongoing commitment to your health goals. Please review the following plan we discussed and let me know if I can assist you in the future.   Screening recommendations/referrals: Colonoscopy: Done 11/02/2020 Repeat in 10 years  Recommended yearly ophthalmology/optometry visit for glaucoma screening and checkup Recommended yearly dental visit for hygiene and checkup  Vaccinations: Influenza vaccine: Declined Pneumococcal vaccine: Done 12/10/2014 and 06/19/2014 Tdap vaccine: Due Repeat in 10 years  Shingles vaccine: Shingrix discussed. Please contact your pharmacy for coverage information.     Covid-19: Declined   Advanced directives: Advance directive discussed with you today. Even though you declined this today, please call our office should you change your mind, and we can give you the proper paperwork for you to fill out.   Conditions/risks identified: Aim for 30 minutes of exercise or brisk walking each day, drink 6-8 glasses of water and eat lots of fruits and vegetables.   Next appointment: Follow up in one year for your annual wellness visit. 2023.  Preventive Care 74 Years and Older, Male  Preventive care refers to lifestyle choices and visits with your health care provider that can promote health and wellness. What does preventive care include? A yearly physical exam. This is also called an annual well check. Dental exams once or twice a year. Routine eye exams. Ask your health care provider how often you should have your eyes checked. Personal lifestyle choices, including: Daily care of your teeth and gums. Regular physical activity. Eating a healthy diet. Avoiding tobacco and drug use. Limiting alcohol use. Practicing safe sex. Taking low doses of aspirin every day. Taking vitamin and mineral supplements as recommended by your health care provider. What happens during an  annual well check? The services and screenings done by your health care provider during your annual well check will depend on your age, overall health, lifestyle risk factors, and family history of disease. Counseling  Your health care provider may ask you questions about your: Alcohol use. Tobacco use. Drug use. Emotional well-being. Home and relationship well-being. Sexual activity. Eating habits. History of falls. Memory and ability to understand (cognition). Work and work Statistician. Screening  You may have the following tests or measurements: Height, weight, and BMI. Blood pressure. Lipid and cholesterol levels. These may be checked every 5 years, or more frequently if you are over 74 years old. Skin check. Lung cancer screening. You may have this screening every year starting at age 74 if you have a 30-pack-year history of smoking and currently smoke or have quit within the past 15 years. Fecal occult blood test (FOBT) of the stool. You may have this test every year starting at age 74. Flexible sigmoidoscopy or colonoscopy. You may have a sigmoidoscopy every 5 years or a colonoscopy every 10 years starting at age 74. Prostate cancer screening. Recommendations will vary depending on your family history and other risks. Hepatitis C blood test. Hepatitis B blood test. Sexually transmitted disease (STD) testing. Diabetes screening. This is done by checking your blood sugar (glucose) after you have not eaten for a while (fasting). You may have this done every 1-3 years. Abdominal aortic aneurysm (AAA) screening. You may need this if you are a current or former smoker. Osteoporosis. You may be screened starting at age 74 if you are at high risk. Talk with your health care provider about your test results, treatment options, and if necessary, the need  for more tests. Vaccines  Your health care provider may recommend certain vaccines, such as: Influenza vaccine. This is recommended  every year. Tetanus, diphtheria, and acellular pertussis (Tdap, Td) vaccine. You may need a Td booster every 10 years. Zoster vaccine. You may need this after age 74. Pneumococcal 13-valent conjugate (PCV13) vaccine. One dose is recommended after age 74. Pneumococcal polysaccharide (PPSV23) vaccine. One dose is recommended after age 74. Talk to your health care provider about which screenings and vaccines you need and how often you need them. This information is not intended to replace advice given to you by your health care provider. Make sure you discuss any questions you have with your health care provider. Document Released: 02/25/2015 Document Revised: 10/19/2015 Document Reviewed: 11/30/2014 Elsevier Interactive Patient Education  2017 [place] Prevention in the Home Falls can cause injuries. They can happen to people of all ages. There are many things you can do to make your home safe and to help prevent falls. What can I do on the outside of my home? Regularly fix the edges of walkways and driveways and fix any cracks. Remove anything that might make you trip as you walk through a door, such as a raised step or threshold. Trim any bushes or trees on the path to your home. Use bright outdoor lighting. Clear any walking paths of anything that might make someone trip, such as rocks or tools. Regularly check to see if handrails are loose or broken. Make sure that both sides of any steps have handrails. Any raised decks and porches should have guardrails on the edges. Have any leaves, snow, or ice cleared regularly. Use sand or salt on walking paths during winter. Clean up any spills in your garage right away. This includes oil or grease spills. What can I do in the bathroom? Use night lights. Install grab bars by the toilet and in the tub and shower. Do not use towel bars as grab bars. Use non-skid mats or decals in the tub or shower. If you need to sit down in the shower,  use a plastic, non-slip stool. Keep the floor dry. Clean up any water that spills on the floor as soon as it happens. Remove soap buildup in the tub or shower regularly. Attach bath mats securely with double-sided non-slip rug tape. Do not have throw rugs and other things on the floor that can make you trip. What can I do in the bedroom? Use night lights. Make sure that you have a light by your bed that is easy to reach. Do not use any sheets or blankets that are too big for your bed. They should not hang down onto the floor. Have a firm chair that has side arms. You can use this for support while you get dressed. Do not have throw rugs and other things on the floor that can make you trip. What can I do in the kitchen? Clean up any spills right away. Avoid walking on wet floors. Keep items that you use a lot in easy-to-reach places. If you need to reach something above you, use a strong step stool that has a grab bar. Keep electrical cords out of the way. Do not use floor polish or wax that makes floors slippery. If you must use wax, use non-skid floor wax. Do not have throw rugs and other things on the floor that can make you trip. What can I do with my stairs? Do not leave any items on the stairs.  Make sure that there are handrails on both sides of the stairs and use them. Fix handrails that are broken or loose. Make sure that handrails are as long as the stairways. Check any carpeting to make sure that it is firmly attached to the stairs. Fix any carpet that is loose or worn. Avoid having throw rugs at the top or bottom of the stairs. If you do have throw rugs, attach them to the floor with carpet tape. Make sure that you have a light switch at the top of the stairs and the bottom of the stairs. If you do not have them, ask someone to add them for you. What else can I do to help prevent falls? Wear shoes that: Do not have high heels. Have rubber bottoms. Are comfortable and fit you  well. Are closed at the toe. Do not wear sandals. If you use a stepladder: Make sure that it is fully opened. Do not climb a closed stepladder. Make sure that both sides of the stepladder are locked into place. Ask someone to hold it for you, if possible. Clearly mark and make sure that you can see: Any grab bars or handrails. First and last steps. Where the edge of each step is. Use tools that help you move around (mobility aids) if they are needed. These include: Canes. Walkers. Scooters. Crutches. Turn on the lights when you go into a dark area. Replace any light bulbs as soon as they burn out. Set up your furniture so you have a clear path. Avoid moving your furniture around. If any of your floors are uneven, fix them. If there are any pets around you, be aware of where they are. Review your medicines with your doctor. Some medicines can make you feel dizzy. This can increase your chance of falling. Ask your doctor what other things that you can do to help prevent falls. This information is not intended to replace advice given to you by your health care provider. Make sure you discuss any questions you have with your health care provider. Document Released: 11/25/2008 Document Revised: 07/07/2015 Document Reviewed: 03/05/2014 Elsevier Interactive Patient Education  2017 [employer].

## 2020-12-08 NOTE — Progress Notes (Signed)
Subjective:   Patrick Luna is a 75 y.o. male who presents for Medicare Annual/Subsequent preventive examination.  Review of Systems     Cardiac Risk Factors include: advanced age (>12men, >63 women);diabetes mellitus;hypertension;obesity (BMI >30kg/m2);sedentary lifestyle;male gender     Objective:    Today's Vitals   12/08/20 1401  Weight: 212 lb (96.2 kg)  Height: 5\' 6"  (1.676 m)   Body mass index is 34.22 kg/m.  Advanced Directives 12/08/2020 10/10/2019 10/04/2019 09/06/2017 07/20/2013  Does Patient Have a Medical Advance Directive? Yes No No No Patient does not have advance directive;Patient would not like information  Type of Scientist, forensic Power of [place];Living will - - - -  Copy of [place] in Chart? No - copy requested - - - -  Would patient like information on creating a medical advance directive? - No - Patient declined No - Patient declined (No Data) -    Current Medications (verified) Outpatient Encounter Medications as of 12/08/2020  Medication Sig   Ascorbic Acid (VITAMIN C) 1000 MG tablet Take 1,000 mg by mouth daily.   aspirin EC 81 MG tablet Take 81 mg by mouth daily.   atorvastatin (LIPITOR) 20 MG tablet TAKE 1 TABLET BY MOUTH EVERY DAY   FREESTYLE LITE test strip USE TO TEST BLOOD SUGAR ONCE DAILY   JARDIANCE 25 MG TABS tablet TAKE 1 TABLET BY MOUTH EVERY DAY BEFORE BREAKFAST   Lancets (FREESTYLE) lancets Check BS BID - DX: E11.9 (Patient taking differently: 1 each by Other route as directed. Check BS BID - DX: E11.9)   lisinopril (ZESTRIL) 10 MG tablet TAKE 1 TABLET BY MOUTH EVERY DAY   metFORMIN (GLUCOPHAGE-XR) 500 MG 24 hr tablet TAKE 4 TABLETS BY MOUTH EVERY DAY WITH BREAKFAST   polyethylene glycol-electrolytes (NULYTELY) 420 g solution See admin instructions.   Vitamin D3 (VITAMIN D) 25 MCG tablet Take 2,000 Units by mouth daily.   albuterol (VENTOLIN HFA) 108 (90 Base) MCG/ACT inhaler Inhale 2 puffs into the lungs  every 6 (six) hours as needed for wheezing or shortness of breath. (Patient not taking: Reported on 12/08/2020)   nitroGLYCERIN (NITROSTAT) 0.4 MG SL tablet Place 0.4 mg under the tongue every 5 (five) minutes as needed for chest pain. (Patient not taking: Reported on 12/08/2020)   zinc gluconate 50 MG tablet Take 30 mg by mouth daily. (Patient not taking: Reported on 12/08/2020)   No facility-administered encounter medications on file as of 12/08/2020.    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[facility], botox, dilation, isosorbide   Past Surgical History:  Procedure Laterality Date   COLONOSCOPY  2007   CYSTECTOMY     BENIGN CYST REMOVAL FROM RIGHT SHOULDER   ESOPHAGEAL MANOMETRY N/A 02/02/2013   Procedure: ESOPHAGEAL MANOMETRY (EM);  Surgeon: Cleotis Nipper, MD;  Location: [facility];  Service: Endoscopy;  Laterality: N/A;   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 07/23/2013   Procedure: RIGHT FOOT: EXCISION PARTIAL BONE TALUS/CALCANEUS, REPAIR RUPTURE  ACHILLES TENDON PRIMARY OPEN/PERCUTANEOUS;  Surgeon: Ninetta Lights, MD;  Location: [place];  Service: Orthopedics;  Laterality: Right;   HAND DEBRIDEMENT  2009   left   Family History  Problem Relation Age of Onset  Hypertension Mother    Hepatitis C Sister    Social History   Socioeconomic History   Marital status: Married    Spouse name: Sunday Spillers   Number of children: 1   Years of education: Not on file   Highest education level: Not on file  Occupational History   Not on file  Tobacco Use   Smoking status: Former    Types: Cigarettes    Quit date: 11/27/1968    Years since quitting: 52.0   Smokeless tobacco: Never   Tobacco comments:    QUIT 1970   Vaping Use   Vaping Use: Never used  Substance and Sexual Activity   Alcohol use: No   Drug use: No   Sexual activity: Not Currently  Other Topics Concern   Not on file  Social History Narrative   1 daughter, 2 grandchildren, 1 great grandchild   Social Determinants of Health   Financial Resource Strain: Low Risk    Difficulty of Paying Living Expenses: Not hard at all  Food Insecurity: No Food Insecurity   Worried About Charity fundraiser in the Last Year: Never true   Arboriculturist in the Last Year: Never true  Transportation Needs: No Transportation Needs   Lack of Transportation (Medical): No   Lack of Transportation (Non-Medical): No  Physical Activity: Insufficiently Active   Days of Exercise per Week: 5 days   Minutes of Exercise per Session: 20 min  Stress: No Stress Concern Present   Feeling of Stress : Not at all  Social Connections: Socially Integrated   Frequency of Communication with Friends and Family: More than three times a week   Frequency of Social Gatherings with Friends and Family: More than three times a week   Attends Religious Services: More than 4 times per year   Active Member of [employer] or Organizations: Yes   Attends Music therapist: More than 4 times per year   Marital Status: Married    Tobacco Counseling Counseling given: Not Answered Tobacco comments: QUIT 1970   Clinical Intake:  Pre-visit preparation completed: Yes        BMI - recorded: 34.22 Nutritional Status: BMI > 30  Obese Nutritional Risks: None Diabetes: Yes  How often do you need to have someone help you when you read instructions, pamphlets, or other written materials from your doctor or pharmacy?: 1 - Never  Diabetic?Nutrition Risk Assessment:  Has the patient had any N/V/D within the last 2 months?  Yes  Does the patient have any non-healing wounds?  No  Has the patient had any unintentional weight loss or weight gain?  No   Diabetes:  Is the  patient diabetic?  Yes  If diabetic, was a CBG obtained today?  No  Did the patient bring in their glucometer from home?  No  How often do you monitor your CBG's? Daily 98..   Financial Strains and Diabetes Management:  Are you having any financial strains with the device, your supplies or your medication? Yes .  Does the patient want to be seen by Chronic Care Management for management of their diabetes?  Yes  Would the patient like to be referred to a Nutritionist or for Diabetic Management?  No   Diabetic Exams:  Diabetic Eye Exam: Completed 06/28/2020.  Pt has been advised about the importance in completing this exam.  Diabetic Foot Exam: Completed 04/07/2019. Pt has been advised about the importance in completing this exam.  Interpreter Needed?: No  Information entered by :: MJ Jaymon Dudek, LPN   Activities of Daily Living In your present state of health, do you have any difficulty performing the following activities: 12/08/2020  Hearing? N  Vision? N  Difficulty concentrating or making decisions? N  Walking or climbing stairs? N  Dressing or bathing? N  Doing errands, shopping? N  Preparing Food and eating ? N  Using the Toilet? N  In the past six months, have you accidently leaked urine? N  Do you have problems with loss of bowel control? N  Managing your Medications? N  Managing your Finances? N  Housekeeping or managing your Housekeeping? N  Some recent data might be hidden    Patient Care Team: Susy Frizzle, MD as PCP - General (Family Medicine) Satira Sark, MD as Consulting Physician (Cardiology) Edythe Clarity, [facility] as Pharmacist (Pharmacist)  Indicate any recent Medical Services you may have received from other than [facility] providers in the past year (date may be approximate).     Assessment:   This is a routine wellness examination for [employer].  Hearing/Vision screen Hearing Screening - Comments:: Hearing issues.  Vision Screening - Comments::  No glasses since cataract surgery. Dr. [name].   Dietary issues and exercise activities discussed: Current Exercise Habits: Home exercise routine, Type of exercise: walking, Time (Minutes): 20, Frequency (Times/Week): 5, Weekly Exercise (Minutes/Week): 100, Intensity: Mild, Exercise limited by: cardiac condition(s)   Goals Addressed             This Visit's Progress    Care Plan:   On track    CARE PLAN ENTRY  Current Barriers:  Chronic Disease Management support, education, and care coordination needs related to Hyperlipidemia and Diabetes  Hyperlipidemia Pharmacist Clinical Goal(s): Over the next 90 days, patient will work with PharmD and providers to maintain LDL goal < 100 Current regimen:  Atorvastatin 20mg  daily Interventions: Continue current medication Patient self care activities - Over the next 90 days, patient will: Continue to take medication as directed Contact PharmD or PCP with and questions or concerns  Diabetes Pharmacist Clinical Goal(s): Over the next 90 days, patient will work with PharmD and providers to achieve A1c goal <6.5% Current regimen:  Metformin 1000mg  twice daily Interventions: Recommend switching to metformin XR 500mg  (two tablets twice daily) in hopes of less side effects Patient self care activities - Over the next 90 days, patient will: Check blood sugar once daily, document, and provide at future appointments Contact provider with any episodes of hypoglycemia  Initial goal documentation        Depression Screen PHQ 2/9 Scores 12/08/2020 04/07/2019 09/06/2017 06/18/2016 11/19/2014  PHQ - 2 Score 0 0 0 0 0  PHQ- 9 Score - - - 1 0    Fall Risk Fall Risk  12/08/2020 04/07/2019 09/06/2017 11/19/2014  Falls in the past year? 1 0 No No  Number falls in past yr: 0 - - -  Injury with Fall? 0 - - -  Risk for fall due to : Impaired balance/gait - History of fall(s) -  Follow up Falls prevention discussed Falls evaluation completed - -     FALL RISK PREVENTION PERTAINING TO THE HOME:  Any stairs in or around the home? Yes  If so, are there any without handrails? No  Home free of loose throw rugs in walkways, pet beds, electrical cords, etc? Yes  Adequate lighting in your home to reduce risk of falls? Yes   ASSISTIVE DEVICES  UTILIZED TO PREVENT FALLS:  Life alert? No  Use of a cane, walker or w/c? No  Grab bars in the bathroom? Yes  Shower chair or bench in shower? No  Elevated toilet seat or a handicapped toilet? No   TIMED UP AND GO:  Was the test performed? Yes .  Length of time to ambulate 10 feet: 8 sec.   Gait steady and fast without use of assistive device  Cognitive Function:     6CIT Screen 12/08/2020  What Year? 0 points  What month? 0 points  What time? 0 points  Count back from 20 0 points  Months in reverse 0 points  Repeat phrase 0 points  Total Score 0    Immunizations Immunization History  Administered Date(s) Administered   Influenza,inj,Quad PF,6+ Mos 12/10/2014   Pneumococcal Conjugate-13 06/18/2016   Pneumococcal Polysaccharide-23 12/10/2014    TDAP status: Due, Education has been provided regarding the importance of this vaccine. Advised may receive this vaccine at local pharmacy or Health Dept. Aware to provide a copy of the vaccination record if obtained from local pharmacy or Health Dept. Verbalized acceptance and understanding.  Flu Vaccine status: Declined, Education has been provided regarding the importance of this vaccine but patient still declined. Advised may receive this vaccine at local pharmacy or Health Dept. Aware to provide a copy of the vaccination record if obtained from local pharmacy or Health Dept. Verbalized acceptance and understanding.  Pneumococcal vaccine status: Up to date  Covid-19 vaccine status: Declined, Education has been provided regarding the importance of this vaccine but patient still declined. Advised may receive this vaccine at local  pharmacy or Health Dept.or vaccine clinic. Aware to provide a copy of the vaccination record if obtained from local pharmacy or Health Dept. Verbalized acceptance and understanding.  Qualifies for Shingles Vaccine? Yes   Zostavax completed No   Shingrix Completed?: No.    Education has been provided regarding the importance of this vaccine. Patient has been advised to call insurance company to determine out of pocket expense if they have not yet received this vaccine. Advised may also receive vaccine at local pharmacy or Health Dept. Verbalized acceptance and understanding.  Screening Tests Health Maintenance  Topic Date Due   COVID-19 Vaccine (1) Never done   TETANUS/TDAP  Never done   Zoster Vaccines- Shingrix (1 of 2) Never done   FOOT EXAM  04/06/2020   INFLUENZA VACCINE  09/12/2020   HEMOGLOBIN A1C  01/13/2021   OPHTHALMOLOGY EXAM  06/28/2021   COLONOSCOPY (Pts 45-26yrs Insurance coverage will need to be confirmed)  02/13/2024   Pneumonia Vaccine 67+ Years old  Completed   Hepatitis C Screening  Completed   HPV VACCINES  Aged Out    Health Maintenance  Health Maintenance Due  Topic Date Due   COVID-19 Vaccine (1) Never done   TETANUS/TDAP  Never done   Zoster Vaccines- Shingrix (1 of 2) Never done   FOOT EXAM  04/06/2020   INFLUENZA VACCINE  09/12/2020    Colorectal cancer screening: Type of screening: Colonoscopy. Completed 11/02/20. Repeat every 10 years  Lung Cancer Screening: (Low Dose CT Chest recommended if Age 17-80 years, 30 pack-year currently smoking OR have quit w/in 15years.) does not qualify.     Additional Screening:  Hepatitis C Screening: does qualify; Completed 06/21/2016  Vision Screening: Recommended annual ophthalmology exams for early detection of glaucoma and other disorders of the eye. Is the patient up to date with their annual eye exam?  Yes  Who is the provider or what is the name of the office in which the patient attends annual eye exams?  Dr. [name] If pt is not established with a provider, would they like to be referred to a provider to establish care? No .   Dental Screening: Recommended annual dental exams for proper oral hygiene  Community Resource Referral / Chronic Care Management: CRR required this visit?  No   CCM required this visit?  Yes      Plan:     I have personally reviewed and noted the following in the patient's chart:   Medical and social history Use of alcohol, tobacco or illicit drugs  Current medications and supplements including opioid prescriptions. Patient is not currently taking opioid prescriptions. Functional ability and status Nutritional status Physical activity Advanced directives List of other physicians Hospitalizations, surgeries, and ER visits in previous 12 months Vitals Screenings to include cognitive, depression, and falls Referrals and appointments  In addition, I have reviewed and discussed with patient certain preventive protocols, quality metrics, and best practice recommendations. A written personalized care plan for preventive services as well as general preventive health recommendations were provided to patient.     Chriss Driver, LPN   [id number]   Nurse Notes: Pt states he is doing well. Pt declines flu and Covid vaccines. Shingles vaccine discussed. Up to date on health maintenance. Is due for DM Foot Exam. CCM referral ordered due to pt having issues affording Jardiance. Pt is agreeable to referral.

## 2020-12-17 ENCOUNTER — Other Ambulatory Visit: Payer: Self-pay | Admitting: Family Medicine

## 2021-02-16 DIAGNOSIS — N430 Encysted hydrocele: Secondary | ICD-10-CM | POA: Diagnosis not present

## 2021-02-16 DIAGNOSIS — N401 Enlarged prostate with lower urinary tract symptoms: Secondary | ICD-10-CM | POA: Diagnosis not present

## 2021-02-16 DIAGNOSIS — R350 Frequency of micturition: Secondary | ICD-10-CM | POA: Diagnosis not present

## 2021-02-18 ENCOUNTER — Other Ambulatory Visit: Payer: Self-pay | Admitting: Family Medicine

## 2021-03-14 ENCOUNTER — Telehealth: Payer: Self-pay | Admitting: Pharmacist

## 2021-03-14 ENCOUNTER — Other Ambulatory Visit: Payer: Self-pay | Admitting: Family Medicine

## 2021-03-14 NOTE — Progress Notes (Signed)
Chronic Care Management Pharmacy Assistant   Name: Patrick Luna  MRN: [id number] DOB: 1945-03-04   Reason for Encounter: Disease State - Diabetes Call    Recent office visits:  12/08/20 Annual Medicare Wellness Completed  Recent consult visits:  None noted  Hospital visits:  None in previous 6 months  Medications: Outpatient Encounter Medications as of 03/14/2021  Medication Sig   albuterol (VENTOLIN HFA) 108 (90 Base) MCG/ACT inhaler Inhale 2 puffs into the lungs every 6 (six) hours as needed for wheezing or shortness of breath. (Patient not taking: Reported on 12/08/2020)   Ascorbic Acid (VITAMIN C) 1000 MG tablet Take 1,000 mg by mouth daily.   aspirin EC 81 MG tablet Take 81 mg by mouth daily.   atorvastatin (LIPITOR) 20 MG tablet TAKE 1 TABLET BY MOUTH EVERY DAY   FREESTYLE LITE test strip USE TO TEST BLOOD SUGAR ONCE DAILY   JARDIANCE 25 MG TABS tablet TAKE 1 TABLET BY MOUTH EVERY DAY BEFORE BREAKFAST   Lancets (FREESTYLE) lancets Check BS BID - DX: E11.9 (Patient taking differently: 1 each by Other route as directed. Check BS BID - DX: E11.9)   lisinopril (ZESTRIL) 10 MG tablet TAKE 1 TABLET BY MOUTH EVERY DAY   metFORMIN (GLUCOPHAGE-XR) 500 MG 24 hr tablet TAKE 4 TABLETS BY MOUTH EVERY DAY WITH BREAKFAST   nitroGLYCERIN (NITROSTAT) 0.4 MG SL tablet Place 0.4 mg under the tongue every 5 (five) minutes as needed for chest pain. (Patient not taking: Reported on 12/08/2020)   polyethylene glycol-electrolytes (NULYTELY) 420 g solution See admin instructions.   Vitamin D3 (VITAMIN D) 25 MCG tablet Take 2,000 Units by mouth daily.   zinc gluconate 50 MG tablet Take 30 mg by mouth daily. (Patient not taking: Reported on 12/08/2020)   No facility-administered encounter medications on file as of 03/14/2021.    Current antihyperglycemic regimen:  Jardiance 25 mg 1 daily before breakfast Metformin 500 mg 4 tablets daily with breakfast.   What recent interventions/DTPs  have been made to improve glycemic control:  Patient reported he has had no changes in his medication regimen.   Have there been any recent hospitalizations or ED visits since last visit with CPP?  Patient has not had nay hospitalizations or ED visits since last visit with CPP   Patient denies hypoglycemic symptoms, including Pale, Sweaty, Shaky, Hungry, Nervous/irritable, and Vision changes   Patient denies hyperglycemic symptoms, including blurry vision, excessive thirst, fatigue, polyuria, and weakness   How often are you checking your blood sugar? Patient reported checking his blood sugars every morning.    What are your blood sugars ranging?  Fasting: 97-113 Before meals:  After meals:  Bedtime:   During the week, how often does your blood glucose drop below 70?  Patient reported no readings of 70 or lower.   Are you checking your feet daily/regularly? Patient reported he does keep a check on his feet regularly and had no concerns currently.     Adherence Review: Is the patient currently on a STATIN medication? Yes Is the patient currently on ACE/ARB medication? Yes Does the patient have >5 day gap between last estimated fill dates? No   Care Gaps  AWV: done 12/08/20 Colonoscopy: due 02/13/24 DM Eye Exam: due 06/28/21 DM Foot Exam: due 04/06/20 Microalbumin: done 04/15/19 HbgAIC: done 07/18/20 (7.2) DEXA: N/A Mammogram: N/A   Star Rating Drugs: Atorvastatin 20 mg - last filled 01/03/21 90 days  Jardiance 25 mg - last filled 01/17/21 30 days  Lisinopril 10 mg - last filled 11/26/20 90 days  Metformin 500 mg - last filled 12/19/20 90 days   Future Appointments  Date Time Provider [place]  12/08/2021  2:00 PM [facility]-NURSE HEALTH ADVISOR [facility] None    Jobe Gibbon, [facility] Clinical Pharmacist Assistant  [phone]

## 2021-03-21 ENCOUNTER — Other Ambulatory Visit: Payer: Self-pay

## 2021-03-21 MED ORDER — LISINOPRIL 10 MG PO TABS: 10.0000 mg | ORAL_TABLET | Freq: Every day | ORAL | 0 refills | Status: DC

## 2021-03-27 DIAGNOSIS — L570 Actinic keratosis: Secondary | ICD-10-CM | POA: Diagnosis not present

## 2021-03-27 DIAGNOSIS — D485 Neoplasm of uncertain behavior of skin: Secondary | ICD-10-CM | POA: Diagnosis not present

## 2021-03-27 DIAGNOSIS — Z85828 Personal history of other malignant neoplasm of skin: Secondary | ICD-10-CM | POA: Diagnosis not present

## 2021-03-30 ENCOUNTER — Ambulatory Visit (INDEPENDENT_AMBULATORY_CARE_PROVIDER_SITE_OTHER): Payer: PPO | Admitting: Family Medicine

## 2021-03-30 ENCOUNTER — Other Ambulatory Visit: Payer: Self-pay

## 2021-03-30 ENCOUNTER — Encounter: Payer: Self-pay | Admitting: Family Medicine

## 2021-03-30 VITALS — BP 108/62 | HR 82 | Temp 97.3°F | Resp 18 | Ht 66.0 in | Wt 209.0 lb

## 2021-03-30 DIAGNOSIS — E1169 Type 2 diabetes mellitus with other specified complication: Secondary | ICD-10-CM

## 2021-03-30 DIAGNOSIS — I10 Essential (primary) hypertension: Secondary | ICD-10-CM

## 2021-03-30 NOTE — Progress Notes (Signed)
Subjective:    Patient ID: Patrick Luna, male    DOB: 04-27-1945, 76 y.o.   MRN: [id number]  HPI   Patient is a very pleasant 76 year old Caucasian gentleman here today for follow-up of his diabetes.  His blood pressure is well controlled at 108/62.  He denies any angina or chest pain or shortness of breath or dyspnea on exertion.  Diabetic foot exam was performed today and was completely normal.  He did not any neuropathy in his feet.  Specifically he denies any numbness.  He has excellent pulses.  He denies any claudication.  He has seen his eye doctor earlier this year and was told that he had no diabetic retinopathy.  His colonoscopy was performed last year and is up-to-date.  His PSA has been checked within the last year by his urologist.  He is due for the shingles vaccine. 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 (Patient not taking: Reported on 12/08/2020) 6.7 g 0   Ascorbic Acid (VITAMIN C) 1000 MG tablet Take 1,000 mg by mouth daily.     aspirin EC 81 MG tablet Take 81 mg by mouth daily.     atorvastatin (LIPITOR) 20 MG tablet TAKE 1 TABLET BY MOUTH EVERY DAY 90 tablet 3   FREESTYLE LITE test strip USE TO TEST BLOOD SUGAR ONCE DAILY 100 strip 6   JARDIANCE 25 MG TABS tablet TAKE 1 TABLET BY MOUTH EVERY DAY BEFORE BREAKFAST 30 tablet 0   Lancets (FREESTYLE) lancets Check BS BID - DX: E11.9 (Patient taking differently: 1 each by Other route as directed. Check BS BID - DX: E11.9) 200 each 3   lisinopril (ZESTRIL) 10 MG tablet Take 1 tablet (10 mg total) by mouth daily. 30 tablet 0   metFORMIN (GLUCOPHAGE-XR) 500 MG 24 hr tablet TAKE 4 TABLETS BY MOUTH EVERY DAY WITH BREAKFAST 120 tablet 5   nitroGLYCERIN (NITROSTAT) 0.4 MG SL tablet Place 0.4 mg under the tongue every 5 (five) minutes as needed for chest pain. (Patient not taking: Reported on 12/08/2020)     polyethylene glycol-electrolytes (NULYTELY) 420 g solution See admin instructions.     Vitamin D3 (VITAMIN D) 25 MCG tablet Take 2,000 Units by mouth daily.     zinc gluconate 50 MG tablet Take 30 mg by mouth daily. (Patient not taking: Reported on 12/08/2020)     No current  facility-administered medications on file prior to visit.   Allergies  Allergen Reactions   Ampicillin Rash   Social History   Socioeconomic History   Marital status: Married    Spouse name: Sunday Spillers   Number of children: 1   Years of education: Not on file   Highest education level: Not on file  Occupational History   Not on file  Tobacco Use   Smoking status: Former    Types: Cigarettes    Quit date: 11/27/1968    Years since quitting: 52.3   Smokeless tobacco: Never   Tobacco comments:    QUIT 1970  Vaping Use   Vaping Use: Never used  Substance and Sexual Activity   Alcohol use: No   Drug use: No   Sexual activity: Not Currently   Other Topics Concern   Not on file  Social History Narrative   1 daughter, 2 grandchildren, 1 great grandchild   Social Determinants of Health   Financial Resource Strain: Low Risk    Difficulty of Paying Living Expenses: Not hard at all  Food Insecurity: No Food Insecurity   Worried About Charity fundraiser in the Last Year: Never true   Arboriculturist in the Last Year: Never true  Transportation Needs: No Transportation Needs   Lack of Transportation (Medical): No   Lack of Transportation (Non-Medical): No  Physical Activity: Insufficiently Active   Days of Exercise per Week: 5 days   Minutes of Exercise per Session: 20 min  Stress: No Stress Concern Present   Feeling of Stress : Not at all  Social Connections: Socially Integrated   Frequency of Communication with Friends and Family: More than three times a week   Frequency of Social Gatherings with Friends and Family: More than three times a week   Attends Religious Services: More than 4 times per year   Active Member of [employer] or Organizations: Yes   Attends Music therapist: More than 4 times per year   Marital Status: Married  Human resources officer Violence: Not At Risk   Fear of Current or Ex-Partner: No   Emotionally Abused: No   Physically Abused: No   Sexually Abused: No      Review of Systems  All other systems reviewed and are negative.     Objective:   Physical Exam Vitals reviewed.  Constitutional:      Appearance: He is well-developed.  Neck:     Thyroid: No thyromegaly.     Vascular: No JVD.  Cardiovascular:     Rate and Rhythm: Normal rate and regular rhythm.     Heart sounds: Normal heart sounds. No murmur heard. Pulmonary:     Effort: Pulmonary effort is normal. No respiratory distress.     Breath sounds: Normal breath sounds. No wheezing or rales.  Abdominal:     General: Bowel sounds are normal. There is no distension.     Palpations: Abdomen is soft.     Tenderness: There is no  abdominal tenderness. There is no guarding or rebound.          Assessment & Plan:  Type 2 diabetes mellitus with other specified complication, without long-term current use of insulin (HCC) - Plan: CBC with Differential/Platelet, COMPLETE METABOLIC PANEL WITH GFR, Hemoglobin A1c, Lipid panel, Microalbumin, urine  Benign essential HTN Blood pressure is excellent.  Diabetic foot exam is normal.  Diabetic eye exam is up-to-date.  He reports blood sugars between 90 and 115.  These are  excellent.  I will check an A1c.  Goal A1c is less than 6.5.  Check an LDL cholesterol.  Goal LDL cholesterol is less than 100.  Check a urine microalbumin.  Goal albumin to creatinine ratio is less than 30.  Recommended the shingles vaccine.  PSA and colonoscopy are up-to-date.

## 2021-03-31 LAB — CBC WITH DIFFERENTIAL/PLATELET
Absolute Monocytes: 792 cells/uL (ref 200–950)
Basophils Absolute: 44 cells/uL (ref 0–200)
Basophils Relative: 0.5 %
Eosinophils Absolute: 194 cells/uL (ref 15–500)
Eosinophils Relative: 2.2 %
HCT: 45.1 % (ref 38.5–50.0)
Hemoglobin: 14.9 g/dL (ref 13.2–17.1)
Lymphs Abs: 2147 cells/uL (ref 850–3900)
MCH: 30.2 pg (ref 27.0–33.0)
MCHC: 33 g/dL (ref 32.0–36.0)
MCV: 91.3 fL (ref 80.0–100.0)
MPV: 11.3 fL (ref 7.5–12.5)
Monocytes Relative: 9 %
Neutro Abs: 5623 cells/uL (ref 1500–7800)
Neutrophils Relative %: 63.9 %
Platelets: 289 10*3/uL (ref 140–400)
RBC: 4.94 10*6/uL (ref 4.20–5.80)
RDW: 13 % (ref 11.0–15.0)
Total Lymphocyte: 24.4 %
WBC: 8.8 10*3/uL (ref 3.8–10.8)

## 2021-03-31 LAB — LIPID PANEL
Cholesterol: 97 mg/dL (ref <200)
HDL: 41 mg/dL (ref >=40)
LDL Cholesterol (Calc): 36 mg/dL (calc)
Non-HDL Cholesterol (Calc): 56 mg/dL (calc) (ref <130)
Total CHOL/HDL Ratio: 2.4 (calc) (ref <5.0)
Triglycerides: 113 mg/dL (ref <150)

## 2021-03-31 LAB — COMPLETE METABOLIC PANEL WITH GFR
AG Ratio: 1.6 (calc) (ref 1.0–2.5)
ALT: 29 U/L (ref 9–46)
AST: 25 U/L (ref 10–35)
Albumin: 4.1 g/dL (ref 3.6–5.1)
Alkaline phosphatase (APISO): 55 U/L (ref 35–144)
BUN: 19 mg/dL (ref 7–25)
CO2: 27 mmol/L (ref 20–32)
Calcium: 9.9 mg/dL (ref 8.6–10.3)
Chloride: 103 mmol/L (ref 98–110)
Creat: 1.09 mg/dL (ref 0.70–1.28)
Globulin: 2.6 g/dL (calc) (ref 1.9–3.7)
Glucose, Bld: 102 mg/dL — ABNORMAL HIGH (ref 65–99)
Potassium: 4.4 mmol/L (ref 3.5–5.3)
Sodium: 140 mmol/L (ref 135–146)
Total Bilirubin: 0.8 mg/dL (ref 0.2–1.2)
Total Protein: 6.7 g/dL (ref 6.1–8.1)
eGFR: 71 mL/min/{1.73_m2} (ref >=60)

## 2021-03-31 LAB — MICROALBUMIN, URINE: Microalb, Ur: 0.3 mg/dL

## 2021-03-31 LAB — HEMOGLOBIN A1C
Hgb A1c MFr Bld: 6.6 % of total Hgb — ABNORMAL HIGH (ref <5.7)
Mean Plasma Glucose: 143 mg/dL
eAG (mmol/L): 7.9 mmol/L

## 2021-04-10 ENCOUNTER — Telehealth: Payer: Self-pay

## 2021-04-10 MED ORDER — EMPAGLIFLOZIN 25 MG PO TABS: ORAL_TABLET | ORAL | 3 refills | Status: DC

## 2021-04-10 NOTE — Telephone Encounter (Signed)
Refill sent to pharmacy.   

## 2021-04-10 NOTE — Telephone Encounter (Signed)
Pharmacy faxed in refill request for  JARDIANCE 25 MG TABS tablet [[id number]]    Order Details Dose, Route, Frequency: As Directed  Dispense Quantity: 30 tablet Refills: 0   Note to Pharmacy: Needs appointment       Sig: TAKE 1 TABLET BY MOUTH EVERY DAY BEFORE BREAKFAST

## 2021-04-12 ENCOUNTER — Other Ambulatory Visit: Payer: Self-pay | Admitting: Family Medicine

## 2021-05-03 ENCOUNTER — Telehealth: Payer: Self-pay | Admitting: Pharmacist

## 2021-05-03 NOTE — Progress Notes (Signed)
? ? ?Chronic Care Management ?Pharmacy Assistant  ? ?Name: Patrick Luna  MRN: [id number] DOB: [birth year] ? ? ?Reason for Encounter: Disease State - Diabetes Call  ?  ? ?Recent office visits:  ?03/30/21 Jenna Luo, MD - Family Medicine - Diabetes - Labs were ordered. No medication changes. Follow up as scheduled.  ? ?Recent consult visits:  ?None noted.  ? ?Hospital visits:  ?None in previous 6 months ? ?Medications: ?Outpatient Encounter Medications as of 05/03/2021  ?Medication Sig  ? albuterol (VENTOLIN HFA) 108 (90 Base) MCG/ACT inhaler Inhale 2 puffs into the lungs every 6 (six) hours as needed for wheezing or shortness of breath.  ? Ascorbic Acid (VITAMIN C) 1000 MG tablet Take 1,000 mg by mouth daily.  ? aspirin EC 81 MG tablet Take 81 mg by mouth daily.  ? atorvastatin (LIPITOR) 20 MG tablet TAKE 1 TABLET BY MOUTH EVERY DAY  ? empagliflozin (JARDIANCE) 25 MG TABS tablet TAKE 1 TABLET BY MOUTH EVERY DAY BEFORE BREAKFAST  ? FREESTYLE LITE test strip USE TO TEST BLOOD SUGAR ONCE DAILY  ? Lancets (FREESTYLE) lancets Check BS BID - DX: E11.9 (Patient taking differently: 1 each by Other route as directed. Check BS BID - DX: E11.9)  ? lisinopril (ZESTRIL) 10 MG tablet TAKE 1 TABLET BY MOUTH EVERY DAY  ? metFORMIN (GLUCOPHAGE-XR) 500 MG 24 hr tablet TAKE 4 TABLETS BY MOUTH EVERY DAY WITH BREAKFAST  ? nitroGLYCERIN (NITROSTAT) 0.4 MG SL tablet Place 0.4 mg under the tongue every 5 (five) minutes as needed for chest pain.  ? polyethylene glycol-electrolytes (NULYTELY) 420 g solution See admin instructions.  ? Vitamin D3 (VITAMIN D) 25 MCG tablet Take 2,000 Units by mouth daily.  ? zinc gluconate 50 MG tablet Take 30 mg by mouth daily.  ? ?No facility-administered encounter medications on file as of 05/03/2021.  ? ? ?Current antihyperglycemic regimen:  ?Jardiance 25 mg 1 daily before breakfast ?Metformin 500 mg 4 tablets daily with breakfast. ?  ?  ?What recent interventions/DTPs have been made to improve glycemic  control:  ?Patient reported he has had no changes in his medication regimen.  ?  ?Have there been any recent hospitalizations or ED visits since last visit with [facility]?  ?Patient has not had any hospitalizations or ED visits since last visit with [facility]  ?  ?Patient denies hypoglycemic symptoms, including Pale, Sweaty, Shaky, Hungry, Nervous/irritable, and Vision changes ?  ?  ?Patient denies hyperglycemic symptoms, including blurry vision, excessive thirst, fatigue, polyuria, and weakness ?  ?  ?How often are you checking your blood sugar? ?Patient reported checking his blood sugars once daily every morning.  ?             ?What are your blood sugars ranging?  ?Fasting: 88-106 ?Before meals:  ?After meals:  ?Bedtime:  ?  ?During the week, how often does your blood glucose drop below 70? ? Patient reported no readings of 70 or lower.  ?  ?Are you checking your feet daily/regularly? ?Patient reported he does keep a check on his feet regularly and had no concerns currently.  ?             ?  ?Adherence Review: ?Is the patient currently on a STATIN medication? Yes ?Is the patient currently on ACE/ARB medication? Yes ?Does the patient have >5 day gap between last estimated fill dates? No ?  ?  ?Care Gaps ?  ?AWV: done 12/08/20 ?Colonoscopy: due 02/13/24 ?DM Eye Exam: due 06/28/21 ?DM  Foot Exam: due 03/30/22 ?Microalbumin: done 03/30/21 ?HbgAIC: done 03/30/21 (6.6) ?DEXA: N/A ?Mammogram: N/A ?  ?  ?Star Rating Drugs: ?Atorvastatin 20 mg - last filled 04/06/21 90 days  ?Jardiance 25 mg - last filled 04/10/21 30 days  ?Lisinopril 10 mg - last filled 03/21/21 90 days  ?Metformin 500 mg - last filled 03/19/21 90 days  ? ? ? ?Future Appointments  ?Date Time Provider [place]  ?09/28/2021  2:00 PM Pickard, Cammie Mcgee, MD [facility]  ?12/08/2021  2:00 PM [facility]-NURSE HEALTH ADVISOR [facility]  ? ? ? ?Liza Showfety, CCMA ?Clinical Pharmacist Assistant  ?[phone] ? ?

## 2021-05-08 DIAGNOSIS — L570 Actinic keratosis: Secondary | ICD-10-CM | POA: Diagnosis not present

## 2021-05-08 DIAGNOSIS — D225 Melanocytic nevi of trunk: Secondary | ICD-10-CM | POA: Diagnosis not present

## 2021-05-08 DIAGNOSIS — Z85828 Personal history of other malignant neoplasm of skin: Secondary | ICD-10-CM | POA: Diagnosis not present

## 2021-06-15 ENCOUNTER — Other Ambulatory Visit: Payer: Self-pay | Admitting: Family Medicine

## 2021-06-15 NOTE — Telephone Encounter (Signed)
Requested Prescriptions  ?Pending Prescriptions Disp Refills  ?? metFORMIN (GLUCOPHAGE-XR) 500 MG 24 hr tablet [Pharmacy Med Name: METFORMIN HCL ER 500 MG TABLET] 360 tablet 1  ?  Sig: TAKE 4 TABLETS BY MOUTH EVERY DAY WITH BREAKFAST  ?  ? Endocrinology:  Diabetes - Biguanides Failed - 06/15/2021  3:02 AM  ?  ?  Failed - B12 Level in normal range and within 720 days  ?  No results found for: VITAMINB12   ?  ?  Passed - Cr in normal range and within 360 days  ?  Creat  ?Date Value Ref Range Status  ?03/30/2021 1.09 0.70 - 1.28 mg/dL Final  ? ?Creatinine, Urine  ?Date Value Ref Range Status  ?10/12/2019 87.69 mg/dL Final  ?  Comment:  ?  Performed at [facility], [place] [address] [place], [place] [address]  ?   ?  ?  Passed - HBA1C is between 0 and 7.9 and within 180 days  ?  Hgb A1C (fingerstick)  ?Date Value Ref Range Status  ?01/14/2013 5.8 (H) <5.7 % Final  ?  Comment:  ?                                                                         ?According to the ADA Clinical Practice Recommendations for 2011, when ?HbA1c is used as a screening test: ?  ?  >=6.5%   Diagnostic of Diabetes Mellitus ?           (if abnormal result is confirmed) ?  ?5.7-6.4%   Increased risk of developing Diabetes Mellitus ?  ?References:Diagnosis and Classification of Diabetes Mellitus,Diabetes ?YPPJ,0932,67(TIWPY 1):S62-S69 and Standards of Medical Care in         ?Diabetes - 2011,Diabetes Care,2011,34 (Suppl 1):S11-S61. ?  ?PERFORMED BY JWRAY  ? ?Hgb A1c MFr Bld  ?Date Value Ref Range Status  ?03/30/2021 6.6 (H) <5.7 % of total Hgb Final  ?  Comment:  ?  For someone without known diabetes, a hemoglobin A1c ?value of 6.5% or greater indicates that they may have  ?diabetes and this should be confirmed with a follow-up  ?test. ?. ?For someone with known diabetes, a value <7% indicates  ?that their diabetes is well controlled and a value  ?greater than or equal to 7% indicates suboptimal  ?control. A1c targets should be individualized  based on  ?duration of diabetes, age, comorbid conditions, and  ?other considerations. ?. ?Currently, no consensus exists regarding use of ?hemoglobin A1c for diagnosis of diabetes for children. ?. ?  ?   ?  ?  Passed - eGFR in normal range and within 360 days  ?  GFR, Est African American  ?Date Value Ref Range Status  ?10/22/2019 103 > OR = 60 mL/min/1.29m Final  ? ?GFR, Est Non African American  ?Date Value Ref Range Status  ?10/22/2019 89 > OR = 60 mL/min/1.755mFinal  ? ?eGFR  ?Date Value Ref Range Status  ?03/30/2021 71 > OR = 60 mL/min/1.7339minal  ?  Comment:  ?  The eGFR is based on the CKD-EPI 2021 equation. To calculate  ?the new eGFR from a previous Creatinine or Cystatin C ?result, go to https://www.kidney.org/professionals/ ?kdoqi/gfr%5Fcalculator ?  ?   ?  ?  Passed - Valid encounter within last 6 months  ?  Recent Outpatient Visits   ?      ? 2 months ago Type 2 diabetes mellitus with other specified complication, without long-term current use of insulin ([place])  ? [facility] Pickard, Cammie Mcgee, MD  ? 1 year ago Acute hypoxemic respiratory failure due to COVID-19 [facility])  ? [facility] Pickard, Cammie Mcgee, MD  ? 2 years ago Controlled type 2 diabetes mellitus without complication, without long-term current use of insulin ([place])  ? [place] Pickard, Cammie Mcgee, MD  ? 3 years ago Encounter for Medicare annual wellness exam  ? [place] Delsa Grana, PA-C  ? 4 years ago Need for prophylactic vaccination against Streptococcus pneumoniae (pneumococcus)  ? [facility] Pickard, Cammie Mcgee, MD  ?  ?  ?Future Appointments   ?        ? In 3 months Pickard, Cammie Mcgee, MD [place], [facility]  ?  ? ?  ?  ?  Passed - CBC within normal limits and completed in the last 12 months  ?  WBC  ?Date Value Ref Range Status  ?03/30/2021 8.8 3.8 - 10.8 Thousand/uL Final  ? ?RBC  ?Date Value Ref Range Status  ?03/30/2021 4.94 4.20 -  5.80 Million/uL Final  ? ?Hemoglobin  ?Date Value Ref Range Status  ?03/30/2021 14.9 13.2 - 17.1 g/dL Final  ? ?HCT  ?Date Value Ref Range Status  ?03/30/2021 45.1 38.5 - 50.0 % Final  ? ?MCHC  ?Date Value Ref Range Status  ?03/30/2021 33.0 32.0 - 36.0 g/dL Final  ? ?MCH  ?Date Value Ref Range Status  ?03/30/2021 30.2 27.0 - 33.0 pg Final  ? ?MCV  ?Date Value Ref Range Status  ?03/30/2021 91.3 80.0 - 100.0 fL Final  ? ?No results found for: PLTCOUNTKUC, LABPLAT, [place] ?RDW  ?Date Value Ref Range Status  ?03/30/2021 13.0 11.0 - 15.0 % Final  ? ?  ?  ?  ? ?

## 2021-06-29 DIAGNOSIS — E119 Type 2 diabetes mellitus without complications: Secondary | ICD-10-CM | POA: Diagnosis not present

## 2021-06-29 DIAGNOSIS — Z961 Presence of intraocular lens: Secondary | ICD-10-CM | POA: Diagnosis not present

## 2021-07-05 ENCOUNTER — Other Ambulatory Visit: Payer: Self-pay | Admitting: Family Medicine

## 2021-07-06 NOTE — Telephone Encounter (Signed)
Requested Prescriptions  Pending Prescriptions Disp Refills  . atorvastatin (LIPITOR) 20 MG tablet [Pharmacy Med Name: ATORVASTATIN 20 MG TABLET] 90 tablet 1    Sig: TAKE 1 TABLET BY MOUTH EVERY DAY     Cardiovascular:  Antilipid - Statins Failed - 07/05/2021  2:06 AM      Failed - Lipid Panel in normal range within the last 12 months    Cholesterol  Date Value Ref Range Status  03/30/2021 97 <200 mg/dL Final   LDL Cholesterol (Calc)  Date Value Ref Range Status  03/30/2021 36 mg/dL (calc) Final    Comment:    Reference range: <100 . Desirable range <100 mg/dL for primary prevention;   <70 mg/dL for patients with CHD or diabetic patients  with > or = 2 CHD risk factors. Marland Kitchen LDL-C is now calculated using the Martin-Hopkins  calculation, which is a validated novel method providing  better accuracy than the Friedewald equation in the  estimation of LDL-C.  Cresenciano Genre et al. Annamaria Helling. 3536;144(31): 2061-2068  (http://education.[employer].com/faq/FAQ164)    HDL  Date Value Ref Range Status  03/30/2021 41 > OR = 40 mg/dL Final   Triglycerides  Date Value Ref Range Status  03/30/2021 113 <150 mg/dL Final         Passed - Patient is not pregnant      Passed - Valid encounter within last 12 months    Recent Outpatient Visits          3 months ago Type 2 diabetes mellitus with other specified complication, without long-term current use of insulin ([place])   [place] Pickard, Cammie Mcgee, MD   1 year ago Acute hypoxemic respiratory failure due to COVID-19 [facility])   [place] Susy Frizzle, MD   2 years ago Controlled type 2 diabetes mellitus without complication, without long-term current use of insulin ([place])   [place] Pickard, Cammie Mcgee, MD   3 years ago Encounter for Medicare annual wellness exam   [place] Delsa Grana, PA-C   5 years ago Need for prophylactic vaccination against Streptococcus  pneumoniae (pneumococcus)   [place], Cammie Mcgee, MD      Future Appointments            In 2 months Pickard, Cammie Mcgee, MD [place]

## 2021-07-20 ENCOUNTER — Encounter: Payer: Self-pay | Admitting: Family Medicine

## 2021-08-11 ENCOUNTER — Other Ambulatory Visit: Payer: Self-pay

## 2021-08-11 MED ORDER — FREESTYLE LITE TEST VI STRP: ORAL_STRIP | 6 refills | Status: DC

## 2021-08-29 ENCOUNTER — Other Ambulatory Visit: Payer: Self-pay | Admitting: Family Medicine

## 2021-08-30 NOTE — Telephone Encounter (Signed)
Pharmacy comment: Alternative Requested:NEED PA OR CHANGE TO ONE TOUCH OR FREESTYLE LIBRE  Requested Prescriptions  Pending Prescriptions Disp Refills   FREESTYLE LITE test strip [Pharmacy Med Name: [place] TEST STRIP] 100 strip 6    Sig: USE TO TEST BLOOD SUGAR ONCE DAILY     Endocrinology: Diabetes - Testing Supplies Passed - 08/29/2021  7:54 AM      Passed - Valid encounter within last 12 months    Recent Outpatient Visits           5 months ago Type 2 diabetes mellitus with other specified complication, without long-term current use of insulin ([place])   [place] Pickard, Cammie Mcgee, MD   1 year ago Acute hypoxemic respiratory failure due to COVID-19 [facility])   [place] Susy Frizzle, MD   2 years ago Controlled type 2 diabetes mellitus without complication, without long-term current use of insulin ([place])   [place] Pickard, Cammie Mcgee, MD   3 years ago Encounter for Medicare annual wellness exam   [place] Delsa Grana, PA-C   5 years ago Need for prophylactic vaccination against Streptococcus pneumoniae (pneumococcus)   [place] Pickard, Cammie Mcgee, MD       Future Appointments             In 4 weeks Pickard, Cammie Mcgee, MD [place]

## 2021-08-31 ENCOUNTER — Telehealth: Payer: Self-pay

## 2021-08-31 NOTE — Telephone Encounter (Signed)
Pt came into office to request a refill of FREESTYLE LITE test strip [[id number]]. Pt states that he has not been able to check his blood sugars for 3 weeks due to being out of these strips. Pt states that the pharmacy is charging to much money for these strips. Pt would like to know if there is a way that he could get some assistance with this as pt would like to continue to use this type of strips. Please advise.  Cb#: [phone]

## 2021-09-01 ENCOUNTER — Telehealth: Payer: Self-pay

## 2021-09-01 MED ORDER — ONETOUCH ULTRA 2 W/DEVICE KIT: PACK | 1 refills | Status: DC

## 2021-09-01 MED ORDER — GLUCOSE BLOOD VI STRP
ORAL_STRIP | 12 refills | Status: DC
Start: 2021-09-01 — End: 2022-01-12

## 2021-09-01 NOTE — Telephone Encounter (Signed)
Reci' fax from [facility] regarding test strips 08/30/21.  This morning, Spoke with Pharmacist at [facility], Per pharmacist's pt's insurance is no longer covering the glucose blood (freestyle Lite) test strips.  Per Dr. [name] to verbally called in the preferred band, per pharmacist the pref. Brand is the One Touch which pt's insurance will cover.   Pharmacist will refill pt's Rx with the whole new set up, incl. strips,meters and lancets.   Confirm change with pt. Pt voiced understanding. Nothing at this time.

## 2021-09-28 ENCOUNTER — Ambulatory Visit (INDEPENDENT_AMBULATORY_CARE_PROVIDER_SITE_OTHER): Payer: PPO | Admitting: Family Medicine

## 2021-09-28 VITALS — BP 120/70 | HR 81 | Temp 98.6°F | Ht 66.0 in | Wt 209.0 lb

## 2021-09-28 DIAGNOSIS — I10 Essential (primary) hypertension: Secondary | ICD-10-CM | POA: Diagnosis not present

## 2021-09-28 DIAGNOSIS — E1169 Type 2 diabetes mellitus with other specified complication: Secondary | ICD-10-CM

## 2021-09-28 NOTE — Progress Notes (Signed)
Subjective:    Patient ID: Patrick Luna, male    DOB: 05-Apr-1945, 76 y.o.   MRN: [id number]  HPI  Patient is a very pleasant 76 year old Caucasian gentleman here today for checkup on his diabetes.  He is currently taking Jardiance and metformin.  Since I last saw the patient, he independently decrease metformin from 2000 to 1000 mg.  He did this due to profound fatigue and lack of drive and lack of energy.  He states that his symptoms are now at night and day better.  Since he reduced the metformin, he states that he has much more energy and stamina and drive.  His reported blood sugars in the morning are between 101 120.  He denies any hypoglycemic episodes.  He denies any hypoglycemia.  He denies any abdominal pain, nausea, vomiting, diarrhea.  He denies any chest pain or shortness of breath.  His blood pressure today is outstanding at 120/70 Past Medical History:  Diagnosis Date   Chronic sinusitis    Diabetes mellitus without complication (HCC)    GERD (gastroesophageal reflux disease)    Hepatic steatosis    Hypercholesterolemia    [place] esophagus    [facility], botox, dilation, isosorbide   Past Surgical History:  Procedure Laterality Date   COLONOSCOPY  2007   CYSTECTOMY     BENIGN CYST REMOVAL FROM RIGHT SHOULDER   ESOPHAGEAL MANOMETRY N/A 02/02/2013   Procedure: ESOPHAGEAL MANOMETRY (EM);  Surgeon: Cleotis Nipper, MD;  Location: [facility];  Service: Endoscopy;  Laterality: N/A;   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 6.7 g 0   Ascorbic Acid (VITAMIN C) 1000 MG tablet Take 1,000 mg by mouth daily.     aspirin EC 81 MG tablet Take 81 mg by mouth daily.     atorvastatin (LIPITOR) 20 MG tablet TAKE 1 TABLET BY MOUTH EVERY DAY 90 tablet 1   Blood Glucose Monitoring Suppl (ONE TOUCH ULTRA 2) w/Device KIT Check Blood sugar twice a day 1 kit 1   empagliflozin (JARDIANCE) 25 MG TABS tablet TAKE 1 TABLET BY MOUTH EVERY DAY BEFORE BREAKFAST 90 tablet 3   glucose blood test strip Use as instructed 100 each 12   Lancets (FREESTYLE) lancets Check BS BID - DX: E11.9 (Patient taking differently: 1 each by Other route as directed. Check BS BID - DX: E11.9) 200 each 3   lisinopril (ZESTRIL) 10 MG tablet TAKE 1 TABLET BY MOUTH EVERY DAY 90 tablet 3   metFORMIN (GLUCOPHAGE-XR) 500 MG 24 hr tablet TAKE 4 TABLETS BY MOUTH EVERY DAY WITH BREAKFAST 360 tablet 1   nitroGLYCERIN (NITROSTAT) 0.4 MG SL tablet Place 0.4 mg under the tongue every 5 (five) minutes as needed for chest pain.     polyethylene glycol-electrolytes (NULYTELY) 420 g solution See admin instructions.     Vitamin D3 (VITAMIN D) 25 MCG tablet Take 2,000 Units by mouth daily.     zinc gluconate 50 MG tablet Take 30 mg by  mouth daily.     No current facility-administered medications on file prior to visit.   Allergies  Allergen Reactions   Ampicillin Rash   Social History   Socioeconomic History   Marital status: Married    Spouse name: Sunday Spillers   Number of children: 1   Years of education: Not on file   Highest education level: Not on file  Occupational History   Not on file  Tobacco Use   Smoking status: Former    Types: Cigarettes    Quit date: 11/27/1968    Years since quitting: 52.8   Smokeless tobacco: Never   Tobacco comments:    QUIT 1970  Vaping Use   Vaping Use: Never used  Substance and Sexual Activity   Alcohol use: No   Drug use: No   Sexual  activity: Not Currently  Other Topics Concern   Not on file  Social History Narrative   1 daughter, 2 grandchildren, 1 great grandchild   Social Determinants of Health   Financial Resource Strain: Low Risk  (12/08/2020)   Overall Financial Resource Strain (CARDIA)    Difficulty of Paying Living Expenses: Not hard at all  Food Insecurity: No Food Insecurity (12/08/2020)   Hunger Vital Sign    Worried About Running Out of Food in the Last Year: Never true    [place] in the Last Year: Never true  Transportation Needs: No Transportation Needs (12/08/2020)   PRAPARE - Hydrologist (Medical): No    Lack of Transportation (Non-Medical): No  Physical Activity: Insufficiently Active (12/08/2020)   Exercise Vital Sign    Days of Exercise per Week: 5 days    Minutes of Exercise per Session: 20 min  Stress: No Stress Concern Present (12/08/2020)   [place]    Feeling of Stress : Not at all  Social Connections: [place] (12/08/2020)   Social Connection and Isolation Panel [NHANES]    Frequency of Communication with Friends and Family: More than three times a week    Frequency of Social Gatherings with Friends and Family: More than three times a week    Attends Religious Services: More than 4 times per year    Active Member of [employer] or Organizations: Yes    Attends Archivist Meetings: More than 4 times per year    Marital Status: Married  Human resources officer Violence: Not At Risk (12/08/2020)   Humiliation, Afraid, Rape, and Kick questionnaire    Fear of Current or Ex-Partner: No    Emotionally Abused: No    Physically Abused: No    Sexually Abused: No      Review of Systems  All other systems reviewed and are negative.      Objective:   Physical Exam Vitals reviewed.  Constitutional:      Appearance: He is well-developed.  Neck:     Thyroid: No  thyromegaly.     Vascular: No JVD.  Cardiovascular:     Rate and Rhythm: Normal rate and regular rhythm.     Heart sounds: Normal heart sounds. No murmur heard. Pulmonary:     Effort: Pulmonary effort is normal. No respiratory distress.     Breath sounds: Normal breath sounds. No wheezing or rales.  Abdominal:     General: Bowel sounds are normal. There is no distension.     Palpations: Abdomen is soft.     Tenderness: There is no abdominal  tenderness. There is no guarding or rebound.           Assessment & Plan:  Type 2 diabetes mellitus with other specified complication, without long-term current use of insulin (HCC) - Plan: Hemoglobin A1c, CBC with Differential/Platelet, COMPLETE METABOLIC PANEL WITH GFR  Benign essential HTN Fatigue may have been related to hypoglycemia.  However he seems to be doing much better on the lower dose of metformin.  I will check his A1c today.  If less than 6.5 we may discontinue metformin altogether.  Goal would be less than 7.  Check CBC, CMP, and A1c.

## 2021-09-29 ENCOUNTER — Other Ambulatory Visit: Payer: Self-pay | Admitting: Family Medicine

## 2021-09-29 LAB — COMPLETE METABOLIC PANEL WITH GFR
AG Ratio: 2 (calc) (ref 1.0–2.5)
ALT: 30 U/L (ref 9–46)
AST: 22 U/L (ref 10–35)
Albumin: 4.3 g/dL (ref 3.6–5.1)
Alkaline phosphatase (APISO): 57 U/L (ref 35–144)
BUN: 12 mg/dL (ref 7–25)
CO2: 25 mmol/L (ref 20–32)
Calcium: 9.8 mg/dL (ref 8.6–10.3)
Chloride: 102 mmol/L (ref 98–110)
Creat: 0.83 mg/dL (ref 0.70–1.28)
Globulin: 2.2 g/dL (calc) (ref 1.9–3.7)
Glucose, Bld: 118 mg/dL — ABNORMAL HIGH (ref 65–99)
Potassium: 4.2 mmol/L (ref 3.5–5.3)
Sodium: 138 mmol/L (ref 135–146)
Total Bilirubin: 0.9 mg/dL (ref 0.2–1.2)
Total Protein: 6.5 g/dL (ref 6.1–8.1)
eGFR: 92 mL/min/{1.73_m2} (ref >=60)

## 2021-09-29 LAB — CBC WITH DIFFERENTIAL/PLATELET
Absolute Monocytes: 689 cells/uL (ref 200–950)
Basophils Absolute: 50 cells/uL (ref 0–200)
Basophils Relative: 0.7 %
Eosinophils Absolute: 199 cells/uL (ref 15–500)
Eosinophils Relative: 2.8 %
HCT: 47.3 % (ref 38.5–50.0)
Hemoglobin: 15.9 g/dL (ref 13.2–17.1)
Lymphs Abs: 2222 cells/uL (ref 850–3900)
MCH: 31 pg (ref 27.0–33.0)
MCHC: 33.6 g/dL (ref 32.0–36.0)
MCV: 92.2 fL (ref 80.0–100.0)
MPV: 12 fL (ref 7.5–12.5)
Monocytes Relative: 9.7 %
Neutro Abs: 3941 cells/uL (ref 1500–7800)
Neutrophils Relative %: 55.5 %
Platelets: 164 10*3/uL (ref 140–400)
RBC: 5.13 10*6/uL (ref 4.20–5.80)
RDW: 13 % (ref 11.0–15.0)
Total Lymphocyte: 31.3 %
WBC: 7.1 10*3/uL (ref 3.8–10.8)

## 2021-09-29 LAB — HEMOGLOBIN A1C
Hgb A1c MFr Bld: 6.4 % of total Hgb — ABNORMAL HIGH (ref <5.7)
Mean Plasma Glucose: 137 mg/dL
eAG (mmol/L): 7.6 mmol/L

## 2021-09-29 MED ORDER — METFORMIN HCL ER 500 MG PO TB24: 1000.0000 mg | ORAL_TABLET | Freq: Every day | ORAL | 1 refills | Status: DC

## 2021-10-08 ENCOUNTER — Other Ambulatory Visit: Payer: Self-pay | Admitting: Family Medicine

## 2021-10-09 NOTE — Telephone Encounter (Signed)
Refilled 07/06/2021 #90 1 refill. Requested Prescriptions  Pending Prescriptions Disp Refills  . atorvastatin (LIPITOR) 20 MG tablet [Pharmacy Med Name: ATORVASTATIN 20 MG TABLET] 90 tablet 1    Sig: TAKE 1 TABLET BY MOUTH EVERY DAY     Cardiovascular:  Antilipid - Statins Failed - 10/08/2021  5:29 PM      Failed - Lipid Panel in normal range within the last 12 months    Cholesterol  Date Value Ref Range Status  03/30/2021 97 <200 mg/dL Final   LDL Cholesterol (Calc)  Date Value Ref Range Status  03/30/2021 36 mg/dL (calc) Final    Comment:    Reference range: <100 . Desirable range <100 mg/dL for primary prevention;   <70 mg/dL for patients with CHD or diabetic patients  with > or = 2 CHD risk factors. Marland Kitchen LDL-C is now calculated using the Martin-Hopkins  calculation, which is a validated novel method providing  better accuracy than the Friedewald equation in the  estimation of LDL-C.  Cresenciano Genre et al. Annamaria Helling. 0388;828(00): 2061-2068  (http://education.[facility].com/faq/FAQ164)    HDL  Date Value Ref Range Status  03/30/2021 41 > OR = 40 mg/dL Final   Triglycerides  Date Value Ref Range Status  03/30/2021 113 <150 mg/dL Final         Passed - Patient is not pregnant      Passed - Valid encounter within last 12 months    Recent Outpatient Visits          6 months ago Type 2 diabetes mellitus with other specified complication, without long-term current use of insulin ([place])   [place] Pickard, Cammie Mcgee, MD   1 year ago Acute hypoxemic respiratory failure due to COVID-19 [facility])   [place] Susy Frizzle, MD   2 years ago Controlled type 2 diabetes mellitus without complication, without long-term current use of insulin ([place])   [place] Pickard, Cammie Mcgee, MD   4 years ago Encounter for Medicare annual wellness exam   [place] Delsa Grana, PA-C   5 years ago Need for prophylactic  vaccination against Streptococcus pneumoniae (pneumococcus)   [facility] Pickard, Cammie Mcgee, MD

## 2021-10-24 ENCOUNTER — Other Ambulatory Visit: Payer: Self-pay | Admitting: Family Medicine

## 2021-10-25 NOTE — Telephone Encounter (Signed)
Requested medication (s) are due for refill today: no  Requested medication (s) are on the active medication list: yes  Last refill:  07/06/21, 09/29/21  Future visit scheduled: no  Notes to clinic:  Unable to refill per protocol, Rx request is too soon, last RF for Lipitor 07/06/21 for 90 and 1. Metformin RF 09/29/21 for 360. Routing for review.     Requested Prescriptions  Pending Prescriptions Disp Refills   atorvastatin (LIPITOR) 20 MG tablet [Pharmacy Med Name: ATORVASTATIN 20 MG TABLET] 90 tablet 1    Sig: TAKE 1 TABLET BY MOUTH EVERY DAY     Cardiovascular:  Antilipid - Statins Failed - 10/24/2021  9:30 AM      Failed - Lipid Panel in normal range within the last 12 months    Cholesterol  Date Value Ref Range Status  03/30/2021 97 <200 mg/dL Final   LDL Cholesterol (Calc)  Date Value Ref Range Status  03/30/2021 36 mg/dL (calc) Final    Comment:    Reference range: <100 . Desirable range <100 mg/dL for primary prevention;   <70 mg/dL for patients with CHD or diabetic patients  with > or = 2 CHD risk factors. . LDL-C is now calculated using the Martin-Hopkins  calculation, which is a validated novel method providing  better accuracy than the Friedewald equation in the  estimation of LDL-C.  Martin SS et al. JAMA. 2013;310(19): 2061-2068  (http://education.[facility].com/faq/FAQ164)    HDL  Date Value Ref Range Status  03/30/2021 41 > OR = 40 mg/dL Final   Triglycerides  Date Value Ref Range Status  03/30/2021 113 <150 mg/dL Final         Passed - Patient is not pregnant      Passed - Valid encounter within last 12 months    Recent Outpatient Visits           6 months ago Type 2 diabetes mellitus with other specified complication, without long-term current use of insulin (HCC)   [facility] Pickard, Warren T, MD   2 years ago Acute hypoxemic respiratory failure due to COVID-19 (HCC)   [facility] Pickard, Warren T,  MD   2 years ago Controlled type 2 diabetes mellitus without complication, without long-term current use of insulin (HCC)   [facility] Pickard, Warren T, MD   4 years ago Encounter for Medicare annual wellness exam   [facility] Tapia, Leisa, PA-C   5 years ago Need for prophylactic vaccination against Streptococcus pneumoniae (pneumococcus)   [facility] Pickard, Warren T, MD               metFORMIN (GLUCOPHAGE-XR) 500 MG 24 hr tablet [Pharmacy Med Name: METFORMIN HCL ER 500 MG TABLET] 360 tablet 1    Sig: TAKE 4 TABLETS BY MOUTH EVERY DAY WITH BREAKFAST     Endocrinology:  Diabetes - Biguanides Failed - 10/24/2021  9:30 AM      Failed - B12 Level in normal range and within 720 days    No results found for: "VITAMINB12"       Failed - Valid encounter within last 6 months    Recent Outpatient Visits           6 months ago Type 2 diabetes mellitus with other specified complication, without long-term current use of insulin (HCC)   [facility] Pickard, Warren T, MD   2 years ago Acute hypoxemic respiratory failure due to   COVID-19 [facility])   [place] Pickard, Cammie Mcgee, MD   2 years ago Controlled type 2 diabetes mellitus without complication, without long-term current use of insulin ([place])   [place] Pickard, Cammie Mcgee, MD   4 years ago Encounter for Medicare annual wellness exam   [place] Delsa Grana, PA-C   5 years ago Need for prophylactic vaccination against Streptococcus pneumoniae (pneumococcus)   [place] Pickard, Cammie Mcgee, MD              Passed - Cr in normal range and within 360 days    Creat  Date Value Ref Range Status  09/28/2021 0.83 0.70 - 1.28 mg/dL Final   Creatinine, Urine  Date Value Ref Range Status  10/12/2019 87.69 mg/dL Final    Comment:    Performed at [facility], [address]  [place], [place] [address]         Passed - HBA1C is between 0 and 7.9 and within 180 days    Hgb A1C (fingerstick)  Date Value Ref Range Status  01/14/2013 5.8 (H) <5.7 % Final    Comment:                                                                           According to the ADA Clinical Practice Recommendations for 2011, when HbA1c is used as a screening test:     >=6.5%   Diagnostic of Diabetes Mellitus            (if abnormal result is confirmed)   5.7-6.4%   Increased risk of developing Diabetes Mellitus   References:Diagnosis and Classification of Diabetes Mellitus,Diabetes PTWS,5681,27(NTZGY 1):S62-S69 and Standards of Medical Care in         Diabetes - 2011,Diabetes FVCB,4496,75 (Suppl 1):S11-S61.   PERFORMED BY [facility]   Hgb A1c MFr Bld  Date Value Ref Range Status  09/28/2021 6.4 (H) <5.7 % of total Hgb Final    Comment:    For someone without known diabetes, a hemoglobin  A1c value between 5.7% and 6.4% is consistent with prediabetes and should be confirmed with a  follow-up test. . For someone with known diabetes, a value <7% indicates that their diabetes is well controlled. A1c targets should be individualized based on duration of diabetes, age, comorbid conditions, and other considerations. . This assay result is consistent with an increased risk of diabetes. . Currently, no consensus exists regarding use of hemoglobin A1c for diagnosis of diabetes for children. .          Passed - eGFR in normal range and within 360 days    GFR, Est African American  Date Value Ref Range Status  10/22/2019 103 > OR = 60 mL/min/1.68m Final   GFR, Est Non African American  Date Value Ref Range Status  10/22/2019 89 > OR = 60 mL/min/1.754mFinal   eGFR  Date Value Ref Range Status  09/28/2021 92 > OR = 60 mL/min/1.7370minal         Passed - CBC within normal limits and completed in the last 12 months    WBC  Date Value Ref Range Status  09/28/2021 7.1  3.8 -  10.8 Thousand/uL Final   RBC  Date Value Ref Range Status  09/28/2021 5.13 4.20 - 5.80 Million/uL Final   Hemoglobin  Date Value Ref Range Status  09/28/2021 15.9 13.2 - 17.1 g/dL Final   HCT  Date Value Ref Range Status  09/28/2021 47.3 38.5 - 50.0 % Final   MCHC  Date Value Ref Range Status  09/28/2021 33.6 32.0 - 36.0 g/dL Final   [facility]  Date Value Ref Range Status  09/28/2021 31.0 27.0 - 33.0 pg Final   MCV  Date Value Ref Range Status  09/28/2021 92.2 80.0 - 100.0 fL Final   No results found for: "PLTCOUNTKUC", "LABPLAT", "POCPLA" RDW  Date Value Ref Range Status  09/28/2021 13.0 11.0 - 15.0 % Final

## 2021-11-08 DIAGNOSIS — D044 Carcinoma in situ of skin of scalp and neck: Secondary | ICD-10-CM | POA: Diagnosis not present

## 2021-11-08 DIAGNOSIS — D485 Neoplasm of uncertain behavior of skin: Secondary | ICD-10-CM | POA: Diagnosis not present

## 2021-11-08 DIAGNOSIS — L570 Actinic keratosis: Secondary | ICD-10-CM | POA: Diagnosis not present

## 2021-11-08 DIAGNOSIS — Z85828 Personal history of other malignant neoplasm of skin: Secondary | ICD-10-CM | POA: Diagnosis not present

## 2021-11-08 DIAGNOSIS — L821 Other seborrheic keratosis: Secondary | ICD-10-CM | POA: Diagnosis not present

## 2021-12-13 ENCOUNTER — Telehealth: Payer: Self-pay | Admitting: Family Medicine

## 2021-12-13 NOTE — Telephone Encounter (Signed)
Called patient to reschedule upcoming Medicare AWV appt; [facility] out of office 12/20/21. No answer/no voicemail set up.

## 2022-01-12 ENCOUNTER — Ambulatory Visit (INDEPENDENT_AMBULATORY_CARE_PROVIDER_SITE_OTHER): Payer: PPO

## 2022-01-12 ENCOUNTER — Other Ambulatory Visit: Payer: Self-pay

## 2022-01-12 VITALS — Ht 66.0 in | Wt 209.0 lb

## 2022-01-12 DIAGNOSIS — E1169 Type 2 diabetes mellitus with other specified complication: Secondary | ICD-10-CM

## 2022-01-12 DIAGNOSIS — Z0000 Encounter for general adult medical examination without abnormal findings: Secondary | ICD-10-CM

## 2022-01-12 MED ORDER — ATORVASTATIN CALCIUM 20 MG PO TABS: 20.0000 mg | ORAL_TABLET | Freq: Every day | ORAL | 3 refills | Status: DC

## 2022-01-12 NOTE — Patient Instructions (Signed)
[name] , Thank you for taking time to come for your Medicare Wellness Visit. I appreciate your ongoing commitment to your health goals. Please review the following plan we discussed and let me know if I can assist you in the future.   These are the goals we discussed:  Goals      Care Plan:     CARE PLAN ENTRY  Current Barriers:  Chronic Disease Management support, education, and care coordination needs related to Hyperlipidemia and Diabetes  Hyperlipidemia Pharmacist Clinical Goal(s): Over the next 90 days, patient will work with PharmD and providers to maintain LDL goal < 100 Current regimen:  Atorvastatin '20mg'$  daily Interventions: Continue current medication Patient self care activities - Over the next 90 days, patient will: Continue to take medication as directed Contact PharmD or PCP with and questions or concerns  Diabetes Pharmacist Clinical Goal(s): Over the next 90 days, patient will work with PharmD and providers to achieve A1c goal <6.5% Current regimen:  Metformin '1000mg'$  twice daily Interventions: Recommend switching to metformin XR '500mg'$  (two tablets twice daily) in hopes of less side effects Patient self care activities - Over the next 90 days, patient will: Check blood sugar once daily, document, and provide at future appointments Contact provider with any episodes of hypoglycemia  Initial goal documentation      DIET - INCREASE WATER INTAKE     Weight (lb) < 200 lb (90.7 kg)     Diet and exercise to help decrease weight        This is a list of the screening recommended for you and due dates:  Health Maintenance  Topic Date Due   Yearly kidney health urinalysis for diabetes  02/09/2022*   Flu Shot  05/13/2022*   Zoster (Shingles) Vaccine (1 of 2) 02/08/2023*   Complete foot exam   03/30/2022   Hemoglobin A1C  03/31/2022   Eye exam for diabetics  06/30/2022   Yearly kidney function blood test for diabetes  09/29/2022   Medicare Annual Wellness  Visit  01/13/2023   Colon Cancer Screening  02/13/2024   Pneumonia Vaccine  Completed   Hepatitis C Screening: USPSTF Recommendation to screen - Ages 18-79 yo.  Completed   HPV Vaccine  Aged Out   DTaP/Tdap/Td vaccine  Discontinued   COVID-19 Vaccine  Discontinued  *Topic was postponed. The date shown is not the original due date.    Advanced directives: Please bring a copy of your health care power of attorney and living will to the office to be added to your chart at your convenience.   Conditions/risks identified: Aim for 30 minutes of exercise or brisk walking, 6-8 glasses of water, and 5 servings of fruits and vegetables each day.   Next appointment: Follow up in one year for your annual wellness visit. 01/2023  Preventive Care 65 Years and Older, Male  Preventive care refers to lifestyle choices and visits with your health care provider that can promote health and wellness. What does preventive care include? A yearly physical exam. This is also called an annual well check. Dental exams once or twice a year. Routine eye exams. Ask your health care provider how often you should have your eyes checked. Personal lifestyle choices, including: Daily care of your teeth and gums. Regular physical activity. Eating a healthy diet. Avoiding tobacco and drug use. Limiting alcohol use. Practicing safe sex. Taking low doses of aspirin every day. Taking vitamin and mineral supplements as recommended by your health care provider. What  happens during an annual well check? The services and screenings done by your health care provider during your annual well check will depend on your age, overall health, lifestyle risk factors, and family history of disease. Counseling  Your health care provider may ask you questions about your: Alcohol use. Tobacco use. Drug use. Emotional well-being. Home and relationship well-being. Sexual activity. Eating habits. History of falls. Memory and  ability to understand (cognition). Work and work Statistician. Screening  You may have the following tests or measurements: Height, weight, and BMI. Blood pressure. Lipid and cholesterol levels. These may be checked every 5 years, or more frequently if you are over 33 years old. Skin check. Lung cancer screening. You may have this screening every year starting at age 59 if you have a 30-pack-year history of smoking and currently smoke or have quit within the past 15 years. Fecal occult blood test (FOBT) of the stool. You may have this test every year starting at age 49. Flexible sigmoidoscopy or colonoscopy. You may have a sigmoidoscopy every 5 years or a colonoscopy every 10 years starting at age 54. Prostate cancer screening. Recommendations will vary depending on your family history and other risks. Hepatitis C blood test. Hepatitis B blood test. Sexually transmitted disease (STD) testing. Diabetes screening. This is done by checking your blood sugar (glucose) after you have not eaten for a while (fasting). You may have this done every 1-3 years. Abdominal aortic aneurysm (AAA) screening. You may need this if you are a current or former smoker. Osteoporosis. You may be screened starting at age 36 if you are at high risk. Talk with your health care provider about your test results, treatment options, and if necessary, the need for more tests. Vaccines  Your health care provider may recommend certain vaccines, such as: Influenza vaccine. This is recommended every year. Tetanus, diphtheria, and acellular pertussis (Tdap, Td) vaccine. You may need a Td booster every 10 years. Zoster vaccine. You may need this after age 49. Pneumococcal 13-valent conjugate (PCV13) vaccine. One dose is recommended after age 17. Pneumococcal polysaccharide (PPSV23) vaccine. One dose is recommended after age 28. Talk to your health care provider about which screenings and vaccines you need and how often you need  them. This information is not intended to replace advice given to you by your health care provider. Make sure you discuss any questions you have with your health care provider. Document Released: 02/25/2015 Document Revised: 10/19/2015 Document Reviewed: 11/30/2014 Elsevier Interactive Patient Education  2017 [employer] Prevention in the Home Falls can cause injuries. They can happen to people of all ages. There are many things you can do to make your home safe and to help prevent falls. What can I do on the outside of my home? Regularly fix the edges of walkways and driveways and fix any cracks. Remove anything that might make you trip as you walk through a door, such as a raised step or threshold. Trim any bushes or trees on the path to your home. Use bright outdoor lighting. Clear any walking paths of anything that might make someone trip, such as rocks or tools. Regularly check to see if handrails are loose or broken. Make sure that both sides of any steps have handrails. Any raised decks and porches should have guardrails on the edges. Have any leaves, snow, or ice cleared regularly. Use sand or salt on walking paths during winter. Clean up any spills in your garage right away. This includes oil  or grease spills. What can I do in the bathroom? Use night lights. Install grab bars by the toilet and in the tub and shower. Do not use towel bars as grab bars. Use non-skid mats or decals in the tub or shower. If you need to sit down in the shower, use a plastic, non-slip stool. Keep the floor dry. Clean up any water that spills on the floor as soon as it happens. Remove soap buildup in the tub or shower regularly. Attach bath mats securely with double-sided non-slip rug tape. Do not have throw rugs and other things on the floor that can make you trip. What can I do in the bedroom? Use night lights. Make sure that you have a light by your bed that is easy to reach. Do not use  any sheets or blankets that are too big for your bed. They should not hang down onto the floor. Have a firm chair that has side arms. You can use this for support while you get dressed. Do not have throw rugs and other things on the floor that can make you trip. What can I do in the kitchen? Clean up any spills right away. Avoid walking on wet floors. Keep items that you use a lot in easy-to-reach places. If you need to reach something above you, use a strong step stool that has a grab bar. Keep electrical cords out of the way. Do not use floor polish or wax that makes floors slippery. If you must use wax, use non-skid floor wax. Do not have throw rugs and other things on the floor that can make you trip. What can I do with my stairs? Do not leave any items on the stairs. Make sure that there are handrails on both sides of the stairs and use them. Fix handrails that are broken or loose. Make sure that handrails are as long as the stairways. Check any carpeting to make sure that it is firmly attached to the stairs. Fix any carpet that is loose or worn. Avoid having throw rugs at the top or bottom of the stairs. If you do have throw rugs, attach them to the floor with carpet tape. Make sure that you have a light switch at the top of the stairs and the bottom of the stairs. If you do not have them, ask someone to add them for you. What else can I do to help prevent falls? Wear shoes that: Do not have high heels. Have rubber bottoms. Are comfortable and fit you well. Are closed at the toe. Do not wear sandals. If you use a stepladder: Make sure that it is fully opened. Do not climb a closed stepladder. Make sure that both sides of the stepladder are locked into place. Ask someone to hold it for you, if possible. Clearly mark and make sure that you can see: Any grab bars or handrails. First and last steps. Where the edge of each step is. Use tools that help you move around (mobility aids)  if they are needed. These include: Canes. Walkers. Scooters. Crutches. Turn on the lights when you go into a dark area. Replace any light bulbs as soon as they burn out. Set up your furniture so you have a clear path. Avoid moving your furniture around. If any of your floors are uneven, fix them. If there are any pets around you, be aware of where they are. Review your medicines with your doctor. Some medicines can make you feel dizzy. This can  increase your chance of falling. Ask your doctor what other things that you can do to help prevent falls. This information is not intended to replace advice given to you by your health care provider. Make sure you discuss any questions you have with your health care provider. Document Released: 11/25/2008 Document Revised: 07/07/2015 Document Reviewed: 03/05/2014 Elsevier Interactive Patient Education  2017 [employer].

## 2022-01-12 NOTE — Progress Notes (Signed)
Subjective:   Patrick Luna is a 76 y.o. male who presents for Medicare Annual/Subsequent preventive examination. Virtual Visit via Telephone Note  I connected with  Patrick Luna on 01/12/22 at  9:00 AM EST by telephone and verified that I am speaking with the correct person using two identifiers.  Location: Patient: HOME Provider: [facility] Persons participating in the virtual visit: patient/Nurse Health Advisor   I discussed the limitations, risks, security and privacy concerns of performing an evaluation and management service by telephone and the availability of in person appointments. The patient expressed understanding and agreed to proceed.  Interactive audio and video telecommunications were attempted between this nurse and patient, however failed, due to patient having technical difficulties OR patient did not have access to video capability.  We continued and completed visit with audio only.  Some vital signs may be absent or patient reported.   Chriss Driver, LPN  Review of Systems     Cardiac Risk Factors include: advanced age (>65mn, >>11women);diabetes mellitus;dyslipidemia;male gender;hypertension;sedentary lifestyle;obesity (BMI >30kg/m2)     Objective:    Today's Vitals   01/12/22 0847  Weight: 209 lb (94.8 kg)  Height: _0  (1.676 m)   Body mass index is 33.73 kg/m.     01/12/2022    8:59 AM 12/08/2020    2:21 PM 10/10/2019    4:50 AM 10/04/2019   12:53 PM 09/06/2017    2:53 PM 07/20/2013    2:47 PM  Advanced Directives  Does Patient Have a Medical Advance Directive? Yes Yes No No No Patient does not have advance directive;Patient would not like information  Type of AScientist, forensicPower of [place] will [place] will      Copy of [place] Chart? No - copy requested No - copy requested      Would patient like information on creating a medical advance directive?   No - Patient  declined No - Patient declined      Current Medications (verified) Outpatient Encounter Medications as of 01/12/2022  Medication Sig   Ascorbic Acid (VITAMIN C) 1000 MG tablet Take 1,000 mg by mouth daily.   aspirin EC 81 MG tablet Take 81 mg by mouth daily.   atorvastatin (LIPITOR) 20 MG tablet TAKE 1 TABLET BY MOUTH EVERY DAY   Blood Glucose Monitoring Suppl (ONE TOUCH ULTRA 2) w/Device KIT Check Blood sugar twice a day   empagliflozin (JARDIANCE) 25 MG TABS tablet TAKE 1 TABLET BY MOUTH EVERY DAY BEFORE BREAKFAST   Lancets (FREESTYLE) lancets Check BS BID - DX: E11.9   lisinopril (ZESTRIL) 10 MG tablet TAKE 1 TABLET BY MOUTH EVERY DAY   metFORMIN (GLUCOPHAGE-XR) 500 MG 24 hr tablet TAKE 4 TABLETS BY MOUTH EVERY DAY WITH BREAKFAST   nitroGLYCERIN (NITROSTAT) 0.4 MG SL tablet Place 0.4 mg under the tongue every 5 (five) minutes as needed for chest pain.   polyethylene glycol-electrolytes (NULYTELY) 420 g solution See admin instructions.   Vitamin D3 (VITAMIN D) 25 MCG tablet Take 2,000 Units by mouth daily.   [DISCONTINUED] albuterol (VENTOLIN HFA) 108 (90 Base) MCG/ACT inhaler Inhale 2 puffs into the lungs every 6 (six) hours as needed for wheezing or shortness of breath. (Patient not taking: Reported on 09/28/2021)   [DISCONTINUED] glucose blood test strip Use as instructed (Patient not taking: Reported on 09/28/2021)   [DISCONTINUED] zinc gluconate 50 MG tablet Take 30 mg by mouth daily. (Patient not taking: Reported on 09/28/2021)  No facility-administered encounter medications on file as of 01/12/2022.    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[facility], botox, dilation, isosorbide   Past Surgical History:  Procedure Laterality Date   COLONOSCOPY  2007    CYSTECTOMY     BENIGN CYST REMOVAL FROM RIGHT SHOULDER   ESOPHAGEAL MANOMETRY N/A 02/02/2013   Procedure: ESOPHAGEAL MANOMETRY (EM);  Surgeon: Cleotis Nipper, MD;  Location: [facility];  Service: Endoscopy;  Laterality: N/A;   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 07/23/2013   Procedure: RIGHT FOOT: EXCISION PARTIAL BONE TALUS/CALCANEUS, REPAIR RUPTURE  ACHILLES TENDON PRIMARY OPEN/PERCUTANEOUS;  Surgeon: Ninetta Lights, MD;  Location: [facility];  Service: Orthopedics;  Laterality: Right;   HAND DEBRIDEMENT  2009   left   Family History  Problem Relation Age of Onset   Hypertension Mother    Hepatitis C Sister    Social History   Socioeconomic History   Marital status: Married    Spouse name: Sunday Spillers   Number of children: 1   Years of education: Not on file   Highest education level: Not on file  Occupational History   Not on file  Tobacco Use   Smoking status: Former    Types: Cigarettes    Quit date: 11/27/1968    Years since quitting: 53.1   Smokeless tobacco: Never   Tobacco comments:    QUIT 1970  Vaping Use   Vaping Use: Never used  Substance and Sexual Activity   Alcohol use: No   Drug use: No   Sexual activity: Not Currently  Other Topics Concern   Not on file  Social History Narrative   1 daughter, 2 grandchildren, 1 great grandchild   Social Determinants of Health   Financial Resource Strain: Low Risk  (01/12/2022)   Overall Financial Resource Strain (CARDIA)    Difficulty of Paying Living Expenses: Not hard at all  Food Insecurity: No Food Insecurity (01/12/2022)   Hunger Vital Sign    Worried About Running Out of Food in the Last Year: Never true    [place] in the Last Year: Never true  Transportation Needs: No Transportation Needs (01/12/2022)   PRAPARE - Hydrologist (Medical): No    Lack of Transportation (Non-Medical): No  Physical Activity: Sufficiently Active  (01/12/2022)   Exercise Vital Sign    Days of Exercise per Week: 5 days    Minutes of Exercise per Session: 30 min  Stress: No Stress Concern Present (01/12/2022)   [place]    Feeling of Stress : Not at all  Social Connections: [place] (01/12/2022)   Social Connection and Isolation Panel [NHANES]    Frequency of Communication with Friends and Family: More than three times a week    Frequency of Social Gatherings with Friends and Family: More than three times a week    Attends Religious Services: More than 4 times per year    Active Member of [employer] or Organizations: Yes    Attends Music therapist: More than 4 times per year    Marital Status: Married    Tobacco Counseling Counseling given: Not Answered Tobacco comments: [place]  Clinical Intake:  Pre-visit preparation completed: Yes  Pain : No/denies pain     BMI - recorded: 33.73 Nutritional Status: BMI > 30  Obese Nutritional Risks: None Diabetes: Yes CBG done?: No Did pt. bring in CBG monitor from home?: No  How often do you need to have someone help you when you read instructions, pamphlets, or other written materials from your doctor or pharmacy?: 1 - Never  Diabetic? Nutrition Risk Assessment:  Has the patient had any N/V/D within the last 2 months?  Yes  Does the patient have any non-healing wounds?  No  Has the patient had any unintentional weight loss or weight gain?  No   Diabetes:  Is the patient diabetic?  Yes  If diabetic, was a CBG obtained today?  No  Did the patient bring in their glucometer from home?  No  How often do you monitor your CBG's? Daily in am.   Financial Strains and Diabetes Management:  Are you having any financial strains with the device, your supplies or your medication? No .  Does the patient want to be seen by Chronic Care Management for management of their diabetes?  No  Would the  patient like to be referred to a Nutritionist or for Diabetic Management?  No   Diabetic Exams:  Diabetic Eye Exam: Completed 06/23/2021. Overdue for diabetic eye exam. Pt has been advised about the importance in completing this exam.  Diabetic Foot Exam: Completed DUE. Pt has been advised about the importance in completing this exam.    Interpreter Needed?: No  Information entered by :: mj Mario Voong,lpn   Activities of Daily Living    01/12/2022    9:00 AM  In your present state of health, do you have any difficulty performing the following activities:  Hearing? 0  Vision? 0  Difficulty concentrating or making decisions? 0  Walking or climbing stairs? 0  Dressing or bathing? 0  Doing errands, shopping? 0  Preparing Food and eating ? N  Using the Toilet? N  In the past six months, have you accidently leaked urine? N  Do you have problems with loss of bowel control? N  Managing your Medications? N  Managing your Finances? N  Housekeeping or managing your Housekeeping? N    Patient Care Team: Susy Frizzle, MD as PCP - General (Family Medicine) Satira Sark, MD as Consulting Physician (Cardiology) Edythe Clarity, [facility] as Pharmacist (Pharmacist)  Indicate any recent Medical Services you may have received from other than [facility] providers in the past year (date may be approximate).     Assessment:   This is a routine wellness examination for [employer].  Hearing/Vision screen Hearing Screening - Comments:: Some hearing issues. Constant ringing in ears.  Vision Screening - Comments:: No glasses. Dr. [name].   Dietary issues and exercise activities discussed: Current Exercise Habits: Home exercise routine, Type of exercise: walking, Time (Minutes): 30, Frequency (Times/Week): 5, Weekly Exercise (Minutes/Week): 150, Intensity: Mild, Exercise limited by: cardiac condition(s);orthopedic condition(s)   Goals Addressed             This Visit's Progress    DIET -  INCREASE WATER INTAKE         Depression Screen    01/12/2022    8:57 AM 09/28/2021    2:09 PM 12/08/2020    2:12 PM 04/07/2019    3:24 PM 09/06/2017    2:14 PM 06/18/2016    4:12 PM 11/19/2014    9:47 AM  PHQ  2/9 Scores  PHQ - 2 Score 0 0 0 0 0 0 0  PHQ- 9 Score      1 0    Fall Risk    01/12/2022    9:00 AM 09/28/2021    2:13 PM 12/08/2020    2:22 PM 04/07/2019    3:24 PM 09/06/2017    2:14 PM  Fall Risk   Falls in the past year? 1 0 1 0 No  Number falls in past yr: 0 0 0    Injury with Fall? 0 0 0    Risk for fall due to : History of fall(s);Impaired balance/gait;Orthopedic patient No Fall Risks Impaired balance/gait  History of fall(s)  Follow up Education provided;Falls prevention discussed Falls prevention discussed Falls prevention discussed Falls evaluation completed     FALL RISK PREVENTION PERTAINING TO THE HOME:  Any stairs in or around the home? Yes  If so, are there any without handrails? No  Home free of loose throw rugs in walkways, pet beds, electrical cords, etc? Yes  Adequate lighting in your home to reduce risk of falls? Yes   ASSISTIVE DEVICES UTILIZED TO PREVENT FALLS:  Life alert? No  Use of a cane, walker or w/c? No  Grab bars in the bathroom? No  Shower chair or bench in shower? No  Elevated toilet seat or a handicapped toilet? No   TIMED UP AND GO:  Was the test performed? No .  PHONE VISIT  Cognitive Function:        01/12/2022    9:01 AM 12/08/2020    2:26 PM  6CIT Screen  What Year? 0 points 0 points  What month? 0 points 0 points  What time? 0 points 0 points  Count back from 20 0 points 0 points  Months in reverse 0 points 0 points  Repeat phrase 0 points 0 points  Total Score 0 points 0 points    Immunizations Immunization History  Administered Date(s) Administered   Influenza,inj,Quad PF,6+ Mos 12/10/2014   Pneumococcal Conjugate-13 06/18/2016   Pneumococcal Polysaccharide-23 12/10/2014    TDAP status: Due, Education  has been provided regarding the importance of this vaccine. Advised may receive this vaccine at local pharmacy or Health Dept. Aware to provide a copy of the vaccination record if obtained from local pharmacy or Health Dept. Verbalized acceptance and understanding.  Flu Vaccine status: Declined, Education has been provided regarding the importance of this vaccine but patient still declined. Advised may receive this vaccine at local pharmacy or Health Dept. Aware to provide a copy of the vaccination record if obtained from local pharmacy or Health Dept. Verbalized acceptance and understanding.  Pneumococcal vaccine status: Up to date  Covid-19 vaccine status: Declined, Education has been provided regarding the importance of this vaccine but patient still declined. Advised may receive this vaccine at local pharmacy or Health Dept.or vaccine clinic. Aware to provide a copy of the vaccination record if obtained from local pharmacy or Health Dept. Verbalized acceptance and understanding.  Qualifies for Shingles Vaccine? Yes   Zostavax completed No   Shingrix Completed?: No.    Education has been provided regarding the importance of this vaccine. Patient has been advised to call insurance company to determine out of pocket expense if they have not yet received this vaccine. Advised may also receive vaccine at local pharmacy or Health Dept. Verbalized acceptance and understanding.  Screening Tests Health Maintenance  Topic Date Due   Diabetic kidney evaluation - Urine ACR  02/09/2022 (  Originally 04/14/2020)   INFLUENZA VACCINE  05/13/2022 (Originally 09/12/2021)   Zoster Vaccines- Shingrix (1 of 2) 02/08/2023 (Originally 01/08/1997)   FOOT EXAM  03/30/2022   HEMOGLOBIN A1C  03/31/2022   OPHTHALMOLOGY EXAM  06/30/2022   Diabetic kidney evaluation - GFR measurement  09/29/2022   Medicare Annual Wellness (AWV)  01/13/2023   COLONOSCOPY (Pts 45-40yr Insurance coverage will need to be confirmed)  02/13/2024    Pneumonia Vaccine 75 Years old  Completed   Hepatitis C Screening  Completed   HPV VACCINES  Aged Out   DTaP/Tdap/Td  Discontinued   COVID-19 Vaccine  Discontinued    Health Maintenance  There are no preventive care reminders to display for this patient.   Colorectal cancer screening: Type of screening: Colonoscopy. Completed 02/12/2014. Repeat every 10 years  Lung Cancer Screening: (Low Dose CT Chest recommended if Age 75-80years, 30 pack-year currently smoking OR have quit w/in 15years.) does not qualify.   Lung Cancer Screening Referral: N/A  Additional Screening:  Hepatitis C Screening: does qualify; Completed 06/21/2016  Vision Screening: Recommended annual ophthalmology exams for early detection of glaucoma and other disorders of the eye. Is the patient up to date with their annual eye exam?  Yes  Who is the provider or what is the name of the office in which the patient attends annual eye exams? DR. GKaty Fitch If pt is not established with a provider, would they like to be referred to a provider to establish care? No .   Dental Screening: Recommended annual dental exams for proper oral hygiene  Community Resource Referral / Chronic Care Management: CRR required this visit?  No   CCM required this visit?  No      Plan:     I have personally reviewed and noted the following in the patient's chart:   Medical and social history Use of alcohol, tobacco or illicit drugs  Current medications and supplements including opioid prescriptions. Patient is not currently taking opioid prescriptions. Functional ability and status Nutritional status Physical activity Advanced directives List of other physicians Hospitalizations, surgeries, and ER visits in previous 12 months Vitals Screenings to include cognitive, depression, and falls Referrals and appointments  In addition, I have reviewed and discussed with patient certain preventive protocols, quality metrics, and best  practice recommendations. A written personalized care plan for preventive services as well as general preventive health recommendations were provided to patient.     MChriss Driver LPN   [id number]  Nurse Notes: Discussed Shingrix and how to obtain. Pt also given samples of Jardiance 25 mg due to being in the donut hole.

## 2022-02-06 DIAGNOSIS — Z85828 Personal history of other malignant neoplasm of skin: Secondary | ICD-10-CM | POA: Diagnosis not present

## 2022-02-06 DIAGNOSIS — D044 Carcinoma in situ of skin of scalp and neck: Secondary | ICD-10-CM | POA: Diagnosis not present

## 2022-02-06 DIAGNOSIS — D485 Neoplasm of uncertain behavior of skin: Secondary | ICD-10-CM | POA: Diagnosis not present

## 2022-02-13 ENCOUNTER — Telehealth: Payer: Self-pay | Admitting: Family Medicine

## 2022-02-13 NOTE — Telephone Encounter (Signed)
Patient left a voicemail message requesting a call back from the nurse who did his AWV.   Please advise at [phone]

## 2022-02-28 DIAGNOSIS — R350 Frequency of micturition: Secondary | ICD-10-CM | POA: Diagnosis not present

## 2022-02-28 DIAGNOSIS — R3121 Asymptomatic microscopic hematuria: Secondary | ICD-10-CM | POA: Diagnosis not present

## 2022-02-28 DIAGNOSIS — N401 Enlarged prostate with lower urinary tract symptoms: Secondary | ICD-10-CM | POA: Diagnosis not present

## 2022-04-22 ENCOUNTER — Other Ambulatory Visit: Payer: Self-pay | Admitting: Family Medicine

## 2022-05-03 DIAGNOSIS — N401 Enlarged prostate with lower urinary tract symptoms: Secondary | ICD-10-CM | POA: Diagnosis not present

## 2022-05-03 DIAGNOSIS — R350 Frequency of micturition: Secondary | ICD-10-CM | POA: Diagnosis not present

## 2022-05-03 DIAGNOSIS — R3121 Asymptomatic microscopic hematuria: Secondary | ICD-10-CM | POA: Diagnosis not present

## 2022-05-09 DIAGNOSIS — L570 Actinic keratosis: Secondary | ICD-10-CM | POA: Diagnosis not present

## 2022-05-09 DIAGNOSIS — Z85828 Personal history of other malignant neoplasm of skin: Secondary | ICD-10-CM | POA: Diagnosis not present

## 2022-05-09 DIAGNOSIS — L918 Other hypertrophic disorders of the skin: Secondary | ICD-10-CM | POA: Diagnosis not present

## 2022-05-09 DIAGNOSIS — D225 Melanocytic nevi of trunk: Secondary | ICD-10-CM | POA: Diagnosis not present

## 2022-05-09 DIAGNOSIS — L821 Other seborrheic keratosis: Secondary | ICD-10-CM | POA: Diagnosis not present

## 2022-05-19 ENCOUNTER — Other Ambulatory Visit: Payer: Self-pay | Admitting: Family Medicine

## 2022-05-21 NOTE — Telephone Encounter (Signed)
Patient needs OV for additional refills.  Requested Prescriptions  Pending Prescriptions Disp Refills   JARDIANCE 25 MG TABS tablet [Pharmacy Med Name: JARDIANCE 25 MG TABLET] 90 tablet 0    Sig: TAKE 1 TABLET BY MOUTH EVERY DAY BEFORE BREAKFAST     Endocrinology:  Diabetes - SGLT2 Inhibitors Failed - 05/19/2022  1:14 AM      Failed - HBA1C is between 0 and 7.9 and within 180 days    Hgb A1C (fingerstick)  Date Value Ref Range Status  01/14/2013 5.8 (H) <5.7 % Final    Comment:                                                                           According to the ADA Clinical Practice Recommendations for 2011, when HbA1c is used as a screening test:     >=6.5%   Diagnostic of Diabetes Mellitus            (if abnormal result is confirmed)   5.7-6.4%   Increased risk of developing Diabetes Mellitus   References:Diagnosis and Classification of Diabetes Mellitus,Diabetes Care,2011,34(Suppl 1):S62-S69 and Standards of Medical Care in         Diabetes - 2011,Diabetes Care,2011,34 (Suppl 1):S11-S61.   PERFORMED BY [facility]   Hgb A1c MFr Bld  Date Value Ref Range Status  09/28/2021 6.4 (H) <5.7 % of total Hgb Final    Comment:    For someone without known diabetes, a hemoglobin  A1c value between 5.7% and 6.4% is consistent with prediabetes and should be confirmed with a  follow-up test. . For someone with known diabetes, a value <7% indicates that their diabetes is well controlled. A1c targets should be individualized based on duration of diabetes, age, comorbid conditions, and other considerations. . This assay result is consistent with an increased risk of diabetes. . Currently, no consensus exists regarding use of hemoglobin A1c for diagnosis of diabetes for children. .          Failed - Valid encounter within last 6 months    Recent Outpatient Visits           1 year ago Type 2 diabetes mellitus with other specified complication, without long-term current use of  insulin (HCC)   [facility] Donita Brooks, MD   2 years ago Acute hypoxemic respiratory failure due to COVID-19 [facility])   [facility] Donita Brooks, MD   3 years ago Controlled type 2 diabetes mellitus without complication, without long-term current use of insulin (HCC)   [facility] Pickard, Priscille Heidelberg, MD   4 years ago Encounter for Medicare annual wellness exam   [facility] Danelle Berry, PA-C   5 years ago Need for prophylactic vaccination against Streptococcus pneumoniae (pneumococcus)   [facility] Pickard, Priscille Heidelberg, MD              Passed - Cr in normal range and within 360 days    Creat  Date Value Ref Range Status  09/28/2021 0.83 0.70 - 1.28 mg/dL Final   Creatinine, Urine  Date Value Ref Range Status  10/12/2019 87.69 mg/dL Final    Comment:  Performed at [facility], [address], [place], [place] [address]         Passed - eGFR in normal range and within 360 days    GFR, Est African American  Date Value Ref Range Status  10/22/2019 103 > OR = 60 mL/min/1.45m2 Final   GFR, Est Non African American  Date Value Ref Range Status  10/22/2019 89 > OR = 60 mL/min/1.39m2 Final   eGFR  Date Value Ref Range Status  09/28/2021 92 > OR = 60 mL/min/1.75m2 Final

## 2022-06-18 ENCOUNTER — Other Ambulatory Visit: Payer: Self-pay | Admitting: Family Medicine

## 2022-06-28 ENCOUNTER — Telehealth: Payer: Self-pay

## 2022-06-28 ENCOUNTER — Other Ambulatory Visit: Payer: Self-pay

## 2022-06-28 DIAGNOSIS — E119 Type 2 diabetes mellitus without complications: Secondary | ICD-10-CM

## 2022-06-28 MED ORDER — ONETOUCH VERIO VI STRP: ORAL_STRIP | 6 refills | Status: DC

## 2022-06-28 NOTE — Telephone Encounter (Signed)
Prescription Request  06/28/2022  LOV: 09/28/21  What is the name of the medication or equipment? ONE TOUCH VERIO TEST STRIP 100.0 EA One Hundred  Have you contacted your pharmacy to request a refill? Yes   Which pharmacy would you like this sent to?  [id number] [place], [place] - [address] [place] [address] Phone: [phone] Fax: [phone]    Patient notified that their request is being sent to the clinical staff for review and that they should receive a response within 2 business days.   Please advise at [facility] [place] [phone]

## 2022-09-18 ENCOUNTER — Telehealth: Payer: Self-pay

## 2022-09-18 NOTE — Telephone Encounter (Signed)
Reached out to patient to set up follow up visit with provider to discuss chronic conditions.  Telephone encounter attempt : 1   A HIPAA compliant  message was left with his wife requesting a return call.  Instructed patient to call office or to call me at [phone].  Elijio Miles [facility] Health Specialist

## 2022-10-08 DIAGNOSIS — M25511 Pain in right shoulder: Secondary | ICD-10-CM | POA: Diagnosis not present

## 2022-10-25 ENCOUNTER — Telehealth: Payer: Self-pay | Admitting: Family Medicine

## 2022-10-25 DIAGNOSIS — Z961 Presence of intraocular lens: Secondary | ICD-10-CM | POA: Diagnosis not present

## 2022-10-25 DIAGNOSIS — E119 Type 2 diabetes mellitus without complications: Secondary | ICD-10-CM | POA: Diagnosis not present

## 2022-10-25 LAB — HM DIABETES EYE EXAM

## 2022-10-25 NOTE — Telephone Encounter (Signed)
Patient came to office to request samples of JARDIANCE 25 MG TABS tablet [[id number]]   Patient stated he took his last dose about 2 weeks ago.  Dispensed two boxes containing 7 tablets each.  Advised patient to contact Jardiance company directly to apply for patient assistance.  Nothing further needed at this time.

## 2022-11-08 DIAGNOSIS — L570 Actinic keratosis: Secondary | ICD-10-CM | POA: Diagnosis not present

## 2022-11-08 DIAGNOSIS — Z85828 Personal history of other malignant neoplasm of skin: Secondary | ICD-10-CM | POA: Diagnosis not present

## 2022-11-08 DIAGNOSIS — L821 Other seborrheic keratosis: Secondary | ICD-10-CM | POA: Diagnosis not present

## 2022-12-05 ENCOUNTER — Telehealth: Payer: Self-pay

## 2022-12-05 NOTE — Telephone Encounter (Signed)
Pharmacist at [facility] called in to verify pts med list needing to be refilled. Pharmacist stated that pt recently switched to their pharmacy but had no refills on most med. Pharmacist would like to know if refills of pts med could be sent to [facility]. Please advise  Please call [facility] [phone]

## 2022-12-20 ENCOUNTER — Encounter: Payer: Self-pay | Admitting: Family Medicine

## 2022-12-20 ENCOUNTER — Ambulatory Visit (INDEPENDENT_AMBULATORY_CARE_PROVIDER_SITE_OTHER): Payer: PPO | Admitting: Family Medicine

## 2022-12-20 VITALS — BP 120/68 | HR 79 | Temp 98.5°F | Ht 66.0 in | Wt 216.2 lb

## 2022-12-20 DIAGNOSIS — E1169 Type 2 diabetes mellitus with other specified complication: Secondary | ICD-10-CM | POA: Diagnosis not present

## 2022-12-20 DIAGNOSIS — Z7984 Long term (current) use of oral hypoglycemic drugs: Secondary | ICD-10-CM

## 2022-12-20 NOTE — Progress Notes (Signed)
Subjective:    Patient ID: Patrick Luna, male    DOB: 28-Oct-1945, 76 y.o.   MRN: [id number]  HPI  Patient is a very pleasant 77 year old Caucasian gentleman here today to follow-up his diabetes.  He states that he has been off his Jardiance for approximately 2 months due to the cost of the donut hole.  He has been consistently taking his lisinopril, metformin, and atorvastatin.  He denies any chest pain or shortness of breath.  He denies any neuropathy in his feet.  He politely declines a flu shot, COVID shot, and the RSV booster.  Overall he is doing well with no specific discerns. 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     aspirin EC 81 MG tablet Take 81 mg by mouth daily.     atorvastatin (LIPITOR) 20 MG tablet Take 1 tablet (20 mg total) by mouth daily. 90 tablet 3   Blood Glucose Monitoring Suppl (ONE TOUCH ULTRA 2) w/Device KIT Check Blood sugar twice a day 1 kit 1    glucose blood (ONETOUCH VERIO) test strip Use as instructed to check blood sugars up to 4 times per day. 200 each 6   JARDIANCE 25 MG TABS tablet TAKE 1 TABLET BY MOUTH EVERY DAY BEFORE BREAKFAST 90 tablet 0   Lancets (FREESTYLE) lancets Check BS BID - DX: E11.9 200 each 3   lisinopril (ZESTRIL) 10 MG tablet TAKE 1 TABLET BY MOUTH EVERY DAY 90 tablet 3   metFORMIN (GLUCOPHAGE-XR) 500 MG 24 hr tablet TAKE 4 TABLETS BY MOUTH EVERY DAY WITH BREAKFAST 360 tablet 3   Vitamin D3 (VITAMIN D) 25 MCG tablet Take 2,000 Units by mouth daily.     nitroGLYCERIN (NITROSTAT) 0.4 MG SL tablet Place 0.4 mg under the tongue every 5 (five) minutes as needed for chest pain. (Patient not taking: Reported on 12/20/2022)     No current facility-administered medications on file prior to visit.   Allergies  Allergen Reactions   Ampicillin Rash   Social History   Socioeconomic History   Marital status: Married    Spouse name: Nettie Elm   Number of children: 1   Years of education: Not on file   Highest education level: Not on file  Occupational History   Not on file  Tobacco Use   Smoking status: Former    Current packs/day: 0.00    Types: Cigarettes    Quit date: 11/27/1968    Years since quitting:  54.0   Smokeless tobacco: Never   Tobacco comments:    QUIT 1970  Vaping Use   Vaping status: Never Used  Substance and Sexual Activity   Alcohol use: No   Drug use: No   Sexual activity: Not Currently  Other Topics Concern   Not on file  Social History Narrative   1 daughter, 2 grandchildren, 1 great grandchild   Social Determinants of Health   Financial Resource Strain: Low Risk  (01/12/2022)   Overall Financial Resource Strain (CARDIA)    Difficulty of Paying Living Expenses: Not hard at all  Food Insecurity: No Food Insecurity (01/12/2022)   Hunger Vital Sign    Worried About Running Out of Food in the Last Year: Never true    Ran Out of Food in the Last Year: Never true  Transportation Needs: No  Transportation Needs (01/12/2022)   PRAPARE - Administrator, Civil Service (Medical): No    Lack of Transportation (Non-Medical): No  Physical Activity: Sufficiently Active (01/12/2022)   Exercise Vital Sign    Days of Exercise per Week: 5 days    Minutes of Exercise per Session: 30 min  Stress: No Stress Concern Present (01/12/2022)   [employer] of Occupational Health - Occupational Stress Questionnaire    Feeling of Stress : Not at all  Social Connections: Socially Integrated (01/12/2022)   Social Connection and Isolation Panel [NHANES]    Frequency of Communication with Friends and Family: More than three times a week    Frequency of Social Gatherings with Friends and Family: More than three times a week    Attends Religious Services: More than 4 times per year    Active Member of [employer] or Organizations: Yes    Attends Banker Meetings: More than 4 times per year    Marital Status: Married  Catering manager Violence: Not At Risk (01/12/2022)   Humiliation, Afraid, Rape, and Kick questionnaire    Fear of Current or Ex-Partner: No    Emotionally Abused: No    Physically Abused: No    Sexually Abused: No      Review of Systems  All other systems reviewed and are negative.      Objective:   Physical Exam Vitals reviewed.  Constitutional:      Appearance: He is well-developed.  Neck:     Thyroid: No thyromegaly.     Vascular: No JVD.  Cardiovascular:     Rate and Rhythm: Normal rate and regular rhythm.     Heart sounds: Normal heart sounds. No murmur heard. Pulmonary:     Effort: Pulmonary effort is normal. No respiratory distress.     Breath sounds: Normal breath sounds. No wheezing or rales.  Abdominal:     General: Bowel sounds are normal. There is no distension.     Palpations: Abdomen is soft.     Tenderness: There is no abdominal tenderness. There is no guarding or rebound.           Assessment & Plan:  Type 2 diabetes  mellitus with other specified complication, without long-term current use of insulin (HCC) - Plan: CBC with Differential/Platelet, COMPLETE METABOLIC PANEL WITH GFR, Lipid panel, Hemoglobin A1c, Microalbumin/Creatinine Ratio, Urine I am very happy with his blood pressure.  He reports that his sugars are typically between 120 and 140.  He denies any hypoglycemic episodes or significant hyperglycemia.  If his A1c is greater than 7 we may try to add back the Jardiance.  Recommended a flu shot and a COVID shot.  Check CMP, lipid panel, A1c, and urine for creatinine ratio

## 2022-12-21 ENCOUNTER — Other Ambulatory Visit: Payer: Self-pay

## 2022-12-21 DIAGNOSIS — E1169 Type 2 diabetes mellitus with other specified complication: Secondary | ICD-10-CM

## 2022-12-21 LAB — MICROALBUMIN / CREATININE URINE RATIO
Creatinine, Urine: 122 mg/dL (ref 20–320)
Microalb Creat Ratio: 2 mg/g{creat} (ref <30)
Microalb, Ur: 0.2 mg/dL

## 2022-12-21 LAB — COMPLETE METABOLIC PANEL WITH GFR
AG Ratio: 1.7 (calc) (ref 1.0–2.5)
ALT: 33 U/L (ref 9–46)
AST: 27 U/L (ref 10–35)
Albumin: 4.2 g/dL (ref 3.6–5.1)
Alkaline phosphatase (APISO): 63 U/L (ref 35–144)
BUN: 15 mg/dL (ref 7–25)
CO2: 29 mmol/L (ref 20–32)
Calcium: 9.3 mg/dL (ref 8.6–10.3)
Chloride: 103 mmol/L (ref 98–110)
Creat: 0.87 mg/dL (ref 0.70–1.28)
Globulin: 2.5 g/dL (ref 1.9–3.7)
Glucose, Bld: 131 mg/dL — ABNORMAL HIGH (ref 65–99)
Potassium: 4.2 mmol/L (ref 3.5–5.3)
Sodium: 141 mmol/L (ref 135–146)
Total Bilirubin: 1 mg/dL (ref 0.2–1.2)
Total Protein: 6.7 g/dL (ref 6.1–8.1)
eGFR: 90 mL/min/{1.73_m2} (ref >=60)

## 2022-12-21 LAB — CBC WITH DIFFERENTIAL/PLATELET
Absolute Lymphocytes: 2130 {cells}/uL (ref 850–3900)
Absolute Monocytes: 800 {cells}/uL (ref 200–950)
Basophils Absolute: 47 {cells}/uL (ref 0–200)
Basophils Relative: 0.5 %
Eosinophils Absolute: 233 {cells}/uL (ref 15–500)
Eosinophils Relative: 2.5 %
HCT: 44.3 % (ref 38.5–50.0)
Hemoglobin: 15.7 g/dL (ref 13.2–17.1)
MCH: 33.2 pg — ABNORMAL HIGH (ref 27.0–33.0)
MCHC: 35.4 g/dL (ref 32.0–36.0)
MCV: 93.7 fL (ref 80.0–100.0)
MPV: 11 fL (ref 7.5–12.5)
Monocytes Relative: 8.6 %
Neutro Abs: 6092 {cells}/uL (ref 1500–7800)
Neutrophils Relative %: 65.5 %
Platelets: 252 10*3/uL (ref 140–400)
RBC: 4.73 10*6/uL (ref 4.20–5.80)
RDW: 12.8 % (ref 11.0–15.0)
Total Lymphocyte: 22.9 %
WBC: 9.3 10*3/uL (ref 3.8–10.8)

## 2022-12-21 LAB — LIPID PANEL
Cholesterol: 98 mg/dL (ref <200)
HDL: 39 mg/dL — ABNORMAL LOW (ref >=40)
LDL Cholesterol (Calc): 39 mg/dL
Non-HDL Cholesterol (Calc): 59 mg/dL (ref <130)
Total CHOL/HDL Ratio: 2.5 (calc) (ref <5.0)
Triglycerides: 123 mg/dL (ref <150)

## 2022-12-21 LAB — HEMOGLOBIN A1C
Hgb A1c MFr Bld: 7.7 %{Hb} — ABNORMAL HIGH (ref <5.7)
Mean Plasma Glucose: 174 mg/dL
eAG (mmol/L): 9.7 mmol/L

## 2022-12-21 MED ORDER — EMPAGLIFLOZIN 25 MG PO TABS: 25.0000 mg | ORAL_TABLET | Freq: Every day | ORAL | 0 refills | Status: DC

## 2023-01-07 ENCOUNTER — Other Ambulatory Visit: Payer: Self-pay

## 2023-01-07 ENCOUNTER — Telehealth: Payer: Self-pay | Admitting: Family Medicine

## 2023-01-07 DIAGNOSIS — E1169 Type 2 diabetes mellitus with other specified complication: Secondary | ICD-10-CM

## 2023-01-07 MED ORDER — EMPAGLIFLOZIN 25 MG PO TABS: 25.0000 mg | ORAL_TABLET | Freq: Every day | ORAL | 1 refills | Status: DC

## 2023-01-07 NOTE — Telephone Encounter (Signed)
Patient came to office to advise provider the pharmacy needs a new script for empagliflozin (JARDIANCE) 25 MG TABS tablet   Pharmacy confirmed as:  [facility] - [place], [place] - [address] [place], [place] [address] Phone: [phone]  Fax: [phone]   Please advise patient at [phone].

## 2023-01-15 ENCOUNTER — Other Ambulatory Visit: Payer: Self-pay

## 2023-01-15 ENCOUNTER — Telehealth: Payer: Self-pay | Admitting: Family Medicine

## 2023-01-15 DIAGNOSIS — E1169 Type 2 diabetes mellitus with other specified complication: Secondary | ICD-10-CM

## 2023-01-15 MED ORDER — ATORVASTATIN CALCIUM 20 MG PO TABS: 20.0000 mg | ORAL_TABLET | Freq: Every day | ORAL | 3 refills | Status: DC

## 2023-01-15 MED ORDER — METFORMIN HCL ER 500 MG PO TB24: ORAL_TABLET | ORAL | 0 refills | Status: DC

## 2023-01-15 NOTE — Telephone Encounter (Signed)
Prescription Request  01/15/2023  LOV: 12/20/2022  What is the name of the medication or equipment? atorvastatin (LIPITOR) 20 MG tablet   Have you contacted your pharmacy to request a refill? Yes   Which pharmacy would you like this sent to?  [id number] [place], [place] - [address] [place] [address] Phone: [phone] Fax: [phone]    Patient notified that their request is being sent to the clinical staff for review and that they should receive a response within 2 business days.   Please advise at [facility] [phone]

## 2023-01-15 NOTE — Progress Notes (Signed)
Courtesy refill. Pt needs cpe for future refills.

## 2023-01-25 ENCOUNTER — Other Ambulatory Visit: Payer: Self-pay

## 2023-01-25 NOTE — Telephone Encounter (Signed)
Rx 04/23/22 #90 3RF-1 year supply Requested Prescriptions  Pending Prescriptions Disp Refills   lisinopril (ZESTRIL) 10 MG tablet 90 tablet 3    Sig: Take 1 tablet (10 mg total) by mouth daily.     Cardiovascular:  ACE Inhibitors Failed - 01/25/2023 12:05 PM      Failed - Valid encounter within last 6 months    Recent Outpatient Visits           1 year ago Type 2 diabetes mellitus with other specified complication, without long-term current use of insulin (HCC)   [facility] Pickard, Priscille Heidelberg, MD   3 years ago Acute hypoxemic respiratory failure due to COVID-19 [facility])   [facility] Donita Brooks, MD   3 years ago Controlled type 2 diabetes mellitus without complication, without long-term current use of insulin (HCC)   [facility] Donita Brooks, MD   5 years ago Encounter for Medicare annual wellness exam   [facility] Danelle Berry, PA-C   6 years ago Need for prophylactic vaccination against Streptococcus pneumoniae (pneumococcus)   [facility] Pickard, Priscille Heidelberg, MD              Passed - Cr in normal range and within 180 days    Creat  Date Value Ref Range Status  12/20/2022 0.87 0.70 - 1.28 mg/dL Final   Creatinine, Urine  Date Value Ref Range Status  12/20/2022 122 20 - 320 mg/dL Final         Passed - K in normal range and within 180 days    Potassium  Date Value Ref Range Status  12/20/2022 4.2 3.5 - 5.3 mmol/L Final         Passed - Patient is not pregnant      Passed - Last BP in normal range    BP Readings from Last 1 Encounters:  12/20/22 120/68

## 2023-01-25 NOTE — Telephone Encounter (Signed)
Prescription Request  01/25/2023  LOV: 12/20/22  What is the name of the medication or equipment? lisinopril (ZESTRIL) 10 MG tablet [[id number]]   Have you contacted your pharmacy to request a refill? Yes   Which pharmacy would you like this sent to?  [id number] [place], [place] - [address] [place] [address] Phone: [phone] Fax: [phone]    Patient notified that their request is being sent to the clinical staff for review and that they should receive a response within 2 business days.   Please advise at [facility] [phone]

## 2023-02-01 ENCOUNTER — Encounter: Payer: Self-pay | Admitting: Family Medicine

## 2023-02-01 ENCOUNTER — Ambulatory Visit: Payer: PPO | Admitting: Family Medicine

## 2023-02-01 VITALS — BP 120/80 | HR 89 | Temp 98.8°F | Ht 66.0 in | Wt 218.4 lb

## 2023-02-01 DIAGNOSIS — J40 Bronchitis, not specified as acute or chronic: Secondary | ICD-10-CM | POA: Diagnosis not present

## 2023-02-01 MED ORDER — AZITHROMYCIN 250 MG PO TABS: ORAL_TABLET | ORAL | 0 refills | Status: DC

## 2023-02-01 MED ORDER — LISINOPRIL 10 MG PO TABS: 10.0000 mg | ORAL_TABLET | Freq: Every day | ORAL | 3 refills | Status: DC

## 2023-02-01 NOTE — Progress Notes (Signed)
Subjective:    Patient ID: Patrick Luna, male    DOB: 1945/04/29, 77 y.o.   MRN: [id number]  Wheezing     Patient reports a 1 week history of wheezing and cough.  He reports congestion in his lungs.  He reports some shortness of breath and sinus pain.  Been taking Mucinex and Tylenol with any benefit and today I am, the patient has congestion in his left lower lung with crackles and rales.  He also has few scattered expiratory wheezes. 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     aspirin EC 81 MG tablet Take 81 mg by mouth daily.     atorvastatin (LIPITOR) 20 MG tablet Take 1 tablet (20 mg total) by mouth daily. 90 tablet 3   Blood Glucose Monitoring Suppl (ONE TOUCH ULTRA 2) w/Device KIT Check Blood sugar twice a day 1 kit 1   empagliflozin (JARDIANCE) 25 MG TABS tablet Take 1 tablet (25 mg total) by mouth daily. 90 tablet 1   glucose blood (ONETOUCH VERIO) test strip Use  as instructed to check blood sugars up to 4 times per day. 200 each 6   Lancets (FREESTYLE) lancets Check BS BID - DX: E11.9 200 each 3   metFORMIN (GLUCOPHAGE-XR) 500 MG 24 hr tablet TAKE 4 TABLETS BY MOUTH EVERY DAY WITH BREAKFAST 120 tablet 0   Vitamin D3 (VITAMIN D) 25 MCG tablet Take 2,000 Units by mouth daily.     nitroGLYCERIN (NITROSTAT) 0.4 MG SL tablet Place 0.4 mg under the tongue every 5 (five) minutes as needed for chest pain. (Patient not taking: Reported on 12/20/2022)     No current facility-administered medications on file prior to visit.   Allergies  Allergen Reactions   Ampicillin Rash   Social History   Socioeconomic History   Marital status: Married    Spouse name: Nettie Elm   Number of children: 1   Years of education: Not on file   Highest education level: Not on file  Occupational History   Not on file  Tobacco Use   Smoking status: Former    Current packs/day: 0.00    Types: Cigarettes    Quit date: 11/27/1968    Years since quitting: 54.2   Smokeless tobacco: Never   Tobacco comments:    QUIT 1970  Vaping Use   Vaping status: Never Used  Substance and Sexual Activity   Alcohol use: No   Drug use: No  Sexual activity: Not Currently  Other Topics Concern   Not on file  Social History Narrative   1 daughter, 2 grandchildren, 1 great grandchild   Social Drivers of Corporate investment banker Strain: Low Risk  (01/12/2022)   Overall Financial Resource Strain (CARDIA)    Difficulty of Paying Living Expenses: Not hard at all  Food Insecurity: No Food Insecurity (01/12/2022)   Hunger Vital Sign    Worried About Running Out of Food in the Last Year: Never true    Ran Out of Food in the Last Year: Never true  Transportation Needs: No Transportation Needs (01/12/2022)   PRAPARE - Administrator, Civil Service (Medical): No    Lack of Transportation (Non-Medical): No  Physical Activity: Sufficiently Active (01/12/2022)   Exercise Vital Sign     Days of Exercise per Week: 5 days    Minutes of Exercise per Session: 30 min  Stress: No Stress Concern Present (01/12/2022)   [employer] of Occupational Health - Occupational Stress Questionnaire    Feeling of Stress : Not at all  Social Connections: Socially Integrated (01/12/2022)   Social Connection and Isolation Panel [NHANES]    Frequency of Communication with Friends and Family: More than three times a week    Frequency of Social Gatherings with Friends and Family: More than three times a week    Attends Religious Services: More than 4 times per year    Active Member of [employer] or Organizations: Yes    Attends Banker Meetings: More than 4 times per year    Marital Status: Married  Catering manager Violence: Not At Risk (01/12/2022)   Humiliation, Afraid, Rape, and Kick questionnaire    Fear of Current or Ex-Partner: No    Emotionally Abused: No    Physically Abused: No    Sexually Abused: No      Review of Systems  Respiratory:  Positive for wheezing.   All other systems reviewed and are negative.      Objective:   Physical Exam Vitals reviewed.  Constitutional:      Appearance: He is well-developed.  HENT:     Right Ear: Tympanic membrane and ear canal normal.     Left Ear: Tympanic membrane and ear canal normal.     Nose: Congestion and rhinorrhea present.     Mouth/Throat:     Pharynx: No oropharyngeal exudate or posterior oropharyngeal erythema.  Neck:     Thyroid: No thyromegaly.     Vascular: No JVD.  Cardiovascular:     Rate and Rhythm: Normal rate and regular rhythm.     Heart sounds: Normal heart sounds. No murmur heard. Pulmonary:     Effort: Pulmonary effort is normal. No respiratory distress.     Breath sounds: Wheezing and rales present.  Abdominal:     General: Bowel sounds are normal. There is no distension.     Palpations: Abdomen is soft.     Tenderness: There is no abdominal tenderness. There is no guarding or rebound.            Assessment & Plan:  Bronchitis Start Z-Pak for bronchitis.  Use Coricidin for congestion.  Use Mucinex for congestion.  Reassess in 1 week if no better or sooner if worse.

## 2023-02-20 ENCOUNTER — Telehealth: Payer: Self-pay | Admitting: Family Medicine

## 2023-02-20 NOTE — Telephone Encounter (Signed)
 Patient came to the office to permanently change his pharmacy. New pharmacy is:  [facility]. [address] [place], [place] [address] [phone].  Please update patient's chart accordingly.   Thank you.

## 2023-02-26 DIAGNOSIS — L821 Other seborrheic keratosis: Secondary | ICD-10-CM | POA: Diagnosis not present

## 2023-02-26 DIAGNOSIS — L570 Actinic keratosis: Secondary | ICD-10-CM | POA: Diagnosis not present

## 2023-02-26 DIAGNOSIS — Z85828 Personal history of other malignant neoplasm of skin: Secondary | ICD-10-CM | POA: Diagnosis not present

## 2023-03-08 ENCOUNTER — Encounter: Payer: Self-pay | Admitting: Family Medicine

## 2023-04-12 ENCOUNTER — Ambulatory Visit: Payer: PPO | Admitting: Family Medicine

## 2023-04-12 ENCOUNTER — Ambulatory Visit: Payer: Self-pay | Admitting: Family Medicine

## 2023-04-12 ENCOUNTER — Encounter: Payer: Self-pay | Admitting: Family Medicine

## 2023-04-12 VITALS — BP 122/70 | HR 100 | Temp 99.7°F | Ht 66.0 in | Wt 213.4 lb

## 2023-04-12 DIAGNOSIS — J069 Acute upper respiratory infection, unspecified: Secondary | ICD-10-CM

## 2023-04-12 LAB — INFLUENZA A AND B AG, IMMUNOASSAY
INFLUENZA A ANTIGEN: NOT DETECTED
INFLUENZA B ANTIGEN: NOT DETECTED

## 2023-04-12 MED ORDER — AZITHROMYCIN 250 MG PO TABS
ORAL_TABLET | ORAL | 0 refills | Status: DC
Start: 2023-04-12 — End: 2023-04-16

## 2023-04-12 NOTE — Progress Notes (Signed)
 Subjective:    Patient ID: Patrick Luna, male    DOB: 1945-09-09, 78 y.o.   MRN: [id number] Patient has been sick for less than 48 hours.  He reports fever to 100.  He reports sinus pressure and sinus congestion.  He reports cough.  He reports feeling weak and tired.  He denies any chest pain or shortness of breath.  He denies any otalgia.  He denies any severe sore throat.  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     aspirin EC 81 MG tablet Take 81 mg by mouth daily.     atorvastatin (LIPITOR) 20 MG tablet Take 1 tablet (20 mg total) by mouth daily. 90 tablet 3   azithromycin (ZITHROMAX) 250 MG tablet 2 tabs poqday1, 1 tab poqday 2-5 6 tablet 0   Blood Glucose Monitoring Suppl (ONE TOUCH ULTRA 2) w/Device KIT Check Blood sugar twice a day 1 kit 1   empagliflozin (JARDIANCE) 25 MG TABS tablet Take 1 tablet (25 mg total) by mouth daily. 90 tablet 1    glucose blood (ONETOUCH VERIO) test strip Use as instructed to check blood sugars up to 4 times per day. 200 each 6   Lancets (FREESTYLE) lancets Check BS BID - DX: E11.9 200 each 3   lisinopril (ZESTRIL) 10 MG tablet Take 1 tablet (10 mg total) by mouth daily. 90 tablet 3   metFORMIN (GLUCOPHAGE-XR) 500 MG 24 hr tablet TAKE 4 TABLETS BY MOUTH EVERY DAY WITH BREAKFAST 120 tablet 0   nitroGLYCERIN (NITROSTAT) 0.4 MG SL tablet Place 0.4 mg under the tongue every 5 (five) minutes as needed for chest pain. (Patient not taking: Reported on 12/20/2022)     Vitamin D3 (VITAMIN D) 25 MCG tablet Take 2,000 Units by mouth daily.     No current facility-administered medications on file prior to visit.   Allergies  Allergen Reactions   Ampicillin Rash   Social History   Socioeconomic History   Marital status: Married    Spouse name: Nettie Elm   Number of children: 1   Years of education: Not on file   Highest education level: Not on file  Occupational History   Not on file  Tobacco Use   Smoking status: Former    Current packs/day: 0.00    Types: Cigarettes    Quit date: 11/27/1968    Years since quitting: 54.4   Smokeless tobacco: Never  Tobacco comments:    QUIT 1970  Vaping Use   Vaping status: Never Used  Substance and Sexual Activity   Alcohol use: No   Drug use: No   Sexual activity: Not Currently  Other Topics Concern   Not on file  Social History Narrative   1 daughter, 2 grandchildren, 1 great grandchild   Social Drivers of Corporate investment banker Strain: Low Risk  (01/12/2022)   Overall Financial Resource Strain (CARDIA)    Difficulty of Paying Living Expenses: Not hard at all  Food Insecurity: No Food Insecurity (01/12/2022)   Hunger Vital Sign    Worried About Running Out of Food in the Last Year: Never true    Ran Out of Food in the Last Year: Never true  Transportation Needs: No Transportation Needs (01/12/2022)   PRAPARE - Scientist, research (physical sciences) (Medical): No    Lack of Transportation (Non-Medical): No  Physical Activity: Sufficiently Active (01/12/2022)   Exercise Vital Sign    Days of Exercise per Week: 5 days    Minutes of Exercise per Session: 30 min  Stress: No Stress Concern Present (01/12/2022)   [employer] of Occupational Health - Occupational Stress Questionnaire    Feeling of Stress : Not at all  Social Connections: Socially Integrated (01/12/2022)   Social Connection and Isolation Panel [NHANES]    Frequency of Communication with Friends and Family: More than three times a week    Frequency of Social Gatherings with Friends and Family: More than three times a week    Attends Religious Services: More than 4 times per year    Active Member of [employer] or Organizations: Yes    Attends Banker Meetings: More than 4 times per year    Marital Status: Married  Catering manager Violence: Not At Risk (01/12/2022)   Humiliation, Afraid, Rape, and Kick questionnaire    Fear of Current or Ex-Partner: No    Emotionally Abused: No    Physically Abused: No    Sexually Abused: No      Review of Systems  Respiratory:  Positive for wheezing.   All other systems reviewed and are negative.      Objective:   Physical Exam Vitals reviewed.  Constitutional:      Appearance: Normal appearance. He is well-developed. He is not ill-appearing or toxic-appearing.  HENT:     Right Ear: Tympanic membrane and ear canal normal.     Left Ear: Tympanic membrane and ear canal normal.     Nose: Congestion and rhinorrhea present.     Right Sinus: No maxillary sinus tenderness or frontal sinus tenderness.     Left Sinus: No maxillary sinus tenderness or frontal sinus tenderness.     Mouth/Throat:     Pharynx: No oropharyngeal exudate or posterior oropharyngeal erythema.  Neck:     Thyroid: No thyromegaly.     Vascular: No JVD.  Cardiovascular:     Rate and Rhythm: Normal rate and regular rhythm.     Heart  sounds: Normal heart sounds. No murmur heard. Pulmonary:     Effort: Pulmonary effort is normal. No respiratory distress.     Breath sounds: No wheezing or rales.  Abdominal:     General: Bowel sounds are normal. There is no distension.     Palpations: Abdomen is soft.     Tenderness: There is no abdominal tenderness. There is no guarding or rebound.  Lymphadenopathy:     Cervical: No  cervical adenopathy.  Neurological:     Mental Status: He is alert.           Assessment & Plan:  Upper respiratory tract infection, unspecified type - Plan: Influenza A and B Ag, Immunoassay, Influenza A and B Ag, Immunoassay Flu today is negative.  I believe the patient has a viral upper respiratory infection.  Recommended supportive care with Tylenol for body aches and fever, Coricidin for head congestion, Mucinex for cough.  Anticipate 5-6 more days of symptoms.  If the patient develops severe pain in his frontal or maxillary sinuses he can take a Z-Pak for secondary sinusitis.  However I explained to the patient that I believe his initial infection with viral and asking to give at least 5 or 6 more days prior to taking the antibiotic

## 2023-04-12 NOTE — Telephone Encounter (Signed)
  Chief Complaint: Nasal Congestion Symptoms: Fatigue Frequency: Since Wednesday Pertinent Negatives: Patient denies dyspnea, chest pain, or fever Disposition: [] ED /[] Urgent Care (no appt availability in office) / [x] Appointment(In office/virtual)/ []  [facility]/ [] Home Care/ [] Refused Recommended Disposition /[] [facility]/ []  Follow-up with PCP Additional Notes: SA is being triaged for nasal and sinus congestion that is acute in onset and worsening. The patient has a history of these types of infections. Mucinex last night, but the patient is worse this morning.The patient reports mucopurulent drainage from the nose. The patient spiked a low grade fever yesterday but does not have one today. In office appointment made for today.   Reason for Disposition  [1] Sinus pain (not just congestion) AND [2] fever  Answer Assessment - Initial Assessment Questions 1. LOCATION: "Where does it hurt?"      Head, Around the Front and Nose  2. ONSET: "When did the sinus pain start?"  (e.g., hours, days)      Last Night  3. SEVERITY: "How bad is the pain?"   (Scale 1-10; mild, moderate or severe)   - MILD (1-3): doesn't interfere with normal activities    - MODERATE (4-7): interferes with normal activities (e.g., work or school) or awakens from sleep   - SEVERE (8-10): excruciating pain and patient unable to do any normal activities        8  4. RECURRENT SYMPTOM: "Have you ever had sinus problems before?" If Yes, ask: "When was the last time?" and "What happened that time?"        5. NASAL CONGESTION: "Is the nose blocked?" If Yes, ask: "Can you open it or must you breathe through your mouth?"     Yes  6. NASAL DISCHARGE: "Do you have discharge from your nose?" If so ask, "What color?"     Yes, yellow  7. FEVER: "Do you have a fever?" If Yes, ask: "What is it, how was it measured, and when did it start?"      No, low grade, yesterday 99.5  8. OTHER SYMPTOMS: "Do you  have any other symptoms?" (e.g., sore throat, cough, earache, difficulty breathing)     Cannot lay flat  Protocols used: Sinus Pain or Congestion-A-AH

## 2023-04-16 ENCOUNTER — Other Ambulatory Visit: Payer: Self-pay

## 2023-04-16 ENCOUNTER — Telehealth: Payer: Self-pay | Admitting: Family Medicine

## 2023-04-16 DIAGNOSIS — J069 Acute upper respiratory infection, unspecified: Secondary | ICD-10-CM

## 2023-04-16 DIAGNOSIS — J40 Bronchitis, not specified as acute or chronic: Secondary | ICD-10-CM

## 2023-04-16 MED ORDER — AZITHROMYCIN 250 MG PO TABS: ORAL_TABLET | ORAL | 0 refills | Status: DC

## 2023-04-16 NOTE — Telephone Encounter (Signed)
 Copied from CRM [id number]. Topic: Clinical - Prescription Issue >> Apr 16, 2023 12:38 PM Shon Hale wrote: Reason for CRM: Pt had visit last Friday w/ Dr. Tanya Nones. Patient requested medication to be sent to [facility].  azithromycin (ZITHROMAX) 250 MG tablet[facility] Patient states he no longer is using that pharmacy and requesting prescription to be sent to [facility], [place] - [address] [place] [address] Phone: [phone] Fax: [phone] Hours: Not open 24 hours

## 2023-04-19 ENCOUNTER — Ambulatory Visit: Payer: PPO | Admitting: Family Medicine

## 2023-04-20 ENCOUNTER — Other Ambulatory Visit: Payer: Self-pay | Admitting: Family Medicine

## 2023-04-22 NOTE — Telephone Encounter (Signed)
 Requested Prescriptions  Pending Prescriptions Disp Refills   metFORMIN (GLUCOPHAGE-XR) 500 MG 24 hr tablet [Pharmacy Med Name: METFORMIN HCL ER 500 MG TABLET] 120 tablet 0    Sig: TAKE 4 TABLETS BY MOUTH EVERY DAY WITH BREAKFAST     Endocrinology:  Diabetes - Biguanides Failed - 04/22/2023 12:32 PM      Failed - B12 Level in normal range and within 720 days    No results found for: "VITAMINB12"       Failed - Valid encounter within last 6 months    Recent Outpatient Visits           2 years ago Type 2 diabetes mellitus with other specified complication, without long-term current use of insulin (HCC)   [facility] Pickard, Priscille Heidelberg, MD   3 years ago Acute hypoxemic respiratory failure due to COVID-19 [facility])   [facility] Donita Brooks, MD   4 years ago Controlled type 2 diabetes mellitus without complication, without long-term current use of insulin (HCC)   [facility] Pickard, Priscille Heidelberg, MD   5 years ago Encounter for Medicare annual wellness exam   [facility] Danelle Berry, PA-C   6 years ago Need for prophylactic vaccination against Streptococcus pneumoniae (pneumococcus)   [facility] Pickard, Priscille Heidelberg, MD              Passed - Cr in normal range and within 360 days    Creat  Date Value Ref Range Status  12/20/2022 0.87 0.70 - 1.28 mg/dL Final   Creatinine, Urine  Date Value Ref Range Status  12/20/2022 122 20 - 320 mg/dL Final         Passed - HBA1C is between 0 and 7.9 and within 180 days    Hgb A1C (fingerstick)  Date Value Ref Range Status  01/14/2013 5.8 (H) <5.7 % Final    Comment:                                                                           According to the ADA Clinical Practice Recommendations for 2011, when HbA1c is used as a screening test:     >=6.5%   Diagnostic of Diabetes Mellitus            (if abnormal result is confirmed)   5.7-6.4%   Increased  risk of developing Diabetes Mellitus   References:Diagnosis and Classification of Diabetes Mellitus,Diabetes Care,2011,34(Suppl 1):S62-S69 and Standards of Medical Care in         Diabetes - 2011,Diabetes Care,2011,34 (Suppl 1):S11-S61.   PERFORMED BY JWRAY   Hgb A1c MFr Bld  Date Value Ref Range Status  12/20/2022 7.7 (H) <5.7 % of total Hgb Final    Comment:    For someone without known diabetes, a hemoglobin A1c value of 6.5% or greater indicates that they may have  diabetes and this should be confirmed with a follow-up  test. . For someone with known diabetes, a value <7% indicates  that their diabetes is well controlled and a value  greater than or equal to 7% indicates suboptimal  control. A1c targets should be individualized based on  duration of diabetes,  age, comorbid conditions, and  other considerations. . Currently, no consensus exists regarding use of hemoglobin A1c for diagnosis of diabetes for children. .          Passed - eGFR in normal range and within 360 days    GFR, Est African American  Date Value Ref Range Status  10/22/2019 103 > OR = 60 mL/min/1.54m2 Final   GFR, Est Non African American  Date Value Ref Range Status  10/22/2019 89 > OR = 60 mL/min/1.31m2 Final   eGFR  Date Value Ref Range Status  12/20/2022 90 > OR = 60 mL/min/1.43m2 Final         Passed - CBC within normal limits and completed in the last 12 months    WBC  Date Value Ref Range Status  12/20/2022 9.3 3.8 - 10.8 Thousand/uL Final   RBC  Date Value Ref Range Status  12/20/2022 4.73 4.20 - 5.80 Million/uL Final   Hemoglobin  Date Value Ref Range Status  12/20/2022 15.7 13.2 - 17.1 g/dL Final   HCT  Date Value Ref Range Status  12/20/2022 44.3 38.5 - 50.0 % Final   MCHC  Date Value Ref Range Status  12/20/2022 35.4 32.0 - 36.0 g/dL Final    Comment:    For adults, a slight decrease in the calculated MCHC value (in the range of 30 to 32 g/dL) is most likely not  clinically significant; however, it should be interpreted with caution in correlation with other red cell parameters and the patient's clinical condition.    [facility]  Date Value Ref Range Status  12/20/2022 33.2 (H) 27.0 - 33.0 pg Final   MCV  Date Value Ref Range Status  12/20/2022 93.7 80.0 - 100.0 fL Final   No results found for: "PLTCOUNTKUC", "LABPLAT", "POCPLA" RDW  Date Value Ref Range Status  12/20/2022 12.8 11.0 - 15.0 % Final

## 2023-05-02 DIAGNOSIS — R350 Frequency of micturition: Secondary | ICD-10-CM | POA: Diagnosis not present

## 2023-05-02 DIAGNOSIS — N5201 Erectile dysfunction due to arterial insufficiency: Secondary | ICD-10-CM | POA: Diagnosis not present

## 2023-05-02 DIAGNOSIS — N401 Enlarged prostate with lower urinary tract symptoms: Secondary | ICD-10-CM | POA: Diagnosis not present

## 2023-05-02 DIAGNOSIS — N281 Cyst of kidney, acquired: Secondary | ICD-10-CM | POA: Diagnosis not present

## 2023-05-03 ENCOUNTER — Other Ambulatory Visit: Payer: Self-pay

## 2023-05-03 ENCOUNTER — Telehealth: Payer: Self-pay | Admitting: Family Medicine

## 2023-05-03 DIAGNOSIS — E119 Type 2 diabetes mellitus without complications: Secondary | ICD-10-CM

## 2023-05-03 MED ORDER — METFORMIN HCL ER 500 MG PO TB24: ORAL_TABLET | ORAL | 1 refills | Status: DC

## 2023-05-03 NOTE — Telephone Encounter (Signed)
 Refill for metFORMIN (GLUCOPHAGE-XR) 500 MG 24 hr tablet [facility] [address]  Pharmacist is requesting a 90 day script.   Pharmacy:     Please advise pharmacist.

## 2023-05-08 DIAGNOSIS — D0422 Carcinoma in situ of skin of left ear and external auricular canal: Secondary | ICD-10-CM | POA: Diagnosis not present

## 2023-05-08 DIAGNOSIS — D485 Neoplasm of uncertain behavior of skin: Secondary | ICD-10-CM | POA: Diagnosis not present

## 2023-05-08 DIAGNOSIS — L570 Actinic keratosis: Secondary | ICD-10-CM | POA: Diagnosis not present

## 2023-05-08 DIAGNOSIS — D1801 Hemangioma of skin and subcutaneous tissue: Secondary | ICD-10-CM | POA: Diagnosis not present

## 2023-05-08 DIAGNOSIS — L821 Other seborrheic keratosis: Secondary | ICD-10-CM | POA: Diagnosis not present

## 2023-05-08 DIAGNOSIS — Z85828 Personal history of other malignant neoplasm of skin: Secondary | ICD-10-CM | POA: Diagnosis not present

## 2023-05-13 ENCOUNTER — Encounter: Payer: Self-pay | Admitting: Family Medicine

## 2023-05-13 ENCOUNTER — Ambulatory Visit (INDEPENDENT_AMBULATORY_CARE_PROVIDER_SITE_OTHER): Payer: PPO | Admitting: Family Medicine

## 2023-05-13 VITALS — BP 132/78 | HR 95 | Temp 97.6°F | Ht 66.0 in | Wt 210.4 lb

## 2023-05-13 DIAGNOSIS — E1169 Type 2 diabetes mellitus with other specified complication: Secondary | ICD-10-CM | POA: Diagnosis not present

## 2023-05-13 DIAGNOSIS — I10 Essential (primary) hypertension: Secondary | ICD-10-CM

## 2023-05-13 DIAGNOSIS — Z7984 Long term (current) use of oral hypoglycemic drugs: Secondary | ICD-10-CM | POA: Diagnosis not present

## 2023-05-13 NOTE — Progress Notes (Signed)
 Subjective:    Patient ID: Patrick Luna, male    DOB: [birth year], 78 y.o.   MRN: [id number]  HPI  Patient is a very pleasant 78 year old Caucasian gentleman here today to follow-up his diabetes.  Patient has been consistently taking his metformin and his Jardiance.  He denies any side effects from the medication.  Pacifically denies any dysuria or urgency or flexing.  He denies any chest pain or orthopnea.  He denies any neuropathy in his feet or polyuria or polydipsia. 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     aspirin EC 81 MG tablet Take 81 mg by mouth daily.     atorvastatin (LIPITOR) 20 MG tablet Take 1 tablet (20 mg total) by mouth daily. 90 tablet 3   azithromycin (ZITHROMAX) 250 MG tablet 2 tabs poqday1, 1 tab poqday 2-5 6 tablet 0   azithromycin (ZITHROMAX) 250 MG tablet 2 tabs poqday1, 1 tab poqday 2-5 6 tablet 0   Blood Glucose Monitoring Suppl (ONE TOUCH ULTRA  2) w/Device KIT Check Blood sugar twice a day 1 kit 1   empagliflozin (JARDIANCE) 25 MG TABS tablet Take 1 tablet (25 mg total) by mouth daily. 90 tablet 1   glucose blood (ONETOUCH VERIO) test strip Use as instructed to check blood sugars up to 4 times per day. 200 each 6   Lancets (FREESTYLE) lancets Check BS BID - DX: E11.9 200 each 3   lisinopril (ZESTRIL) 10 MG tablet Take 1 tablet (10 mg total) by mouth daily. 90 tablet 3   metFORMIN (GLUCOPHAGE-XR) 500 MG 24 hr tablet TAKE 4 TABLETS BY MOUTH EVERY DAY WITH BREAKFAST 360 tablet 1   nitroGLYCERIN (NITROSTAT) 0.4 MG SL tablet Place 0.4 mg under the tongue every 5 (five) minutes as needed for chest pain.     Vitamin D3 (VITAMIN D) 25 MCG tablet Take 2,000 Units by mouth daily.     No current facility-administered medications on file prior to visit.   Allergies  Allergen Reactions   Ampicillin Rash   Social History   Socioeconomic History   Marital status: Married    Spouse name: Nettie Elm   Number of children: 1   Years of education: Not on file   Highest education level: Not on file  Occupational History   Not on file  Tobacco Use   Smoking status: Former    Current packs/day: 0.00    Types: Cigarettes    Quit date: 11/27/1968  Years since quitting: 54.4   Smokeless tobacco: Never   Tobacco comments:    QUIT 1970  Vaping Use   Vaping status: Never Used  Substance and Sexual Activity   Alcohol use: No   Drug use: No   Sexual activity: Not Currently  Other Topics Concern   Not on file  Social History Narrative   1 daughter, 2 grandchildren, 1 great grandchild   Social Drivers of Corporate investment banker Strain: Low Risk  (01/12/2022)   Overall Financial Resource Strain (CARDIA)    Difficulty of Paying Living Expenses: Not hard at all  Food Insecurity: No Food Insecurity (01/12/2022)   Hunger Vital Sign    Worried About Running Out of Food in the Last Year: Never true    Ran Out of Food in the Last Year: Never  true  Transportation Needs: No Transportation Needs (01/12/2022)   PRAPARE - Administrator, Civil Service (Medical): No    Lack of Transportation (Non-Medical): No  Physical Activity: Sufficiently Active (01/12/2022)   Exercise Vital Sign    Days of Exercise per Week: 5 days    Minutes of Exercise per Session: 30 min  Stress: No Stress Concern Present (01/12/2022)   [employer] of Occupational Health - Occupational Stress Questionnaire    Feeling of Stress : Not at all  Social Connections: Socially Integrated (01/12/2022)   Social Connection and Isolation Panel [NHANES]    Frequency of Communication with Friends and Family: More than three times a week    Frequency of Social Gatherings with Friends and Family: More than three times a week    Attends Religious Services: More than 4 times per year    Active Member of [employer] or Organizations: Yes    Attends Banker Meetings: More than 4 times per year    Marital Status: Married  Catering manager Violence: Not At Risk (01/12/2022)   Humiliation, Afraid, Rape, and Kick questionnaire    Fear of Current or Ex-Partner: No    Emotionally Abused: No    Physically Abused: No    Sexually Abused: No      Review of Systems  All other systems reviewed and are negative.      Objective:   Physical Exam Vitals reviewed.  Constitutional:      Appearance: He is well-developed.  Neck:     Thyroid: No thyromegaly.     Vascular: No JVD.  Cardiovascular:     Rate and Rhythm: Normal rate and regular rhythm.     Heart sounds: Normal heart sounds. No murmur heard. Pulmonary:     Effort: Pulmonary effort is normal. No respiratory distress.     Breath sounds: Normal breath sounds. No wheezing or rales.  Abdominal:     General: Bowel sounds are normal. There is no distension.     Palpations: Abdomen is soft.     Tenderness: There is no abdominal tenderness. There is no guarding or rebound.           Assessment &  Plan:  Type 2 diabetes mellitus with other specified complication, without long-term current use of insulin (HCC) - Plan: Hemoglobin A1c, CBC with Differential/Platelet, COMPLETE METABOLIC PANEL WITHOUT GFR, Lipid panel, Microalbumin/Creatinine Ratio, Urine  Benign essential HTN Blood pressure today is well-controlled.  We will check lab work.  If A1c is still greater than 7 I gave the patient option of adding Januvia to Jardiance and metformin versus replacing Jardiance with Mounjaro.  I would  elect to replace Jardiance with Mounjaro.  The patient is comfortable with this.  Await the results of his lab work.  However his reported blood sugars in the 117 range sound well-controlled.  Diabetic foot exam is normal

## 2023-05-14 LAB — CBC WITH DIFFERENTIAL/PLATELET
Absolute Lymphocytes: 2199 {cells}/uL (ref 850–3900)
Absolute Monocytes: 617 {cells}/uL (ref 200–950)
Basophils Absolute: 50 {cells}/uL (ref 0–200)
Basophils Relative: 0.8 %
Eosinophils Absolute: 170 {cells}/uL (ref 15–500)
Eosinophils Relative: 2.7 %
HCT: 47.3 % (ref 38.5–50.0)
Hemoglobin: 15.6 g/dL (ref 13.2–17.1)
MCH: 30.9 pg (ref 27.0–33.0)
MCHC: 33 g/dL (ref 32.0–36.0)
MCV: 93.7 fL (ref 80.0–100.0)
MPV: 11.5 fL (ref 7.5–12.5)
Monocytes Relative: 9.8 %
Neutro Abs: 3263 {cells}/uL (ref 1500–7800)
Neutrophils Relative %: 51.8 %
Platelets: 202 10*3/uL (ref 140–400)
RBC: 5.05 10*6/uL (ref 4.20–5.80)
RDW: 12.7 % (ref 11.0–15.0)
Total Lymphocyte: 34.9 %
WBC: 6.3 10*3/uL (ref 3.8–10.8)

## 2023-05-14 LAB — LIPID PANEL
Cholesterol: 109 mg/dL (ref <200)
HDL: 42 mg/dL (ref >=40)
LDL Cholesterol (Calc): 47 mg/dL
Non-HDL Cholesterol (Calc): 67 mg/dL (ref <130)
Total CHOL/HDL Ratio: 2.6 (calc) (ref <5.0)
Triglycerides: 123 mg/dL (ref <150)

## 2023-05-14 LAB — COMPLETE METABOLIC PANEL WITHOUT GFR
AG Ratio: 1.7 (calc) (ref 1.0–2.5)
ALT: 43 U/L (ref 9–46)
AST: 33 U/L (ref 10–35)
Albumin: 4.4 g/dL (ref 3.6–5.1)
Alkaline phosphatase (APISO): 61 U/L (ref 35–144)
BUN: 13 mg/dL (ref 7–25)
CO2: 29 mmol/L (ref 20–32)
Calcium: 9.6 mg/dL (ref 8.6–10.3)
Chloride: 102 mmol/L (ref 98–110)
Creat: 0.83 mg/dL (ref 0.70–1.28)
Globulin: 2.6 g/dL (ref 1.9–3.7)
Glucose, Bld: 139 mg/dL — ABNORMAL HIGH (ref 65–99)
Potassium: 4.4 mmol/L (ref 3.5–5.3)
Sodium: 138 mmol/L (ref 135–146)
Total Bilirubin: 0.9 mg/dL (ref 0.2–1.2)
Total Protein: 7 g/dL (ref 6.1–8.1)

## 2023-05-14 LAB — MICROALBUMIN / CREATININE URINE RATIO
Creatinine, Urine: 58 mg/dL (ref 20–320)
Microalb Creat Ratio: 7 mg/g{creat} (ref <30)
Microalb, Ur: 0.4 mg/dL

## 2023-05-14 LAB — HEMOGLOBIN A1C
Hgb A1c MFr Bld: 7.1 %{Hb} — ABNORMAL HIGH (ref <5.7)
Mean Plasma Glucose: 157 mg/dL
eAG (mmol/L): 8.7 mmol/L

## 2023-06-19 ENCOUNTER — Ambulatory Visit

## 2023-07-17 ENCOUNTER — Ambulatory Visit

## 2023-07-17 VITALS — Ht 66.0 in | Wt 210.0 lb

## 2023-07-17 DIAGNOSIS — Z0000 Encounter for general adult medical examination without abnormal findings: Secondary | ICD-10-CM

## 2023-07-17 NOTE — Patient Instructions (Signed)
 Patrick Luna , Thank you for taking time out of your busy schedule to complete your Annual Wellness Visit with me. I enjoyed our conversation and look forward to speaking with you again next year. I, as well as your care team,  appreciate your ongoing commitment to your health goals. Please review the following plan we discussed and let me know if I can assist you in the future. Your Game plan/ To Do List    Follow up Visits: Next Medicare AWV with our clinical staff: In 1 year    Have you seen your provider in the last 6 months (3 months if uncontrolled diabetes)? Yes Next Office Visit with your provider: To be scheduled  Clinician Recommendations:  Aim for 30 minutes of exercise or brisk walking, 6-8 glasses of water , and 5 servings of fruits and vegetables each day. =      This is a list of the screening recommended for you and due dates:  Health Maintenance  Topic Date Due   Zoster (Shingles) Vaccine (1 of 2) 08/12/2023*   Flu Shot  09/13/2023   Eye exam for diabetics  10/25/2023   Hemoglobin A1C  11/12/2023   Yearly kidney function blood test for diabetes  05/12/2024   Yearly kidney health urinalysis for diabetes  05/12/2024   Complete foot exam   05/12/2024   Medicare Annual Wellness Visit  07/16/2024   Pneumonia Vaccine  Completed   Hepatitis C Screening  Completed   HPV Vaccine  Aged Out   Meningitis B Vaccine  Aged Out   DTaP/Tdap/Td vaccine  Discontinued   Colon Cancer Screening  Discontinued   COVID-19 Vaccine  Discontinued  *Topic was postponed. The date shown is not the original due date.    Advanced directives: (ACP Link)Information on Advanced Care Planning can be found at [place]  [employer] of [employer] Advance Health Care Directives Advance Health Care Directives. [email or web address]   Advance Care Planning is important because it:  [x]  Makes sure you receive the medical care that is consistent with your values, goals, and preferences  [x]  It provides guidance to your  family and loved ones and reduces their decisional burden about whether or not they are making the right decisions based on your wishes.  Follow the link provided in your after visit summary or read over the paperwork we have mailed to you to help you started getting your Advance Directives in place. If you need assistance in completing these, please reach out to us  so that we can help you!  See attachments for Preventive Care and Fall Prevention Tips.

## 2023-07-17 NOTE — Progress Notes (Signed)
 Subjective:   Patrick Luna is a 78 y.o. who presents for a Medicare Wellness preventive visit.  As a reminder, Annual Wellness Visits don't include a physical exam, and some assessments may be limited, especially if this visit is performed virtually. We may recommend an in-person follow-up visit with your provider if needed.  Visit Complete: Virtual I connected with  Montgomery Apgar on 07/17/23 by a audio enabled telemedicine application and verified that I am speaking with the correct person using two identifiers.  Patient Location: Home  Provider Location: Home Office  I discussed the limitations of evaluation and management by telemedicine. The patient expressed understanding and agreed to proceed.  Vital Signs: Because this visit was a virtual/telehealth visit, some criteria may be missing or patient reported. Any vitals not documented were not able to be obtained and vitals that have been documented are patient reported.  VideoDeclined- This patient declined Librarian, academic. Therefore the visit was completed with audio only.  Persons Participating in Visit: Patient.  AWV Questionnaire: No: Patient Medicare AWV questionnaire was not completed prior to this visit.  Cardiac Risk Factors include: advanced age (>79men, >6 women);diabetes mellitus;dyslipidemia;hypertension;male gender     Objective:     Today's Vitals   07/17/23 1430  Weight: 210 lb (95.3 kg)  Height: 5\' 6"  (1.676 m)   Body mass index is 33.89 kg/m.     07/17/2023    2:36 PM 01/12/2022    8:59 AM 12/08/2020    2:21 PM 10/10/2019    4:50 AM 10/04/2019   12:53 PM 09/06/2017    2:53 PM 07/20/2013    2:47 PM  Advanced Directives  Does Patient Have a Medical Advance Directive? No Yes Yes No No No Patient does not have advance directive;Patient would not like information  Type of Customer service manager Power of [place];Living will Healthcare Power of [place];Living  will      Copy of Healthcare Power of Attorney in Chart?  No - copy requested No - copy requested      Would patient like information on creating a medical advance directive? Yes (MAU/Ambulatory/Procedural Areas - Information given)   No - Patient declined No - Patient declined --     Current Medications (verified) Outpatient Encounter Medications as of 07/17/2023  Medication Sig   Ascorbic Acid (VITAMIN C) 1000 MG tablet Take 1,000 mg by mouth daily.   aspirin  EC 81 MG tablet Take 81 mg by mouth daily.   atorvastatin  (LIPITOR) 20 MG tablet Take 1 tablet (20 mg total) by mouth daily.   empagliflozin  (JARDIANCE ) 25 MG TABS tablet Take 1 tablet (25 mg total) by mouth daily.   glucose blood (ONETOUCH VERIO) test strip Use as instructed to check blood sugars up to 4 times per day.   Lancets (FREESTYLE) lancets Check BS BID - DX: E11.9   lisinopril  (ZESTRIL ) 10 MG tablet Take 1 tablet (10 mg total) by mouth daily.   metFORMIN  (GLUCOPHAGE -XR) 500 MG 24 hr tablet TAKE 4 TABLETS BY MOUTH EVERY DAY WITH BREAKFAST   Vitamin D3 (VITAMIN D) 25 MCG tablet Take 2,000 Units by mouth daily.   No facility-administered encounter medications on file as of 07/17/2023.    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[facility], botox, dilation, isosorbide    Past Surgical History:  Procedure Laterality Date   COLONOSCOPY  2007   CYSTECTOMY     BENIGN CYST REMOVAL FROM RIGHT SHOULDER   ESOPHAGEAL MANOMETRY N/A 02/02/2013   Procedure: ESOPHAGEAL MANOMETRY (EM);  Surgeon: Brice Campi, MD;  Location: [facility];  Service: Endoscopy;  Laterality: N/A;   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 07/23/2013   Procedure: RIGHT FOOT: EXCISION PARTIAL BONE TALUS/CALCANEUS,  REPAIR RUPTURE  ACHILLES TENDON PRIMARY OPEN/PERCUTANEOUS;  Surgeon: Ferd Householder, MD;  Location: [facility];  Service: Orthopedics;  Laterality: Right;   HAND DEBRIDEMENT  2009   left   Family History  Problem Relation Age of Onset   Hypertension Mother    Hepatitis C Sister    Social History   Socioeconomic History   Marital status: Married    Spouse name: Lamond Pilot   Number of children: 1   Years of education: Not on file   Highest education level: Not on file  Occupational History   Not on file  Tobacco Use   Smoking status: Former    Current packs/day: 0.00    Types: Cigarettes    Quit date: 11/27/1968    Years since quitting: 54.6   Smokeless tobacco: Never   Tobacco comments:    QUIT 1970  Vaping Use   Vaping status: Never Used  Substance and Sexual Activity   Alcohol use: No   Drug use: No   Sexual activity: Not Currently  Other Topics Concern   Not on file  Social History Narrative   1 daughter, 2 grandchildren, 1 great grandchild   Social Drivers of Corporate investment banker Strain: Low Risk  (07/17/2023)   Overall Financial Resource Strain (CARDIA)    Difficulty of Paying Living Expenses: Not hard at all  Food Insecurity: No Food Insecurity (07/17/2023)   Hunger Vital Sign    Worried About Running Out of Food in the Last Year: Never true    Ran Out of Food in the Last Year: Never true  Transportation Needs: No Transportation Needs (07/17/2023)   PRAPARE - Administrator, Civil Service (Medical): No    Lack of Transportation (Non-Medical): No  Physical Activity: Sufficiently Active (07/17/2023)   Exercise Vital Sign    Days of Exercise per Week: 5 days    Minutes of Exercise per Session: 30 min  Stress: No Stress Concern Present (07/17/2023)   [employer] of Occupational Health - Occupational Stress Questionnaire    Feeling of Stress : Not at all  Social Connections: Socially Integrated (07/17/2023)   Social Connection and  Isolation Panel [NHANES]    Frequency of Communication with Friends and Family: More than three times a week    Frequency of Social Gatherings with Friends and Family: Three times a week    Attends Religious Services: More than 4 times per year    Active Member of Clubs or Organizations: Yes    Attends Banker Meetings: More than 4 times per year    Marital Status: Married    Tobacco Counseling Counseling given: Not Answered Tobacco comments: QUIT 1970    Clinical Intake:  Pre-visit preparation completed: Yes  Pain : No/denies pain  Diabetes: No  Lab Results  Component Value Date   HGBA1C 7.1 (H) 05/13/2023   HGBA1C 7.7 (H) 12/20/2022   HGBA1C 6.4 (H) 09/28/2021     How often do you need to have someone help  you when you read instructions, pamphlets, or other written materials from your doctor or pharmacy?: 1 - Never  Interpreter Needed?: No  Information entered by :: Seabron Cypress LPN   Activities of Daily Living     07/17/2023    2:32 PM  In your present state of health, do you have any difficulty performing the following activities:  Hearing? 0  Vision? 0  Difficulty concentrating or making decisions? 0  Walking or climbing stairs? 0  Dressing or bathing? 0  Doing errands, shopping? 0  Preparing Food and eating ? N  Using the Toilet? N  In the past six months, have you accidently leaked urine? N  Do you have problems with loss of bowel control? N  Managing your Medications? N  Managing your Finances? N  Housekeeping or managing your Housekeeping? N    Patient Care Team: Austine Lefort, MD as PCP - General (Family Medicine) Gerard Knight, MD as Consulting Physician (Cardiology) Myrle Aspen, [facility] (Inactive) as Pharmacist (Pharmacist) [facility] Christina Coyer, MD as Consulting Physician (Urology) Drusilla Gerlach, MD as Consulting Physician (Dermatology)  I have updated your Care Teams any recent Medical  Services you may have received from other providers in the past year.     Assessment:    This is a routine wellness examination for [employer].  Hearing/Vision screen Hearing Screening - Comments:: Denies hearing difficulties   Vision Screening - Comments:: Wears rx glasses - up to date with routine eye exams with [facility]    Goals Addressed             This Visit's Progress    DIET - INCREASE WATER  INTAKE   On track      Depression Screen     07/17/2023    2:34 PM 05/13/2023    2:04 PM 02/01/2023   11:47 AM 12/20/2022    2:07 PM 01/12/2022    8:57 AM 09/28/2021    2:09 PM 12/08/2020    2:12 PM  PHQ 2/9 Scores  PHQ - 2 Score 0 0 0 0 0 0 0    Fall Risk     07/17/2023    2:36 PM 05/13/2023    2:04 PM 02/01/2023   11:47 AM 12/20/2022    2:06 PM 01/12/2022    9:00 AM  Fall Risk   Falls in the past year? 0 0 0 0 1  Number falls in past yr: 0 0 0 0 0  Injury with Fall? 0 0 0 0 0  Risk for fall due to : No Fall Risks No Fall Risks  No Fall Risks History of fall(s);Impaired balance/gait;Orthopedic patient  Follow up Falls prevention discussed;Education provided;Falls evaluation completed Falls prevention discussed;Falls evaluation completed  Falls prevention discussed Education provided;Falls prevention discussed    MEDICARE RISK AT HOME:  Medicare Risk at Home Any stairs in or around the home?: No If so, are there any without handrails?: No Home free of loose throw rugs in walkways, pet beds, electrical cords, etc?: Yes Adequate lighting in your home to reduce risk of falls?: Yes Life alert?: No Use of a cane, walker or w/c?: No Grab bars in the bathroom?: Yes Shower chair or bench in shower?: No Elevated toilet seat or a handicapped toilet?: Yes  TIMED UP AND GO:  Was the test performed?  No  Cognitive Function: 6CIT completed        07/17/2023    2:36 PM 01/12/2022    9:01  AM 12/08/2020    2:26 PM  6CIT Screen  What Year? 0 points 0 points 0 points  What  month? 0 points 0 points 0 points  What time? 0 points 0 points 0 points  Count back from 20 0 points 0 points 0 points  Months in reverse 0 points 0 points 0 points  Repeat phrase 0 points 0 points 0 points  Total Score 0 points 0 points 0 points    Immunizations Immunization History  Administered Date(s) Administered   Influenza,inj,Quad PF,6+ Mos 12/10/2014   Pneumococcal Conjugate-13 06/18/2016   Pneumococcal Polysaccharide-23 12/10/2014    Screening Tests Health Maintenance  Topic Date Due   Zoster Vaccines- Shingrix (1 of 2) 08/12/2023 (Originally 01/[birth year]/1997)   INFLUENZA VACCINE  09/13/2023   OPHTHALMOLOGY EXAM  10/25/2023   HEMOGLOBIN A1C  11/12/2023   Diabetic kidney evaluation - eGFR measurement  05/12/2024   Diabetic kidney evaluation - Urine ACR  05/12/2024   FOOT EXAM  05/12/2024   Medicare Annual Wellness (AWV)  07/16/2024   Pneumonia Vaccine 60+ Years old  Completed   Hepatitis C Screening  Completed   HPV VACCINES  Aged Out   Meningococcal B Vaccine  Aged Out   DTaP/Tdap/Td  Discontinued   Colonoscopy  Discontinued   COVID-19 Vaccine  Discontinued    Health Maintenance  There are no preventive care reminders to display for this patient.   Additional Screening:  Vision Screening: Recommended annual ophthalmology exams for early detection of glaucoma and other disorders of the eye. Would you like a referral to an eye doctor? No    Dental Screening: Recommended annual dental exams for proper oral hygiene  Community Resource Referral / Chronic Care Management: CRR required this visit?  No   CCM required this visit?  No   Plan:    I have personally reviewed and noted the following in the patient's chart:   Medical and social history Use of alcohol, tobacco or illicit drugs  Current medications and supplements including opioid prescriptions. Patient is not currently taking opioid prescriptions. Functional ability and status Nutritional  status Physical activity Advanced directives List of other physicians Hospitalizations, surgeries, and ER visits in previous 12 months Vitals Screenings to include cognitive, depression, and falls Referrals and appointments  In addition, I have reviewed and discussed with patient certain preventive protocols, quality metrics, and best practice recommendations. A written personalized care plan for preventive services as well as general preventive health recommendations were provided to patient.   Seabron Cypress [place], [place]   10/18/6211   After Visit Summary: (MyChart) Due to this being a telephonic visit, the after visit summary with patients personalized plan was offered to patient via MyChart   Notes: Nothing significant to report at this time.

## 2023-07-22 ENCOUNTER — Other Ambulatory Visit: Payer: Self-pay | Admitting: Family Medicine

## 2023-07-22 ENCOUNTER — Other Ambulatory Visit: Payer: Self-pay

## 2023-07-22 DIAGNOSIS — E119 Type 2 diabetes mellitus without complications: Secondary | ICD-10-CM

## 2023-07-22 MED ORDER — METFORMIN HCL ER 500 MG PO TB24: ORAL_TABLET | ORAL | 1 refills | Status: AC

## 2023-07-23 ENCOUNTER — Telehealth: Payer: Self-pay | Admitting: Family Medicine

## 2023-07-23 NOTE — Telephone Encounter (Signed)
 Patient stopped by requesting that we please update his pharmacy to [facility]. He states that they made this switch a couple of months ago but are still getting calls from [facility] stating prescription is ready. He was contacted by them yesterday for his metformin , he did pick it up but would like his pharmacy updated.

## 2023-07-23 NOTE — Telephone Encounter (Signed)
 I have updated pharmacy and added a sticky note for others to see that patient has changed pharmacy.

## 2023-11-07 ENCOUNTER — Other Ambulatory Visit: Payer: Self-pay | Admitting: Family Medicine

## 2023-11-07 DIAGNOSIS — E1169 Type 2 diabetes mellitus with other specified complication: Secondary | ICD-10-CM

## 2023-11-13 DIAGNOSIS — L570 Actinic keratosis: Secondary | ICD-10-CM | POA: Diagnosis not present

## 2023-11-13 DIAGNOSIS — L821 Other seborrheic keratosis: Secondary | ICD-10-CM | POA: Diagnosis not present

## 2023-11-13 DIAGNOSIS — Z85828 Personal history of other malignant neoplasm of skin: Secondary | ICD-10-CM | POA: Diagnosis not present

## 2023-11-19 ENCOUNTER — Telehealth: Payer: Self-pay | Admitting: Family Medicine

## 2023-11-19 ENCOUNTER — Other Ambulatory Visit: Payer: Self-pay

## 2023-11-19 DIAGNOSIS — E119 Type 2 diabetes mellitus without complications: Secondary | ICD-10-CM

## 2023-11-19 MED ORDER — ONETOUCH VERIO VI STRP: ORAL_STRIP | 6 refills | Status: DC

## 2023-11-19 NOTE — Telephone Encounter (Signed)
 Prescription Request  11/19/2023  LOV: 05/13/2023  What is the name of the medication or equipment?   glucose blood (ONETOUCH VERIO) test strips  **Pharmacy waiting for call to approve refill request**  Have you contacted your pharmacy to request a refill? Yes   Which pharmacy would you like this sent to?  [facility] - [place], [place] - [address] [place] [address] Phone: [phone] Fax: [phone]    Patient notified that their request is being sent to the clinical staff for review and that they should receive a response within 2 business days.   Please advise patient at [phone]

## 2024-01-25 ENCOUNTER — Other Ambulatory Visit: Payer: Self-pay | Admitting: Family Medicine

## 2024-01-25 DIAGNOSIS — E1169 Type 2 diabetes mellitus with other specified complication: Secondary | ICD-10-CM

## 2024-02-20 ENCOUNTER — Other Ambulatory Visit: Payer: Self-pay | Admitting: Family Medicine

## 2024-02-20 DIAGNOSIS — E1169 Type 2 diabetes mellitus with other specified complication: Secondary | ICD-10-CM

## 2024-02-25 ENCOUNTER — Telehealth: Payer: Self-pay | Admitting: Family Medicine

## 2024-02-25 ENCOUNTER — Other Ambulatory Visit: Payer: Self-pay

## 2024-02-25 DIAGNOSIS — E1169 Type 2 diabetes mellitus with other specified complication: Secondary | ICD-10-CM

## 2024-02-25 MED ORDER — BLOOD GLUCOSE TEST VI STRP: 1.0000 | ORAL_STRIP | 5 refills | Status: AC

## 2024-02-25 MED ORDER — LANCET DEVICE MISC: 1.0000 | 0 refills | Status: AC

## 2024-02-25 MED ORDER — LANCETS MISC: 1.0000 | 3 refills | Status: AC

## 2024-02-25 MED ORDER — BLOOD GLUCOSE MONITORING SUPPL DEVI: 1.0000 | 0 refills | Status: AC

## 2024-02-25 NOTE — Telephone Encounter (Unsigned)
 Copied from CRM [id number]. Topic: Clinical - Prescription Issue >> Feb 25, 2024 10:33 AM Patrick Luna wrote: Reason for CRM: Patients insurance has changed and needs a new prescription for an Accucheck glucose monitor, test strip and lancets.  Would like them sent to: [facility] - [place], [place] - [address] [place] [address] Phone: [phone] Fax: [phone]  Andrea from [facility] can be reached at [phone]

## 2024-02-27 ENCOUNTER — Other Ambulatory Visit (HOSPITAL_COMMUNITY): Payer: Self-pay

## 2024-07-22 ENCOUNTER — Ambulatory Visit
